# Patient Record
Sex: Female | Born: 1990 | Race: Black or African American | Hispanic: No | Marital: Single | Smoking: Current every day smoker
Health system: Southern US, Community
[De-identification: ages and names within clinical notes are randomized; demographics above are authoritative.]

## PROBLEM LIST (undated history)

## (undated) DIAGNOSIS — M1990 Unspecified osteoarthritis, unspecified site: Secondary | ICD-10-CM

## (undated) DIAGNOSIS — J45909 Unspecified asthma, uncomplicated: Secondary | ICD-10-CM

## (undated) DIAGNOSIS — Z973 Presence of spectacles and contact lenses: Secondary | ICD-10-CM

## (undated) DIAGNOSIS — N63 Unspecified lump in unspecified breast: Secondary | ICD-10-CM

## (undated) DIAGNOSIS — G43909 Migraine, unspecified, not intractable, without status migrainosus: Secondary | ICD-10-CM

## (undated) DIAGNOSIS — G43019 Migraine without aura, intractable, without status migrainosus: Secondary | ICD-10-CM

## (undated) DIAGNOSIS — F329 Major depressive disorder, single episode, unspecified: Secondary | ICD-10-CM

## (undated) DIAGNOSIS — Z8489 Family history of other specified conditions: Secondary | ICD-10-CM

## (undated) DIAGNOSIS — B019 Varicella without complication: Secondary | ICD-10-CM

## (undated) DIAGNOSIS — F32A Depression, unspecified: Secondary | ICD-10-CM

## (undated) HISTORY — DX: Migraine without aura, intractable, without status migrainosus: G43.019

## (undated) HISTORY — DX: Depression, unspecified: F32.A

## (undated) HISTORY — DX: Major depressive disorder, single episode, unspecified: F32.9

## (undated) HISTORY — DX: Unspecified asthma, uncomplicated: J45.909

## (undated) HISTORY — DX: Migraine, unspecified, not intractable, without status migrainosus: G43.909

## (undated) HISTORY — DX: Varicella without complication: B01.9

## (undated) HISTORY — PX: DILATION AND CURETTAGE OF UTERUS: SHX78

## (undated) NOTE — ED Provider Notes (Signed)
 Associated Order(s): Incision/Drainage Post-Procedure Diagnose(s): Paronychia of left thumb Formatting of this note is different from the original. Chief Complaint: Wound Dehiscence  Subjective   Patient is a 80 year old otherwise healthy female presenting with swelling to the right thumb.  Patient states she noticed some blistering and swelling just proximal to the nailbed of the right thumb.  She states that she has had increased swelling.  There is a little mild surrounding erythema.  Full range of motion into flexion extension of the thumb.   Past Medical History[1]  Past Surgical History[2] Family History[3]  Social History[4]  No Known Allergies  Review of Systems  Objective  Vitals   ED Triage Vitals BP: (!) 143/93 [09/03/24 0000] Heart Rate: 77 [09/03/24 0000] Respiratory Rate: 18 [09/03/24 0000] Temp: 36.7 C (98 F) [09/03/24 0000] Temp src: Oral [09/03/24 0000] SpO2: 100 % [09/03/24 0000] Weight: 51.1 kg (112 lb 10.5 oz) [09/03/24 0000] Height: 1.626 m (5' 4) [09/03/24 0006]  Physical Exam  Results Review  Assessment   Medical Decision Making:  Differential Diagnoses: Paronychia, cellulitis, flexor/extensor tenosynovitis  Physical exam is consistent with paronychia.  Incision and drainage was performed.  There was a large amount of pus.  Patient given prescription for Keflex.  Counseled to call primary care for follow-up and to return with new or worsening symptoms.  Patient is in agreement with this treatment plan                       I have reviewed all results of tests, assessments, and evaluations and incorporated results into my plan of care. All patient complaints have been evaluated and addressed in my plan of care. I have assessed whether each complaint is an Emergency Medical Condition and whether the Emergency Medical Condition has been stabilized or resolved.  Final diagnoses:  Paronychia of left thumb   Plan    Disposition:  Discharge I attest to having provided the patient with a Medical Screening Examination, including necessary testing or consultation, to determine whether an Emergency Medical Condition was present.  I further attest that any Emergency Medical Condition has been now stabilized or resolved with an appropriate discharge plan in place.    New Prescriptions   CEPHALEXIN (KEFLEX) 500 MG CAPSULE    Take 1 (one) capsule (500 mg total) by mouth 4 (four) times daily for 7 days.      Start Date: 10/26/2025End Date: 09/10/2024      Order Dose: 500 mg      Quantity: 28 capsule    Refills: 0     Additional Documentation  ED Lab and Imaging Orders   Incision/Drainage  Date/Time: 09/03/2024 12:17 AM  Performed by: Bernardo Rankin PARAS, DO Authorized by: Bernardo Rankin PARAS, DO   Consent:    Consent obtained:  Verbal   Consent given by:  Patient   Risks discussed:  Bleeding, incomplete drainage and infection   Alternatives discussed:  No treatment Universal protocol:    Site/side marked: yes     Immediately prior to procedure, a time out was called: yes     Patient identity confirmed:  Verbally with patient Location:    Type:  Abscess   Location:  Upper extremity   Upper extremity location:  Hand   Hand location:  L hand Pre-procedure details:    Skin preparation:  Povidone-iodine  Sedation:    Sedation type:  None Anesthesia:    Anesthesia method:  Topical application Procedure type:    Complexity:  Simple Procedure details:    Incision types:  Stab incision   Incision depth:  Dermal   Drainage:  Purulent   Drainage amount:  Moderate   Wound treatment:  Wound left open Post-procedure details:    Procedure completion:  Tolerated well, no immediate complications     [1] No past medical history on file. [2]  Past Surgical History: Procedure Laterality Date   Esophagogastroduodenoscopy Bilateral 05/19/2024   Dr. Jerl   Uterine fibroid surgery    [3] History reviewed. No pertinent  family history. [4]  Social History Tobacco Use   Smoking status: Never   Smokeless tobacco: Never  Substance Use Topics   Alcohol use: Never   Drug use: Never    Bernardo Rankin PARAS, DO 09/03/24 0018  Electronically signed by Bernardo Rankin PARAS, DO at 09/03/2024 12:18 AM CDT

## (undated) NOTE — Progress Notes (Signed)
 Formatting of this note might be different from the original. Received call from [facility] and was given a verbal order to DC NG tube.  Electronically signed by Flossie Gilberto LABOR, RN at 05/19/2024 10:56 AM CDT

## (undated) NOTE — Nursing Note (Signed)
 Formatting of this note might be different from the original. AVS reviewed with patient via audio/visual virtual visit. Pt alert and interactive with teaching. Discussed medications and follow up appointments with PCP & specialists as per AVS. Pt verbalized understanding, denies further questions regarding discharge.  Bedside nurse notified of above information. Electronically signed by Renda Alfonso RAMAN, RN at 05/21/2024  1:17 PM CDT

## (undated) NOTE — OR Surgeon (Signed)
 Formatting of this note might be different from the original. Endoscopic Gastroduodenoscopy Procedure Note  Name: Kaeleen Odom MRN: [id number]     CSN: [id number] DOB:  1991/07/03  Procedure date/time: 05/19/2024 1125  Admission Date/Time: 05/18/2024  6:29 AM Primary Care Provider / Referring Physician:  Patient, None Per  Surgeon / Proceduralist: Jennette Ireland, MD Assistant(s):  Sedation/Monitoring Nurse: Crecencio Child, RN Endoscopy Nurse: Hoy Asberry LABOR, RN  Preop / Preprocedure Diagnosis:  GASTRIC OUTLET OBSTRUCTION Postop / Postprocedure Diagnosis:  GE JUNCTION ULCERS  ASA Class: 1  Anesthesia Type:  Conscious Sedation: No Local  Mallampati scale :1  Medications:  Fentanyl  100 mcg    Midazolam  5 mg and Benzocaine spray   Brief history: Ms Liz is here for a upper endoscopy due to history of nausea and vomiting. A pre procedure physical exam was performed and it was within normal limits for the patient. His medications, allergies and medical history was reviewed before the procedure.   Procedure:  The procedure, risks, benefits and alternatives to the procedure are explained to the patient. Informed consent was obtained for the procedure, including conscious sedation. The complications explained included but not limited to infection, perforation, hemorrhage, adverse drug reaction to conscious sedation and aspiration.  Pre-procedure time out was performed. Patient was placed in left lateral position.The gastroscope was inserted into the mouth and advanced under direct vision to third portion of the duodenum.  A careful inspection was made as the gastroscope was withdrawn, including a retroflexed view; findings and interventions are described below. Appropriate photodocumentation was obtained. Findings are as follows:  Complications:  None; patient tolerated the procedure well.  Condition: stable. Pt was transferred to recovery area.   Findings:  Esophagus :  An ulcer  was noted at the GE junction. Pigmented base. No active bleed.. No strictures or Barrett's esophagus like mucosal changes are noted. No esophageal varices are noted.   GE junction:   GE Junction was noted at 38 cm . No hiatal hernia was noted. GE junction was also examined from the gastric end.   Stomach :   Normal stomach. No outlet obstruction.  Antrum and Body of the stomach was thoroughly examined. Retroflexed view was performed and fundus and gastric cardia appeared normal. No gastric varices are noted.   Duodenum :    No ulcers, erosions or mucosal changes suggestive of celiac disease are noted.     Estimated Blood Loss:  None  Blood Products: None  Specimens: None  Recommendations:    PPI daily  Advance diet  Can discharge home  Follow up as OP in 2 months   Nagendra V Myneni, MD [facility] 05/19/2024 12:04 PM  Electronically signed by Myneni, Nagendra V, MD at 05/19/2024 12:09 PM CDT

## (undated) NOTE — Discharge Summary (Signed)
 Formatting of this note is different from the original. Admit date: 05/18/2024 Discharge date: 05/21/2024 Disposition: Home  Summary of Hospitalization  Reason for Hospitalization: Abdominal pain  Diagnoses:  Principal Problem:   Acute abdominal pain Active Problems:   Nausea   SIRS (systemic inflammatory response syndrome)   Normocytic anemia   Bacterial vaginosis  Resolved Problems:   * No resolved hospital problems. [facility] Course: Hospital Course: 75 year old female admitted 05/18/2024 with nominal pain and exam concerning for gastric outlet obstruction.  GI was consulted.  EGD 05/19/2024 revealed an ulcer at the GE junction.  Patient treated with PPI. Diet was advanced.  Clinical status improved.  Patient was discharged home with a prescription for Protonix 40 mg daily  Procedures: EGD 05/19/2024 revealing an ulcer at the GE junction.  Discharge Plan Pending Results/Tests:   Recommendations/Follow Up: PCP in 3 to 5 days  Patient, None Per  Follow up   Discharge Medication List    New Medications      Sig  metroNIDAZOLE 500 MG tablet Commonly known as: FLAGYL  500 mg, Oral, 2 TIMES DAILY Quant: 14 tablet Refills: 0  pantoprazole 40 MG tablet Commonly known as: PROTONIX  40 mg, Oral, DAILY Quant: 90 tablet Refills: 0     Discharge Procedure Orders  Follow-up primary physician  Order Comments: PCP: Patient, None Per PCP Address: None PCP Phone Number: None   Order Specific Question Answer Comments  When? 3-5 days   After discharge from Hospital    Return to work/school  Order Comments: Kristen Ball was hospitalized from 05/18/2024 to 05/21/2024.  She may return to work on 05/23/2024.  If you have any questions or concerns, please don't hesitate to call.   Immunizations Administered for This Admission     No immunizations administered      Patient seen and examined on day of discharge. Notable exam findings / results:  GEN: NAD CV:  Regular rate and rhythm Pulm: CTAB Abdomen: Soft Extremities: No edema  Results Review  Additional Documentation   I spent 25 minutes coordinating the discharge of this patient.  Electronically signed by Floretta Toribio PARAS, DO at 05/21/2024 10:59 AM CDT

## (undated) NOTE — Progress Notes (Signed)
 Formatting of this note might be different from the original.  Subjective  Interval History: New patient.  Today.  Chart reviewed.  33 year old female admitted 05/18/2024 with nominal pain and exam concerning for gastric outlet obstruction.  GI was consulted.  EGD 05/19/2024 revealed an ulcer at the GE junction.  Patient treated with PPI.  Objective  Vitals  BP 110/79   Pulse 90   Temp 36.8 C (98.2 F)   Resp 12   Ht 162.6 cm (5' 4.02)   Wt 49.9 kg (110 lb)   SpO2 97%   BMI 18.87 kg/m   Physical Exam  General:, NAD CV: Regular rate and rhythm Pulm: CTAB Abd: Generalized tenderness to palpation Ext: No edema  Results Review  Assessment & Plan  Principal Problem:   Acute abdominal pain Active Problems:   Nausea   SIRS (systemic inflammatory response syndrome)   Normocytic anemia   Bacterial vaginosis  Acute abdominal pain, nausea: GI consulted and performed EGD, which revealed ulcer at the gastroesophageal junction.  Continue PPI.  Advance diet.  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 No bleeding symptoms.  This is reportedly near her baseline.  Iron studies 7/11 with iron saturation of 4.  Will order IV Venofer.  Abnormal UA: +LE, WBC on UA. Contaminated sample, many squamous epithelial cells. No urinary symptoms. Not consistent with UTI   Medical readiness for discharge: anticipated 2-4 days due to need to demonstrate clinical improvement, advance diet, treat the iron deficiency.  Additional Documentation    Electronically signed by Floretta Toribio PARAS, DO at 05/19/2024 12:17 PM CDT

## (undated) NOTE — Sedation Documentation (Signed)
 Formatting of this note is different from the original. Procedural Pre-Sedation Assessment  Vital Signs:  Blood pressure 125/77, pulse 87, temperature 36.8 C (98.2 F), resp. rate 16, height 162.6 cm (5' 4.02), weight 49.9 kg (110 lb), SpO2 100%. Body mass index is 18.87 kg/m.   NPO Duration:  8 hours  Meds: Current Meds:  Current Medications[1]   Allergies: Patient has no known allergies.   Pre-Procedure Assessment: Epic documentation is presently reviewed to include; Pre-procedure Nursing Assessment, Problem List, Medication List, Allergy List, PMH, PSH, H&P, Interval H&P, and recent laboratory/radiology assessment.  Exam:   Airway: Normal Heart: See H&P Lungs: See H&P  Mallampati Classification (click on link to view picture) Mallampati Class:  I (soft palate, uvula, fauces, and tonsillar pillars visible)   ASA Physical Status Classification:  ASA 2 - A patient with a mild systemic disease  Medication to be Used for Procedural Sedation:  fentaNYL  (SUBLIMAZE ) injection IV and midazolam  HCl (VERSED ) injection IV  Sedation Plan:    I have discussed in detail the anesthetic plan, planned medications, alternatives, procedures and risks (including (as appropriate) monitoring, pain management, post-op care, blood transfusions, adverse reactions, MI, CVA, organ damage and death.) All questions were answered. The patient, legal guardian or responsible adult consents to proceed.  Final Assessment: I have interviewed and examined the patient as documented within the EHR.  I have reviewed the pre-procedure assessment.  There are no significant changes that affect the risk of the planned procedure.  The chart has been reviewed and patient reassessed immediately prior to the procedure.  Nagendra V Myneni, MD  05/19/2024 11:36 AM     [1]  Current Facility-Administered Medications  Medication Dose Route Frequency Provider Last Rate Last Admin   0.9%  NaCl infusion   Intravenous  Continuous Myneni, Nagendra V, MD 50 mL/hr at 05/19/24 1119 New Bag at 05/19/24 1119   acetaminophen  (TYLENOL ) tablet  650 mg Oral Q6H PRN Krcil, Autumn L, DO   650 mg at 05/18/24 1454   HYDROmorphone  (DILAUDID ) injection  0.2-0.5 mg Intravenous Q3H PRN Krcil, Autumn L, DO   0.5 mg at 05/18/24 1602   lactated ringers  infusion   Intravenous Continuous Krcil, Autumn L, DO   Stopped at 05/19/24 1047   magnesium hydroxide (MILK OF MAGNESIA) 400 MG/5ML suspension  30 mL Oral BID PRN Krcil, Autumn L, DO       melatonin tablet  3 mg Oral Nightly PRN Krcil, Autumn L, DO       metroNIDAZOLE (FLAGYL) tablet  500 mg Oral BID Krcil, Autumn L, DO   500 mg at 05/19/24 0850   naloxone (NARCAN) injection  0.1-0.4 mg Intravenous PRN Krcil, Autumn L, DO       ondansetron  (ZOFRAN -ODT) disintegrating tablet  4 mg Oral Q8H PRN Krcil, Autumn L, DO   4 mg at 05/18/24 1826   Or   ondansetron  HCl (ZOFRAN ) injection  4 mg Intravenous Q8H PRN Krcil, Autumn L, DO   4 mg at 05/18/24 1900   pantoprazole (PROTONIX) injection  40 mg Intravenous Daily Bartlett, Kathryn B, DO   40 mg at 05/19/24 0850   phenol (CHLORASEPTIC) 1.4 % liquid  1 spray Mouth/Throat Q1H PRN Krcil, Autumn L, DO   1 mL at 05/18/24 2107   polyethylene glycol (GLYCOLAX) packet  17 g Oral Daily PRN Krcil, Autumn L, DO       prochlorperazine (COMPAZINE) tablet  10 mg Oral Q6H PRN Krcil, Autumn L, DO  Or   prochlorperazine (COMPAZINE) injection  10 mg Intravenous Q6H PRN Krcil, Autumn L, DO       Electronically signed by Myneni, Nagendra V, MD at 05/19/2024 11:36 AM CDT

## (undated) NOTE — Nursing Note (Signed)
 Formatting of this note might be different from the original. Admission questions reviewed with patient via audio/visual virtual visit. Pt had eyes closed and was mumbling with admission questions. Verified pt's emergency contacts & phone numbers. Education regarding fall precautions, room orientation, hours of cafeteria, meal ordering, and use of hand held & side rail call lights given. Pt updated on plan of care per last physician note.  Pt denies questions at this time.  Bedside nurse notified.    Has a PCP in [place] . She is in [place]  for work and is returning to [place]  in October this year. Pt independent with ADLs and uses no assistive devices. Pt has glasses back at her apartment. Lives in an apartment alone in [place]  and in a house with her sister in [place] . Has a support system and receives no home services. Pt endorses financial and transportation difficulty. Electronically signed by Bari Isaiah BROCKS, RN at 05/18/2024  3:17 PM CDT

## (undated) NOTE — Progress Notes (Signed)
 Formatting of this note is different from the original. GI Progress Note   Subjective  Patient is hungry  Objective   Blood pressure 103/64, pulse 70, temperature 36.5 C (97.7 F), temperature source Oral, resp. rate 18, height 162.6 cm (5' 4.02), weight 49.9 kg (110 lb), SpO2 98%.  Admit Weight: Weight: 49.9 kg (110 lb)  Today's Weight: Weight: 49.9 kg (110 lb)   Weight Change Since Admission: Weight change:  Physical Exam    General Well developed, well nourished, no acute distress   Lungs Clear to auscultation, no crackles, rhonchi, or wheezes   Heart Normal S1 S2, no murmurs, clicks, or gallops   Abdomen Soft, non-tender, non-distended, no palpable HSM or masses, + bowel sounds   Musculoskeletal Appears to have normal range of motion x 4 extremities, no joint swelling. No pedal edema       Diagnostics    Imaging:    No results found for: INR Lab Results  Component Value Date   WBC 10.71 05/20/2024   RBC 3.37 (L) 05/20/2024   HGB 9.5 (L) 05/20/2024   HCT 29.6 (L) 05/20/2024   MCV 87.8 05/20/2024   RDW 12.3 05/20/2024   PLT 200 05/20/2024   Lab Results  Component Value Date   ALT 8 05/19/2024   AST 14 05/19/2024   ALKPHOS 80 05/19/2024   BILITOT 1.1 05/19/2024   No results found for: AMYLASE  Assessment and Plan  She is staying in to get her iron infusion through tomorrow. I am okay with a general diet. Needs d/c on pantoprazole 40mg  daily. She said she will probably be going back to [place] after this so I advised that she see GI in the next 2 months as she needs a repeat EGD to make sure the esophagitis has healed.  Debby LITTIE Lunger, DO 05/20/2024 1:50 PM   Electronically signed by Lunger Debby LITTIE, DO at 05/20/2024  1:55 PM CDT

## (undated) NOTE — ED Notes (Signed)
 Formatting of this note might be different from the original. Patient verbalizes understanding of d/c instructions et f/u care. All questions addressed. Ambulatory at time of d/c. To transport self home.   Electronically signed by Sharmon Rosemary PARAS, RN at 09/03/2024 12:33 AM CDT

## (undated) NOTE — H&P (Signed)
 Formatting of this note is different from the original. Chief Complaint: Abdominal Pain   Subjective  HPI:  6 year old female with unremarkable past medical history who presents to the ED today for acute onset of abdominal pain.  She reports the pain started yesterday.  Has persisted with worsening today.  She has felt very nauseous, although has not had any vomiting.  She gestures to her middle upper abdomen when asked to localize.  No diarrhea.  States that she had a temperature of 100.5 F yesterday.  Nobody around her has been ill.  Returned from a trip to [place]  about 1 week ago, did not eat or drink anything unusual for patient.  Currently, she is not having abdominal pain.  States that she has felt better since the NG was placed.  Notably she has a history of recurrent BV; states that she has chronic vaginal discharge, which has been worse for the past week or 2.  She correlates this with 1 specific sexual partner.  Has been treated with both topical and systemic antibiotics in the past.  She cannot quantify how many times per year she gets treatment, or when her last treatment was.  Tells me she has seen an OB/GYN for this in the past.  She prefers oral treatment if possible.  Brief ED course: Hemodynamically stable and afebrile in the ED.  Labs are remarkable for BC 12.60, Hb 11.5, positive BV on vaginitis panel.  No vomiting in the ED.  CT A/P obtained, demonstrating moderate distention of the stomach consistent with partial gastric outlet obstruction or gastroparesis.  GI was consulted.  NG was placed in the ED.  Internal medicine was called for admission.  Problem List[1] Past Medical History[2] Past Surgical History[3] Family History[4] Social History[5] Current Medications[6]   Allergies[7]   Review of Systems  See above for pertinent findings.  Objective  Vitals   BP 113/79   Pulse 88   Temp 37.1 C (98.7 F) (Oral)   Resp 16   Ht 162.6 cm (5' 4)   Wt 49.9 kg (110 lb)    SpO2 99%   BMI 18.88 kg/m   Physical Exam GEN: Vitals reviewed.  Patient is in no acute distress. HEENT: Normocephalic atraumatic.  Pupils equally round.  Oropharynx clear.  NG in place NECK: Supple; no thyromegaly.   CV: Heart regular in rate and rhythm LUNGS: Lungs clear to auscultation bilaterally. ABD: Soft, mild upper abdominal tenderness to palpation, and nondistended.  Normoactive bowel sounds.  SKIN: Warm and dry to touch.  No rash. EXT: No clubbing or cyanosis.  No peripheral edema.   PSYCH: Affect appropriate.  Alert and oriented to person, place, and time.  Results Review Labs : reviewed, notable for WBC 12.60, Hb 11.5, Tbili 1.4, positive BV on vaginitis panel   CT Abdomen and Pelvis w Contrast Result Date: 05/18/2024 The stomach is moderately distended with oral contrast. Only a small amount of oral contrast has emptied into the small bowel. These findings may be due to partial gastric outlet obstruction or gastroparesis.  ELECTRONICALLY SIGNED BY: Erika FABIENE Hoard, DO 05/18/2024  10:27  Assessment & Plan  Principal Problem:   Acute abdominal pain Active Problems:   Nausea   SIRS (systemic inflammatory response syndrome)   Normocytic anemia   Bacterial vaginosis    Acute abdominal pain, nausea: C/T abd as detailed above, concerning for partial gastric outlet obstruction or gastroparesis.  -GI consulted. Note recommendation for antiemetics with consideration of NG if vomiting. However,  NG already placed in ED. Will maintain for now.  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 Suspect this is more reactive 2/2 abdominal pain and possible  gastric outlet obstruction. +BV although no other infectious source identified and would not expect BV to cause sepsis. Tx of BV as above, no other antibiotics indicated.    Normocytic anemia: Hb 11.4 on admission. No bleeding symptoms. Per review of CareEverywhere, this is consistent with previous Hb readings. Will check iron studies w/ am labs tomorrow.    Abnormal UA: +LE, WBC on UA. Contaminated sample, many squamous epithelial cells. No urinary symptoms. Not consistent with UTI   Inpatient/Observation status: Inpatient: Medical readiness for discharge: anticipated 2-4 days due to possible gastric outlet obstruction, pending GI evaluation/EGD  VTE Prophylaxis: None, low risk Diet: Diet NPO Except Except for: SIPS WITH MEDS Code Status:  Full Code Primary Care Provider:  Patient, None Per  Lionel LITTIE Dubin, DO 05/18/2024 11:48 AM  Additional Documentation       [1]  Patient Active Problem List Diagnosis   Acute abdominal pain   Nausea   SIRS (systemic inflammatory response syndrome)   Normocytic anemia   Bacterial vaginosis  [2] No past medical history on file. [3] History reviewed. No pertinent surgical history. [4] History reviewed. No pertinent family history. [5]   [6]  No current facility-administered medications on file prior to encounter.   No current outpatient medications on file prior to encounter.  [7] No Known Allergies  Electronically signed by Krcil, Autumn L, DO at 05/18/2024  2:01 PM CDT

## (undated) NOTE — Progress Notes (Signed)
 Formatting of this note might be different from the original.  Subjective  No new issues overnight.  Patient still on clear liquid diet this morning.  Objective  Vitals  BP 103/64   Pulse 70   Temp 36.5 C (97.7 F) (Oral)   Resp 18   Ht 162.6 cm (5' 4.02)   Wt 49.9 kg (110 lb)   SpO2 98%   BMI 18.87 kg/m   Physical Exam  General:, NAD CV: Regular rate and rhythm Pulm: CTAB Abd: Generalized tenderness to palpation Ext: No edema  Results Review  Assessment & Plan  Principal Problem:   Acute abdominal pain Active Problems:   Nausea   SIRS (systemic inflammatory response syndrome)   Normocytic anemia   Bacterial vaginosis  Acute abdominal pain, nausea: GI consulted and performed EGD, which revealed ulcer at the gastroesophageal junction.  Continue PPI.  Advancing diet.  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 No bleeding symptoms.  This is reportedly near her baseline.  Iron studies 7/11 with iron saturation of 4.  Will order IV Venofer.  Abnormal UA: +LE, WBC on UA. Contaminated sample, many squamous epithelial cells. No urinary symptoms. Not consistent with UTI   Discharge planning-hopeful discharge tomorrow if she tolerates advancement of the diet. Additional Documentation    Electronically signed by Floretta Toribio PARAS, DO at 05/20/2024 12:36 PM CDT

## (undated) NOTE — ED Notes (Signed)
 Formatting of this note might be different from the original. Report attempted to be called to the floor.  Electronically signed by Larry Charmaine CROME, RN at 05/18/2024  2:04 PM CDT

## (undated) NOTE — Progress Notes (Signed)
 Formatting of this note is different from the original. Medication Reconciliation per Pharmacy Medication reconciliation was performed on Kristen Ball, a 24 y.o., female  Spoke to patient to obtain a list of medications patient is currently taking.   Medication list has been reviewed and updated with the following to note:  OF NOTE: - Patient takes no routine prescription or OTC medications, vitamins, or supplements  Patient's home pharmacy has been updated in Epic.  Patient reports the following allergies: Patient has no known allergies.  Immunization History  Administered Date(s) Administered   Influenza (Flulaval) IIV3, Prefilled Syringe, 6 MONTHS AND OLDER 08/23/2023   Current Home Medication List:  Prior to Admission medications  Not on File    A pharmacist will remain available to provide clarification or further discussion with patient.  Josette MARLA Ore, PharmD  05/18/2024  11:57 AM  Electronically signed by Ore Josette MARLA, PharmD at 05/18/2024 11:58 AM CDT

## (undated) NOTE — ED Notes (Signed)
 Formatting of this note might be different from the original. Coa and self pay done Electronically signed by Mariano Macario HERO at 09/03/2024 12:30 AM CDT

## (undated) NOTE — ED Provider Notes (Signed)
 Formatting of this note is different from the original. Images from the original note were not included.   [facility] Emergency Department   Patient Name: Kristen Ball Patient DOB: 06-18-1991  Attending Provider: Lamarr KATHEE Sequin, DO  Room: [id number]  Primary Care Provider: Patient, None Per Date: 05/18/2024   Chief Complaint   Chief Complaint  Patient presents with   Abdominal Pain    Severe upper abd pain, my whole stomach.  Pain started yesterday afternoon.  Pt also reports vaginal discharge x1 wk with hx of bacterial infections.   History of Present Illness   Mode of Arrival: private car  Historian(s): patient  Treatment PTA: none  The patient is a 81 y.o. female with history of uterine fibroids status post fibroidectomy, recurrent BV who presents to emergency department with the chief complaint of upper abdominal pain.  Patient states she has had periumbilical pain that started yesterday.  Patient notes nausea without vomiting.  Patient noted her temperature yesterday spiked to 100.5.F.  Patient returned from [place]  1 week ago and mostly stated the house did not have any outside activity or fresh water  exposures.  Patient denies any sick contacts.  Patient had a normal bowel movement this morning.  Patient has chronic vaginal discharge and history of BV.  Patient is sexually active however not overly concerned about STDs.  Patient endorses history of fibroidectomy.  Pain has now moved to her upper abdomen.  Patient denies any new vaginal discharge.  Review of Systems   Review of Systems  Constitutional, eyes, ENT, pulmonary, cardiovascular, gastrointestinal, genitourinary, musculoskeletal, neurologic, skin, and psychiatric systems were reviewed and negative unless indicated in the HPI above.  Patient Medical History  Past Medical History[1] Past Surgical History[2] Family History[3] Social History   Socioeconomic History   Marital status:  Single    Spouse name: Not on file   Number of children: Not on file   Years of education: Not on file   Highest education level: Not on file  Occupational History   Not on file  Tobacco Use   Smoking status: Not on file   Smokeless tobacco: Not on file  Substance and Sexual Activity   Alcohol use: Never   Drug use: Not on file   Sexual activity: Not on file  Other Topics Concern   Not on file  Social History Narrative   Not on file   Social Drivers of Health   Financial Resource Strain: High Risk (05/18/2024)   Overall Financial Resource Strain (CARDIA)    Difficulty of Paying Living Expenses: Very hard  Food Insecurity: Food Insecurity Present (05/18/2024)   Hunger Vital Sign    Worried About Running Out of Food in the Last Year: Sometimes true    Ran Out of Food in the Last Year: Sometimes true  Transportation Needs: No Transportation Needs (05/18/2024)   PRAPARE - Administrator, Civil Service (Medical): No    Lack of Transportation (Non-Medical): No  Physical Activity: Not on file  Stress: Not on file  Social Connections: Unknown (03/24/2022)   Received [facility]   Social Network    Social Network: Not on file  Intimate Partner Violence: Not At Risk (05/18/2024)   Humiliation, Afraid, Rape, and Kick questionnaire    Fear of Current or Ex-Partner: No    Emotionally Abused: No    Physically Abused: No    Sexually Abused: No  Housing Stability: High Risk (05/18/2024)   Housing Stability Vital  Sign    Unable to Pay for Housing in the Last Year: Yes    Number of Times Moved in the Last Year: 2    Homeless in the Last Year: No   Allergies[4]  Current Medication List: There are no discharge medications for this patient.  Physical Examination  BP 118/81   Pulse 110   Temp 38.1 C (100.6 F) (Oral)   Resp 17   Ht 162.6 cm (5' 4.02)   Wt 49.9 kg (110 lb)   SpO2 98%   BMI 18.87 kg/m  No LMP recorded.    Physical Exam Constitutional:       General: She is not in acute distress.    Appearance: She is not diaphoretic.  HENT:     Head: Normocephalic and atraumatic.  Neck:     Trachea: No tracheal deviation.   Cardiovascular:     Rate and Rhythm: Normal rate.     Heart sounds: Normal heart sounds. No murmur heard.    No friction rub.  Pulmonary:     Effort: Pulmonary effort is normal. No respiratory distress.     Breath sounds: Normal breath sounds. No wheezing.  Abdominal:     General: There is no distension.     Palpations: Abdomen is soft.     Tenderness: There is generalized abdominal tenderness and tenderness in the right upper quadrant and epigastric area. There is no guarding.  Genitourinary:    Comments: Patient declined pelvic exam  Musculoskeletal:        General: Normal range of motion.     Cervical back: Normal range of motion and neck supple.     Comments: No gross deformities.   Skin:    General: Skin is warm and dry.   Neurological:     Mental Status: She is alert and oriented to person, place, and time.     Comments: Symmetric face. Normal speech.   Data Reviewed   Nursing Notes:  Appreciated, reviewed, and utilized the nursing notes.   Orders Placed in ED: Orders Placed This Encounter  Procedures   Vaginitis Panel =>14   GC/Chlamydia by NAA   Urine culture   CT Abdomen and Pelvis w Contrast   XR Abdomen 1 View   CBC and differential   Comprehensive metabolic panel   Lipase   Urinalysis with reflex microscopic & reflex to culture if indicated (will only reflex to culture if >10 WBC's)   hCG, Qualitative, Urine   Troponin T 5th Gen   Troponin T 5th Gen   Comprehensive metabolic panel   Magnesium   Phosphorus   CBC and differential   Iron and TIBC   Ferritin   Diet NPO Except Except for: SIPS WITH MEDS   Insert Saline Lock   Patient does meet criteria for pregnancy test   Patient DOES NOT Meet Criteria   Vital signs   Intake and output   Up ad lib   Notify physician of  abnormal vital signs   Nasogastric tube to suction   Procedure communication Esophagogastroduodenoscopy under sedation with Dr. Myneni   Nursing consult to Virtual Nurse   Full code   Inpatient consult to GI   EKG 12 lead   Admit to Inpatient   Bed Request   Laboratory Studies: Ordered, reviewed, and independently interpreted by the ED physician Ordered and independently reviewed and interpreted laboratory tests.  Abnormal findings are listed below:  Results for orders placed or performed during the hospital encounter of 05/18/24  CBC and differential   Collection Time: 05/18/24  6:41 AM  Result Value Ref Range   WBC 12.60 (H) 4.00 - 11.00 10*3/uL   RBC 3.96 (L) 4.20 - 5.20 10*6/uL   HGB 11.5 (L) 12.0 - 16.0 g/dL   HCT 65.3 (L) 62.9 - 52.9 %   MCV 87.4 81.0 - 98.0 fL   MCH 29.0 27.0 - 34.0 pg   MCHC 33.2 31.5 - 36.0 g/dL   Platelets 762 849 - 549 10*3/uL   RDW-CV 12.3 9.0 - 14.5 %   NRBC Absolute 0.00 0.0 10*3/uL   MPV 11.0 8.7 - 12.6 fL   Differential Type AUTO    Basophils Absolute 0.03 0.00 - 0.10 10*3/uL   Eosinophils Absolute Count 0.04 0.00 - 0.50 10*3/uL   Lymphocytes Absolute 0.76 (L) 1.20 - 4.00 10*3/uL   Monos Absolute 0.66 0.00 - 1.00 10*3/uL   Neutrophils Absolute 11.07 (H) 1.50 - 8.00 10*3/uL   Basophils % 0.2 %   Eosinophils Relative  % 0.3 %   Immature Granulocytes Absolute 0.04 0.00 - 0.09 10*3/uL   Immature Granulocytes% 0.3 0.0 - 0.6 %   Lymphocytes % 6.0 %   Monocyte % 5.2 %   Neutrophil % 88.0 %  Comprehensive metabolic panel   Collection Time: 05/18/24  6:41 AM  Result Value Ref Range   Sodium 135 (L) 136 - 145 mmol/L   Potassium 4.2 3.5 - 5.1 mmol/L   Chloride 98 98 - 107 mmol/L   CO2 24 22 - 29 mmol/L   Glucose 121 (H) 70 - 99 mg/dL   BUN 6 6 - 20 mg/dL   Creatinine 9.41 (L) 0.6 - 1.1 mg/dL   Calcium 9.4 8.5 - 89.4 mg/dL   Total Protein 7.9 6.4 - 8.3 g/dL   Albumin 4.4 3.5 - 5.2 g/dL   Bilirubin Total 1.4 (H) 0.4 - 1.3 mg/dL   Alkaline  Phosphatase 89 35 - 104 U/L   AST 34 8 - 43 U/L   ALT 11 <33 U/L   Anion Gap 13 mmol/L   BUN/Creatinine Ratio 10.3    Osmolality Calculated 279 275 - 295 mosm/kg   Globulin 3.5 g/dL   A/G Ratio 1.3    Creatinine Based eGFR 122 mL/min/[1.73_m2]  Lipase   Collection Time: 05/18/24  6:41 AM  Result Value Ref Range   Lipase 19 13 - 60 U/L  Troponin T 5th Gen   Collection Time: 05/18/24  6:41 AM  Result Value Ref Range   Troponin T 5th GEN <6 <11 ng/L  Urinalysis with reflex microscopic & reflex to culture if indicated (will only reflex to culture if >10 WBC's)   Collection Time: 05/18/24  6:50 AM  Result Value Ref Range   Specimen, UA CLN CATCH    Color, UA YELLOW YELLOW^YELLOW   Clarity, UA CLOUDY (A) CLEAR^CLEAR   Glucose, UA NEG NEG^NEG mg/dL   Bilirubin, UA NEG NEG^NEG   Ketones, UA 15 (A) NEG^NEG mg/dL   Specific Gravity, UA 1.015 1.005 - 1.030   Blood, UA TRACE (A) NEG^NEG   PH, UA 8.0 5.0 - 8.0   Protein, UA TRACE (A) NEG^NEG mg/dL   Urobilinogen,UA LESS THAN 2 LT2^LESS THAN 2 [Ehrlich'U]/dL   Nitrite, UA NEG NEG^NEG   Leukocyte Esterase, UA MODERATE (A) NEG^NEG   WBC >50 (H) 0 - 10 /[HPF]   RBC 3 (H) 0 - 2 /[HPF]   Bacteria,UR FEW /[HPF]   Squam Epithel, UA MANY >10 (A) F05^FEW (0-5) /[LPF]  hCG, Qualitative, Urine   Collection Time: 05/18/24  6:50 AM  Result Value Ref Range   HCG,UR NEG NEG^NEG  EKG 12 lead   Collection Time: 05/18/24  6:54 AM  Result Value Ref Range   Heart Rate 98 bpm   PR Interval 171 ms   Frontal Axis:P 69 deg   QRS Duration 77 ms   QT Interval 397 ms   QT Corrected 507 ms   Frontal Axis: Mean QRS -20 deg   Frontal Axis: Terminal 40 ms. 6 deg   Frontal Axis Terminal 40ms. -43 deg   Frontal Axis:T -6 deg   Frontal Axis: ST 85 deg  Vaginitis Panel =>14   Collection Time: 05/18/24  7:20 AM   Specimen: Vaginal  Result Value Ref Range   Bacterial Vaginosis POS (A) NEG^NEG   Candida Species NOT DETECTED NDET^NOT DETECTED   Candida  glabrata-krusei NOT DETECTED NDET^NOT DETECTED   Trichomonas NOT DETECTED NDET^NOT DETECTED  Troponin T 5th Gen   Collection Time: 05/18/24  9:25 AM  Result Value Ref Range   Troponin T 5th GEN <6 <11 ng/L    Imaging Studies: Ordered, reviewed, and independently interpreted by the ED physician, please see formal radiology interpretation below. CT Abdomen and Pelvis w Contrast Result Date: 05/18/2024 CT ABDOMEN AND PELVIS WITH INTRAVENOUS CONTRAST  INDICATION: Abdominal pain.  COMPARISON: None.  TECHNIQUE: Multiple axial images of the abdomen and pelvis were performed following the uneventful administration of iopamidol (ISOVUE-300) 61 % injection 100 mL. Reformatted coronal and sagittal images were provided.  *Dose reduction techniques using the adjustment of the mA and/or kV according to patient size and/or use of AEC or iterative reconstruction were used in the acquisition of this exam.*  FINDINGS: The liver enhances homogenously. The portal vein is patent. No radiopaque cholelithiasis. The adrenal glands, spleen and pancreas are unremarkable. The abdominal aorta is normal in caliber.  The kidneys enhance homogenously. No urolithiasis or urinary obstruction. The partially distended urinary bladder is unremarkable. The uterus is not enlarged. Small amount of free fluid in the pelvis is probably physiologic.  No small bowel or colonic dilatation. The stomach is moderately distended with oral contrast. Only a small amount oral contrast has emptied into the nondilated small bowel. The colon is decompressed which limits assessment for wall thickening. No pneumoperitoneum. No acute osseous abnormality. The appendix is normal.    The stomach is moderately distended with oral contrast. Only a small amount of oral contrast has emptied into the small bowel. These findings may be due to partial gastric outlet obstruction or gastroparesis.  ELECTRONICALLY SIGNED BY: Erika FABIENE Hoard, DO 05/18/2024   10:27   EKG: Ordered, reviewed, and independently interpreted by the ED physician ECG Results     Procedure Component Value Ref Range Lab Date/Time   EKG 12 lead [[id number]] DML TRACE Collected: 05/18/24 0654   Order Status: Completed Lab Status: Final result Updated: 05/18/24 0725    Heart Rate 98 bpm DML TRACE     PR Interval 171 ms DML TRACE     Frontal Axis:P 69 deg DML TRACE     QRS Duration 77 ms DML TRACE     QT Interval 397 ms DML TRACE     QT Corrected 507 ms DML TRACE     Frontal Axis: Mean QRS -20 deg DML TRACE     Frontal Axis: Terminal 40 ms. 6 deg DML TRACE     Frontal Axis Terminal 40ms. -43 deg DML TRACE  Frontal Axis:T -6 deg DML TRACE     Frontal Axis: ST 85 deg DML TRACE    Narrative:     Sinus rhythm Borderline left axis deviation Low voltage, precordial leads Consider anterior infarct Prolonged QT interval Confirmed by: Bartlett, Katie B. 18-May-2024 07:22:39      Medical Decision Making   ED Provider Narrative Patient evaluated for abnormal CT of abdomen.  Patient is significantly tender and distended.  Patient found to have gastric outlet obstruction on CT of unclear significance or etiology.  Patient started on IV Protonix.  Patient's pain control with Dilaudid .  Patient requiring multiple antiemetics of IV and oral Zofran  as well as Reglan .  Patient did have one-time vomiting in the emergency department.  Discussed with GI who recommended NG tube to be placed.  Have ordered viscous lidocaine  to improve facilitation of procedure and patient verbalized understanding and agreement with this plan.  Patient subsequently mated with likely follow-up EGD pending improvement of the gastric distention.  Sources of History: Patient Record Review Summary: None on applicable Differential Diagnosis (Includes but not limited to): Appendicitis, pancreatitis, cholecystitis, bowel obstruction Chronic illnesses impacting care: None Social determinants of health  (SDH): Problems related to not applicable Social Drivers of Health with Concerns   Concerns Present  Financial Resource Strain: High Risk (05/18/2024)   Overall Financial Resource Strain (CARDIA)    Difficulty of Paying Living Expenses: Very hard  Housing Stability: High Risk (05/18/2024)   Housing Stability Vital Sign    Unable to Pay for Housing in the Last Year: Yes    Number of Times Moved in the Last Year: 2    Homeless in the Last Year: No  Food Insecurity: Food Insecurity Present (05/18/2024)   Hunger Vital Sign    Worried About Running Out of Food in the Last Year: Sometimes true    Ran Out of Food in the Last Year: Sometimes true  Tobacco Use: High Risk (10/30/2022)   Received from [facility]   Patient History    Smoking Tobacco Use: Every Day    Smokeless Tobacco Use: Never   Unknown Concern   Received from [facility]   Social Network   How SDH impacted patient's care: Not applicable Consideration of admission: Hospitalization considered necessary Prescriptions medications considered: See as above Independent results interpretation: Please see narrative above Discussion with providers: Please see narrative above   Sepsis/Stroke documentation: Not applicable    ED Course   ED Course as of 05/18/24 1628  Thu May 18, 2024  0655 Patient noted lower abdominal pain yesterday between 2:30-3 yesterday. +1 and kept worsening to a 5. Upper abdomen. Never happened before. Removed fibroids. + nausea, - 100.5. no diarrhea, normal bm this morning. No vomiting  Patient has history of BV has some discharge. Concern for STD. [place] in the last 30 days. And [place]  last week- no fresh water  streams. Patient reports bacon in am and for lunch and lemon muffins for lunch. Patient has not had anything since then because she thought a gas bubble. NoNSAIDS, no alcohol/drugs, No caffiene, energy drink. [KB]  9146 Vaginitis Panel =>14(!) Will treat with flagyl. [KB]  1137 Dr. Krcil will  admit. [KB]   ED Course User Index [KB] Bartlett, Kathryn B, DO   Medications given in the ED:   ED Medication Administration from 05/18/2024 0623 to 05/18/2024 1417       Date/Time Order Dose Route Action    05/18/2024 0649 CDT ondansetron  (ZOFRAN -ODT) disintegrating tablet -- Oral  See Alternative    05/18/2024 0649 CDT ondansetron  HCl (ZOFRAN ) injection 4 mg Intravenous Given    05/18/2024 1227 CDT lactated ringers  bolus 1,000 mL 0 mL Intravenous Infused    05/18/2024 0716 CDT lactated ringers  bolus 1,000 mL 1,000 mL Intravenous New Bag    05/18/2024 0714 CDT ketorolac  (TORADOL ) injection 15 mg Intravenous Given    05/18/2024 0715 CDT ondansetron  HCl (ZOFRAN ) injection 4 mg Intravenous Given    05/18/2024 0805 CDT HYDROmorphone  (DILAUDID ) injection 0.5 mg Intravenous Given    05/18/2024 1003 CDT iopamidol (ISOVUE-300) 61 % injection 100 mL Intravenous Given    05/18/2024 1003 CDT iohexol (OMNIPAQUE) 12 MG/ML oral solution 1,000 mL Oral Given    05/18/2024 1124 CDT metoclopramide  (REGLAN ) injection 10 mg Intravenous Given    05/18/2024 1126 CDT pantoprazole (PROTONIX) injection 40 mg Intravenous Given    05/18/2024 1123 CDT lidocaine  viscous (XYLOCAINE ) 2 % mouth solution 5 mL Mouth/Throat Given    05/18/2024 1356 CDT lactated ringers  infusion -- Intravenous New Bag    05/18/2024 1227 CDT HYDROmorphone  (DILAUDID ) injection 0.5 mg Intravenous Given    05/18/2024 1226 CDT lidocaine  viscous (XYLOCAINE ) 2 % mouth solution 5 mL Mouth/Throat Given    05/18/2024 1355 CDT metroNIDAZOLE (FLAGYL) IVPB 500 mg 0 mg Intravenous Infused    05/18/2024 1241 CDT metroNIDAZOLE (FLAGYL) IVPB 500 mg 500 mg Intravenous New Bag     Repeat Vitals: Vitals:   05/18/24 1426 05/18/24 1457 05/18/24 1554 05/18/24 1558  BP: 118/81     Pulse: 110     Resp:  18 17   Temp: 37.9 C (100.3 F)   38.1 C (100.6 F)  TempSrc: Oral   Oral  SpO2: 98%     Weight: 49.9 kg (110 lb)     Height: 162.6 cm (5' 4.02)       Reviewed the patient?s vital signs.  Procedure(s) and Critical Care   Procedures  Counseling   The emergency provider discussed today?s findings, in addition to providing specific details for the plan of care with patient. Questions are answered and there is agreement with the treatment plan. Counseling was provided regarding the diagnosis.  Impression and Disposition   CLINICAL IMPRESSION  1. Gastric outlet obstruction   2. Bacterial vaginosis     DISPOSITION Time: 4:28 PM Admit to med/surg floor  Prescriptions: There are no discharge medications for this patient.  Follow up: Patient, None Per  Follow up    This chart was generated using Dragon voice recognition computer software. Although every effort was made to ensure accuracy, inadvertent transcription errors may be present.    [1] History reviewed. No pertinent past medical history. [2] History reviewed. No pertinent surgical history. [3] History reviewed. No pertinent family history. [4] No Known Allergies   Jeppie Lamarr NOVAK, DO 05/18/24 1628  Electronically signed by Jeppie Lamarr B, DO at 05/18/2024  4:28 PM CDT

## (undated) NOTE — Consults (Signed)
 Formatting of this note is different from the original.  GI Consultation  Kristen Ball  [address] [place] [address] 56 y.o. female Admission Date/Time: 05/18/2024  6:29 AM  Code Status: No Order  Primary Care Provider:  Patient, None Per Provider Requesting Consultation: Dr. Jeppie [facility] Provider:not est Patient Staffed with:Dr. Myneni History  Reason for Consultation: Abd pain  HPI: This patient is a 43 y.o. female with PMH significant for uterine fibroid presented to the ED with abdominal pain. This started around 1500 yesterday and had nausea, low grade temp. No emesis. She has never had pain like this before. Had a normal BM today, no diarrhea, melena or blood. No previous EGD. No NSAID use. In [place] last week but denies any sick contacts. No family hx GI issues. Labs were significant for mild leukocytosis. CT A/P showed little contrast moving out from stomach concerning for delayed motility or GOO. Admitted to IM with GI consult.   GI Work-Up:  CT A/P 05/18/24 FINDINGS: The liver enhances homogenously. The portal vein is patent. No radiopaque cholelithiasis. The adrenal glands, spleen and pancreas are unremarkable. The abdominal aorta is normal in caliber.  The kidneys enhance homogenously. No urolithiasis or urinary obstruction. The partially distended urinary bladder is unremarkable. The uterus is not enlarged. Small amount of free fluid in the pelvis is probably physiologic.  No small bowel or colonic dilatation. The stomach is moderately distended with oral contrast. Only a small amount oral contrast has emptied into the nondilated small bowel. The colon is decompressed which limits assessment for wall thickening. No pneumoperitoneum. No acute osseous abnormality. The appendix is normal.  IMPRESSION: The stomach is moderately distended with oral contrast. Only a small amount of oral contrast has emptied into the small bowel. These findings may be due  to partial gastric outlet obstruction or gastroparesis.  Labs and Imaging have been reviewed.   There are no active problems to display for this patient.  Past Medical History[1] Past Surgical History[2]  Social History   Social History Narrative   Not on file   Social History   Socioeconomic History   Marital status: Single    Spouse name: Not on file   Number of children: Not on file   Years of education: Not on file   Highest education level: Not on file  Occupational History   Not on file  Tobacco Use   Smoking status: Not on file   Smokeless tobacco: Not on file  Substance and Sexual Activity   Alcohol use: Not on file   Drug use: Not on file   Sexual activity: Not on file  Other Topics Concern   Not on file  Social History Narrative   Not on file   Social Drivers of Health   Financial Resource Strain: Not on file  Food Insecurity: Not on file  Transportation Needs: Not on file  Physical Activity: Not on file  Stress: Not on file  Social Connections: Unknown (03/24/2022)   [facility] from [facility]   Social Network    Social Network: Not on file  Intimate Partner Violence: Unknown (02/13/2022)   Received from [facility]   HITS    Physically Hurt: Not on file    Insult or Talk Down To: Not on file    Threaten Physical Harm: Not on file    Scream or Curse: Not on file  Housing Stability: Not on file   Family History[3]  ALLERGIES/SENSITIVITIES: Allergies[4] Current wt  05/18/24 49.9 kg (  110 lb)   Medications Prior to Admission[5] Current Medications[6]   ROS Review of Systems    General Well built and well nourished   Respiratory Shortness of breath: No   Cardiac Chest pain: No   GI Abd. pain: yes, Nausea/vomiting: yes, Diarrhea/constipation: No, Melena/hematochezia: No   Neuro Seizures: No, Strokes: No   Psych Chem Dep: No   Endo Diabetes: No   Derm Jaundice: No   Heme Anemia: No   Vital Signs  Patient Vitals for the past 8 hrs:   BP Temp Temp src Pulse Resp SpO2 Height Weight  05/18/24 1102 113/79 -- -- 88 -- 99 % -- --  05/18/24 0930 136/77 -- -- 107 -- 99 % -- --  05/18/24 0915 -- -- -- 101 -- 99 % -- --  05/18/24 0902 119/74 -- -- 82 -- 97 % -- --  05/18/24 0900 119/74 -- -- 77 -- 96 % -- --  05/18/24 0845 -- -- -- 94 -- 98 % -- --  05/18/24 0830 109/76 -- -- 89 -- 97 % -- --  05/18/24 0815 -- -- -- 103 -- 98 % -- --  05/18/24 0800 110/75 -- -- 86 -- 100 % -- --  05/18/24 0752 -- -- -- -- 16 -- -- --  05/18/24 0745 -- -- -- 97 -- 98 % -- --  05/18/24 0730 103/72 -- -- 89 -- 99 % -- --  05/18/24 0724 -- 37.1 C (98.7 F) Oral -- -- -- -- --  05/18/24 0715 -- -- -- 102 -- 100 % -- --  05/18/24 0714 -- -- -- -- 16 -- -- --  05/18/24 0700 104/72 -- -- 105 -- 100 % -- --  05/18/24 0630 106/72 36.8 C (98.2 F) Tympanic 111 16 99 % 162.6 cm (5' 4) 49.9 kg (110 lb)   No intake/output data recorded. No intake/output data recorded. Physical Exam   Physical Exam    General Well developed, well nourished, no acute distress   Head Normocephalic   Neck Trachea is midline   Lungs Clear to auscultation   Heart Normal Rhythm     Abdomen Soft, tender, mildly distended, + bowel sounds   Musculoskeletal No pedal edema   Neuro Alert, orientedx3, No focal neurologic deficits noted.   Derm No pallor noted   Diagnostics  Laboratory:       CMP: Recent Labs    05/18/24 0641  NA 135*  K 4.2  CO2 24  CL 98  BUN 6  CREATININE 0.58*  GLU 121*  PROT 7.9  AST 34  ALT 11        CBC with Differential: Recent Labs    05/18/24 0641  WBC 12.60*  NEUTROABS 11.07*  BASOPCT 0.2  MONOSABS 0.66  RBC 3.96*  HGB 11.5*  HCT 34.6*  MCV 87.4  MCH 29.0  MCHC 33.2  RDW 12.3  PLT 237  MPV 11.0        Coags: No results for input(s): INR, PTT in the last 72 hours.      Liver Functions: Recent Labs    05/18/24 0641  ALKPHOS 89  ALT 11  AST 34  BILITOT 1.4*   Assessment  75F admitted with abd pain,  nausea CT with findings of GOO vs delayed motility  Plan  Antiemetics as needed If vomiting, schedule nausea meds. If persistent vomiting, could place NG for decompression but hold off for now IV PPI twice daily NPO Plan EGD tomorrow with Dr. Myneni  Will follow with further recommendations post procedure  Thank you for consultation and allowing our participation in this patient's care. If you have any questions please do not hesitate to reach us  at [phone].   Duwaine FORBES Nida, ARNP 05/18/2024 11:31 AM    [1] History reviewed. No pertinent past medical history. [2] History reviewed. No pertinent surgical history. [3] History reviewed. No pertinent family history. [4] No Known Allergies [5] [6]  Current Facility-Administered Medications:    ondansetron  (ZOFRAN -ODT) disintegrating tablet, 4 mg, Oral, Once PRN **OR** ondansetron  HCl (ZOFRAN ) injection, 4 mg, Intravenous, Once PRN, Correne Norleen HERO, MD, 4 mg at 05/18/24 0649   pantoprazole (PROTONIX) injection, 40 mg, Intravenous, Daily, Jeppie Collar B, DO, 40 mg at 05/18/24 1126 No current outpatient medications on file.  Electronically signed by Nida Duwaine FORBES, ARNP at 05/18/2024 12:24 PM CDT

## (undated) NOTE — ED Triage Notes (Signed)
 Formatting of this note might be different from the original. Patient presents to the ED by private car for c/o left thumb pain due to infection at the cuticle.  Electronically signed by Sharmon Rosemary PARAS, RN at 09/03/2024 12:03 AM CDT

---

## 2009-11-09 HISTORY — PX: DILATION AND CURETTAGE OF UTERUS: SHX78

## 2013-11-27 ENCOUNTER — Other Ambulatory Visit: Payer: Self-pay | Admitting: Obstetrics and Gynecology

## 2013-11-27 DIAGNOSIS — N6320 Unspecified lump in the left breast, unspecified quadrant: Secondary | ICD-10-CM

## 2013-12-01 ENCOUNTER — Ambulatory Visit
Admission: RE | Admit: 2013-12-01 | Discharge: 2013-12-01 | Disposition: A | Discharge status: Home / Self Care | Payer: BC Managed Care – PPO | Source: Ambulatory Visit | Attending: Obstetrics and Gynecology | Admitting: Obstetrics and Gynecology

## 2013-12-01 DIAGNOSIS — N6320 Unspecified lump in the left breast, unspecified quadrant: Secondary | ICD-10-CM

## 2014-01-01 ENCOUNTER — Telehealth: Payer: Self-pay | Admitting: Family Medicine

## 2014-01-01 NOTE — Telephone Encounter (Signed)
Called and spoke with pt and appt is scheduled for 8 45 am tomorrow.  Pt is aware to come in at 830.

## 2014-01-01 NOTE — Telephone Encounter (Signed)
Pt is trying to get established as a new pt with dr. Maudie Mercury, however she needs a f/u appt as soon as possible, pt was seen at an urgent care in Jan, and states she has been sick since. Pt states she is having stomach issues, body aches, sore throat.Madaline Brilliant to schedule?

## 2014-01-02 ENCOUNTER — Ambulatory Visit (INDEPENDENT_AMBULATORY_CARE_PROVIDER_SITE_OTHER): Payer: BC Managed Care – PPO | Admitting: Family Medicine

## 2014-01-02 ENCOUNTER — Encounter: Payer: Self-pay | Admitting: Family Medicine

## 2014-01-02 VITALS — BP 102/88 | HR 88 | Temp 98.9°F | Ht 63.75 in | Wt 112.0 lb

## 2014-01-02 DIAGNOSIS — J45909 Unspecified asthma, uncomplicated: Secondary | ICD-10-CM

## 2014-01-02 DIAGNOSIS — H109 Unspecified conjunctivitis: Secondary | ICD-10-CM

## 2014-01-02 DIAGNOSIS — J45998 Other asthma: Secondary | ICD-10-CM

## 2014-01-02 DIAGNOSIS — J329 Chronic sinusitis, unspecified: Secondary | ICD-10-CM

## 2014-01-02 MED ORDER — ALBUTEROL SULFATE HFA 108 (90 BASE) MCG/ACT IN AERS: 2.0000 | INHALATION_SPRAY | Freq: Four times a day (QID) | RESPIRATORY_TRACT | Status: DC | PRN

## 2014-01-02 MED ORDER — FLUTICASONE PROPIONATE HFA 44 MCG/ACT IN AERO: 2.0000 | INHALATION_SPRAY | Freq: Two times a day (BID) | RESPIRATORY_TRACT | Status: DC

## 2014-01-02 MED ORDER — SULFACETAMIDE SODIUM 10 % OP SOLN: 1.0000 [drp] | OPHTHALMIC | Status: DC

## 2014-01-02 MED ORDER — AMOXICILLIN 875 MG PO TABS: 875.0000 mg | ORAL_TABLET | Freq: Two times a day (BID) | ORAL | Status: DC

## 2014-01-02 NOTE — Progress Notes (Signed)
Chief Complaint  Patient presents with  . Establish Care  . Conjunctivitis  . URI  . Sore Throat    HPI:  Kristen Ball is here to establish care. Recently moved to [place] from [place]. Last PCP and physical: Sees Dr. Gertie Ball in gyn for yearly physicals  Has the following chronic problems and concerns today:  There are no active problems to display for this patient.  URI: -started about 3 weeks ago -started as fever, nausea and vomiting, body aches - was getting a lot better but now sinuses have gotten worse and R max sinus pain is present, today has pink eye in r eye per her report -denies: fever, visual loss, pain in eye, purulent discharge from eye, increased SOB  Asthma: -used to take advair but stopped -recently having symptoms every day since moved to [place] -has not been hospitalized for asthma, did require prednisone a few months ago  Health Maintenance:  ROS: See pertinent positives and negatives per HPI.  Past Medical History  Diagnosis Date  . Asthma   . Migraines   . Frequent headaches     Family History  Problem Relation Age of Onset  . Arthritis Mother   . Breast cancer Maternal Aunt   . Diabetes Paternal Grandmother   . Hypertension Paternal Grandfather     History   Social History  . Marital Status: Unknown    Spouse Name: N/A    Number of Children: N/A  . Years of Education: N/A   Social History Main Topics  . Smoking status: Current Some Day Smoker  . Smokeless tobacco: None     Comment: smokes once every 3 weeks  . Alcohol Use: Yes     Comment: occ  . Drug Use: No  . Sexual Activity: None   Other Topics Concern  . None   Social History Narrative   Work or School: works at post [employer] Situation: lives alone       Spiritual Beliefs: none      Lifestyle: no regular exercising; diet is poor             Current outpatient prescriptions:albuterol (PROVENTIL HFA;VENTOLIN HFA) 108 (90 BASE) MCG/ACT inhaler, Inhale  2 puffs into the lungs every 6 (six) hours as needed for wheezing or shortness of breath., Disp: 1 Inhaler, Rfl: 0;  amoxicillin (AMOXIL) 875 MG tablet, Take 1 tablet (875 mg total) by mouth 2 (two) times daily., Disp: 20 tablet, Rfl: 0 fluticasone (FLOVENT HFA) 44 MCG/ACT inhaler, Inhale 2 puffs into the lungs 2 (two) times daily., Disp: 1 Inhaler, Rfl: 3;  sulfacetamide (BLEPH-10) 10 % ophthalmic solution, Place 1 drop into both eyes every 3 (three) hours., Disp: 15 mL, Rfl: 0  EXAM:  Filed Vitals:   01/02/14 0854  BP: 102/88  Pulse: 88  Temp: 98.9 F (37.2 C)    Body mass index is 19.38 kg/(m^2).  GENERAL: vitals reviewed and listed above, alert, oriented, appears well hydrated and in no acute distress  HEENT: atraumatic, conjunttiva erythematous R eye, no purulent discharge, visual acuity grossly intact, EOMI, PERRLA, no obvious abnormalities on inspection of external nose and ears, ear canals and TMs normal, clear thick nasal congestion and PND, normal inspection of oropharnyx, TTP max sinus  NECK: no obvious masses on inspection  LUNGS: clear to auscultation bilaterally, no wheezes, rales or rhonchi, good air movement  CV: HRRR, no peripheral edema  MS: moves all extremities without noticeable abnormality  PSYCH:  pleasant and cooperative, no obvious depression or anxiety  ASSESSMENT AND PLAN:  Discussed the following assessment and plan:  Sinusitis - Plan: amoxicillin (AMOXIL) 875 MG tablet  Asthma, persistent not controlled - Plan: fluticasone (FLOVENT HFA) 44 MCG/ACT inhaler, albuterol (PROVENTIL HFA;VENTOLIN HFA) 108 (90 BASE) MCG/ACT inhaler  Conjunctivitis - Plan: sulfacetamide (BLEPH-10) 10 % ophthalmic solution   -We reviewed the PMH, PSH, FH, SH, Meds and Allergies. -We provided refills for any medications we will prescribe as needed. -We addressed current concerns per orders and patient instructions. -We have asked for records for pertinent exams, studies,  vaccines and notes from previous providers. -We have advised patient to follow up per instructions below.  -we discussed possible serious and likely etiologies, workup and treatment, treatment risks and return precautions for her acute complaints -after this discussion, Kristen Ball opted for tx of sinusitis and conjunctivitis and step up therapy for her chronic uncontrolled asthmatic symptoms -follow up advised in one month and as needed -of course, we advised Kristen Ball  to return or notify a doctor immediately if symptoms worsen or persist or new concerns arise.   -Patient advised to return or notify a doctor immediately if symptoms worsen or persist or new concerns arise.  Patient Instructions  For the sinus infection take the amoxicillin as instructed.  For the eye infection do compresses several times per day, flush eyes with artificial tears, start eye drops if not better in 2 days. Get new eye make up to use after infection resolved. See eye doctor if trouble with vision or worsening.  For your asthma take the flovent every day. Use albuterol as needed per instructions. If struggling to breath despite albuterol see a doctor immediately.  Follow up in 1 month or sooner as needed       Kristen Ball R.

## 2014-01-02 NOTE — Patient Instructions (Signed)
For the sinus infection take the amoxicillin as instructed.  For the eye infection do compresses several times per day, flush eyes with artificial tears, start eye drops if not better in 2 days. Get new eye make up to use after infection resolved. See eye doctor if trouble with vision or worsening.  For your asthma take the flovent every day. Use albuterol as needed per instructions. If struggling to breath despite albuterol see a doctor immediately.  Follow up in 1 month or sooner as needed

## 2014-01-03 ENCOUNTER — Telehealth: Payer: Self-pay | Admitting: Family Medicine

## 2014-01-03 NOTE — Telephone Encounter (Signed)
Relevant patient education mailed to patient.  

## 2014-01-30 ENCOUNTER — Ambulatory Visit (INDEPENDENT_AMBULATORY_CARE_PROVIDER_SITE_OTHER): Payer: BC Managed Care – PPO | Admitting: Family Medicine

## 2014-01-30 ENCOUNTER — Encounter: Payer: Self-pay | Admitting: Family Medicine

## 2014-01-30 ENCOUNTER — Encounter: Payer: BC Managed Care – PPO | Admitting: Family Medicine

## 2014-01-30 VITALS — BP 96/70 | Temp 99.0°F | Wt 111.0 lb

## 2014-01-30 DIAGNOSIS — J45909 Unspecified asthma, uncomplicated: Secondary | ICD-10-CM

## 2014-01-30 DIAGNOSIS — J329 Chronic sinusitis, unspecified: Secondary | ICD-10-CM

## 2014-01-30 NOTE — Progress Notes (Signed)
Pre visit review using our clinic review tool, if applicable. No additional management support is needed unless otherwise documented below in the visit note. 

## 2014-01-30 NOTE — Patient Instructions (Signed)
-  flovent twice dailly  -albuterol as needed  -follow up in 3 months

## 2014-01-30 NOTE — Progress Notes (Signed)
Error   This encounter was created in error - please disregard. 

## 2014-01-30 NOTE — Progress Notes (Signed)
Chief Complaint  Patient presents with  . asthma follow up    HPI:  Follow up:  Asthma and allergies: -added ICS last visit -reports: only using the flovent once daily with sig improvement uses alb 1-2 times per week -denies: SOB, wheezing, hemoptysis  Flu and sinusitis: -finally better, but out of work for several dates related to this and needs letter ROS: See pertinent positives and negatives per HPI.  Past Medical History  Diagnosis Date  . Asthma   . Migraines   . Frequent headaches     Past Surgical History  Procedure Laterality Date  . Dilation and curettage of uterus  2011    Family History  Problem Relation Age of Onset  . Arthritis Mother   . Breast cancer Maternal Aunt   . Diabetes Paternal Grandmother   . Hypertension Paternal Grandfather     History   Social History  . Marital Status: Unknown    Spouse Name: N/A    Number of Children: N/A  . Years of Education: N/A   Social History Main Topics  . Smoking status: Current Some Day Smoker  . Smokeless tobacco: None     Comment: smokes once every 3 weeks  . Alcohol Use: Yes     Comment: occ  . Drug Use: No  . Sexual Activity: None   Other Topics Concern  . None   Social History Narrative   Work or School: works at post [employer] Situation: lives alone       Spiritual Beliefs: none      Lifestyle: no regular exercising; diet is poor             Current outpatient prescriptions:albuterol (PROVENTIL HFA;VENTOLIN HFA) 108 (90 BASE) MCG/ACT inhaler, Inhale 2 puffs into the lungs every 6 (six) hours as needed for wheezing or shortness of breath., Disp: 1 Inhaler, Rfl: 0;  fluticasone (FLOVENT HFA) 44 MCG/ACT inhaler, Inhale 2 puffs into the lungs 2 (two) times daily., Disp: 1 Inhaler, Rfl: 3  EXAM:  Filed Vitals:   01/30/14 1352  BP: 96/70  Temp: 99 F (37.2 C)    Body mass index is 19.21 kg/(m^2).  GENERAL: vitals reviewed and listed above, alert, oriented, appears well  hydrated and in no acute distress  HEENT: atraumatic, conjunttiva clear, no obvious abnormalities on inspection of external nose and ears  NECK: no obvious masses on inspection  LUNGS: clear to auscultation bilaterally, no wheezes, rales or rhonchi, good air movement  CV: HRRR, no peripheral edema  MS: moves all extremities without noticeable abnormality  PSYCH: pleasant and cooperative, no obvious depression or anxiety  ASSESSMENT AND PLAN:  Discussed the following assessment and plan:  Asthma  Sinusitis   -letter provided for work -Patient advised to return or notify a doctor immediately if symptoms worsen or persist or new concerns arise.  Patient Instructions  -flovent twice dailly  -albuterol as needed  -follow up in 3 months     KIM, HANNAH R.

## 2014-03-14 ENCOUNTER — Encounter (HOSPITAL_COMMUNITY): Payer: Self-pay | Admitting: Emergency Medicine

## 2014-03-14 ENCOUNTER — Emergency Department (HOSPITAL_COMMUNITY): Payer: BC Managed Care – PPO

## 2014-03-14 ENCOUNTER — Emergency Department (HOSPITAL_COMMUNITY)
Admission: EM | Admit: 2014-03-14 | Discharge: 2014-03-14 | Disposition: A | Discharge status: Home / Self Care | Payer: BC Managed Care – PPO | Attending: Emergency Medicine | Admitting: Emergency Medicine

## 2014-03-14 DIAGNOSIS — J45909 Unspecified asthma, uncomplicated: Secondary | ICD-10-CM | POA: Insufficient documentation

## 2014-03-14 DIAGNOSIS — M19041 Primary osteoarthritis, right hand: Secondary | ICD-10-CM

## 2014-03-14 DIAGNOSIS — Z79899 Other long term (current) drug therapy: Secondary | ICD-10-CM | POA: Insufficient documentation

## 2014-03-14 DIAGNOSIS — M19049 Primary osteoarthritis, unspecified hand: Secondary | ICD-10-CM | POA: Insufficient documentation

## 2014-03-14 DIAGNOSIS — Y9389 Activity, other specified: Secondary | ICD-10-CM | POA: Insufficient documentation

## 2014-03-14 DIAGNOSIS — Y929 Unspecified place or not applicable: Secondary | ICD-10-CM | POA: Insufficient documentation

## 2014-03-14 DIAGNOSIS — W2209XA Striking against other stationary object, initial encounter: Secondary | ICD-10-CM | POA: Insufficient documentation

## 2014-03-14 DIAGNOSIS — IMO0002 Reserved for concepts with insufficient information to code with codable children: Secondary | ICD-10-CM | POA: Insufficient documentation

## 2014-03-14 DIAGNOSIS — Z8679 Personal history of other diseases of the circulatory system: Secondary | ICD-10-CM | POA: Insufficient documentation

## 2014-03-14 DIAGNOSIS — F17200 Nicotine dependence, unspecified, uncomplicated: Secondary | ICD-10-CM | POA: Insufficient documentation

## 2014-03-14 MED ORDER — IBUPROFEN 800 MG PO TABS: 800.0000 mg | ORAL_TABLET | Freq: Three times a day (TID) | ORAL | Status: DC

## 2014-03-14 MED ORDER — IBUPROFEN 800 MG PO TABS
800.0000 mg | ORAL_TABLET | Freq: Once | ORAL | Status: AC
  Administered 2014-03-14: 800 mg via ORAL
  Filled 2014-03-14: qty 1

## 2014-03-14 NOTE — Discharge Instructions (Signed)
Your x-ray does not show any concerning findings to explain your hand pain. At this time your providers for your symptoms may be caused by arthritis in the joints of your hand. Please use ibuprofen and followup with your primary care provider.    Arthritis, Nonspecific Arthritis is inflammation of a joint. This usually means pain, redness, warmth or swelling are present. One or more joints may be involved. There are a number of types of arthritis. Your caregiver may not be able to tell what type of arthritis you have right away. CAUSES  The most common cause of arthritis is the wear and tear on the joint (osteoarthritis). This causes damage to the cartilage, which can break down over time. The knees, hips, back and neck are most often affected by this type of arthritis. Other types of arthritis and common causes of joint pain include:  Sprains and other injuries near the joint. Sometimes minor sprains and injuries cause pain and swelling that develop hours later.  Rheumatoid arthritis. This affects hands, feet and knees. It usually affects both sides of your body at the same time. It is often associated with chronic ailments, fever, weight loss and general weakness.  Crystal arthritis. Gout and pseudo gout can cause occasional acute severe pain, redness and swelling in the foot, ankle, or knee.  Infectious arthritis. Bacteria can get into a joint through a break in overlying skin. This can cause infection of the joint. Bacteria and viruses can also spread through the blood and affect your joints.  Drug, infectious and allergy reactions. Sometimes joints can become mildly painful and slightly swollen with these types of illnesses. SYMPTOMS   Pain is the main symptom.  Your joint or joints can also be red, swollen and warm or hot to the touch.  You may have a fever with certain types of arthritis, or even feel overall ill.  The joint with arthritis will hurt with movement. Stiffness is  present with some types of arthritis. DIAGNOSIS  Your caregiver will suspect arthritis based on your description of your symptoms and on your exam. Testing may be needed to find the type of arthritis:  Blood and sometimes urine tests.  X-ray tests and sometimes CT or MRI scans.  Removal of fluid from the joint (arthrocentesis) is done to check for bacteria, crystals or other causes. Your caregiver (or a specialist) will numb the area over the joint with a local anesthetic, and use a needle to remove joint fluid for examination. This procedure is only minimally uncomfortable.  Even with these tests, your caregiver may not be able to tell what kind of arthritis you have. Consultation with a specialist (rheumatologist) may be helpful. TREATMENT  Your caregiver will discuss with you treatment specific to your type of arthritis. If the specific type cannot be determined, then the following general recommendations may apply. Treatment of severe joint pain includes:  Rest.  Elevation.  Anti-inflammatory medication (for example, ibuprofen) may be prescribed. Avoiding activities that cause increased pain.  Only take over-the-counter or prescription medicines for pain and discomfort as recommended by your caregiver.  Cold packs over an inflamed joint may be used for 10 to 15 minutes every hour. Hot packs sometimes feel better, but do not use overnight. Do not use hot packs if you are diabetic without your caregiver's permission.  A cortisone shot into arthritic joints may help reduce pain and swelling.  Any acute arthritis that gets worse over the next 1 to 2 days needs to be  looked at to be sure there is no joint infection. Long-term arthritis treatment involves modifying activities and lifestyle to reduce joint stress jarring. This can include weight loss. Also, exercise is needed to nourish the joint cartilage and remove waste. This helps keep the muscles around the joint strong. HOME CARE  INSTRUCTIONS   Do not take aspirin to relieve pain if gout is suspected. This elevates uric acid levels.  Only take over-the-counter or prescription medicines for pain, discomfort or fever as directed by your caregiver.  Rest the joint as much as possible.  If your joint is swollen, keep it elevated.  Use crutches if the painful joint is in your leg.  Drinking plenty of fluids may help for certain types of arthritis.  Follow your caregiver's dietary instructions.  Try low-impact exercise such as:  Swimming.  Water aerobics.  Biking.  Walking.  Morning stiffness is often relieved by a warm shower.  Put your joints through regular range-of-motion. SEEK MEDICAL CARE IF:   You do not feel better in 24 hours or are getting worse.  You have side effects to medications, or are not getting better with treatment. SEEK IMMEDIATE MEDICAL CARE IF:   You have a fever.  You develop severe joint pain, swelling or redness.  Many joints are involved and become painful and swollen.  There is severe back pain and/or leg weakness.  You have loss of bowel or bladder control. Document Released: 12/03/2004 Document Revised: 01/18/2012 Document Reviewed: 12/19/2008 [facility] Patient Information 2014 [place].

## 2014-03-14 NOTE — ED Notes (Signed)
Pt. punched a wall on 2011 reports recurring pain at her left ring finger knuckle these past several days , denies recent injury.

## 2014-03-14 NOTE — ED Provider Notes (Signed)
CSN: [id number]     Arrival date & time 03/14/14  0147 History   First MD Initiated Contact with Patient 03/14/14 [phone]     Chief Complaint  Patient presents with  . Hand Injury   HPI  History provided by the patient. Patient is a 23 year old female who presents with complaints of worsened right hand pain. Patient reports she's been having worsened pain in and around her right ring finger and knuckles for the last week or more. She states she really has had some intermittent pains to the hand for the past several years ever since she punched a wall. She states she was never evaluated after punching a wall and is unsure if she may have broken any bones. She denies any new injury or trauma to the hand. She is right-handed and uses her hand often. She has not used any medications to help with symptoms. Pain is worse with any range of motion or movements. There are no other associated symptoms.    Past Medical History  Diagnosis Date  . Asthma   . Migraines   . Frequent headaches    Past Surgical History  Procedure Laterality Date  . Dilation and curettage of uterus  2011   Family History  Problem Relation Age of Onset  . Arthritis Mother   . Breast cancer Maternal Aunt   . Diabetes Paternal Grandmother   . Hypertension Paternal Grandfather    History  Substance Use Topics  . Smoking status: Current Some Day Smoker  . Smokeless tobacco: Not on file     Comment: smokes once every 3 weeks  . Alcohol Use: Yes     Comment: occ   OB History   Grav Para Term Preterm Abortions TAB SAB Ect Mult Living                 Review of Systems  Constitutional: Negative for fever.  Neurological: Negative for weakness and numbness.  All other systems reviewed and are negative.     Allergies  Review of patient's allergies indicates no known allergies.  Home Medications   Prior to Admission medications   Medication Sig Start Date End Date Taking? Authorizing Provider  albuterol  (PROVENTIL HFA;VENTOLIN HFA) 108 (90 BASE) MCG/ACT inhaler Inhale 2 puffs into the lungs every 6 (six) hours as needed for wheezing or shortness of breath. 01/02/14  Yes Lucretia Kern, DO  fluticasone (FLOVENT HFA) 44 MCG/ACT inhaler Inhale 2 puffs into the lungs 2 (two) times daily. 01/02/14  Yes Lucretia Kern, DO   BP 106/76  Pulse 74  Temp(Src) 99.6 F (37.6 C) (Oral)  Resp 14  Ht 5\' 3"  (1.6 m)  Wt 108 lb (48.988 kg)  BMI 19.14 kg/m2  SpO2 100% Physical Exam  Nursing note and vitals reviewed. Constitutional: She is oriented to person, place, and time. She appears well-developed and well-nourished. No distress.  HENT:  Head: Normocephalic.  Cardiovascular: Normal rate and regular rhythm.   Pulmonary/Chest: Effort normal and breath sounds normal. No respiratory distress.  Musculoskeletal: Normal range of motion. She exhibits no edema.  Mild tenderness at right fourth MCP joint. There is no swelling or gross deformity. Full range of motion of the joint. Normal strength of the digits in all directions. Normal distal sensations and capillary refill.  Neurological: She is alert and oriented to person, place, and time.  Skin: Skin is warm and dry. No rash noted.  Psychiatric: She has a normal mood and affect. Her behavior is  normal.    ED Course  Procedures   COORDINATION OF CARE:  Nursing notes reviewed. Vital signs reviewed. Initial pt interview and examination performed.   Filed Vitals:   03/14/14 0154  BP: 106/76  Pulse: 74  Temp: 99.6 F (37.6 C)  TempSrc: Oral  Resp: 14  Height: 5\' 3"  (1.6 m)  Weight: 108 lb (48.988 kg)  SpO2: 100%    4:59 AM-patient seen and evaluated. Patient appears well in no acute distress. No signs of swelling or deformity of the hand. Normal range of motion. Normal sensations.  X-ray reviewed. No findings of concerning old injuries. No acute injuries. Past This time suspect possible arthritis within the hand. We'll recommend conservative  treatment and followup with PCP.   Imaging Review Dg Hand Complete Right  03/14/2014   CLINICAL DATA:  Remote hand injury with pain  EXAM: RIGHT HAND - COMPLETE 3+ VIEW  COMPARISON:  None.  FINDINGS: There is no evidence of fracture or dislocation. There is no evidence of arthropathy or other focal bone abnormality. Soft tissues are unremarkable.  IMPRESSION: Negative.   Electronically Signed   By: Jorje Guild M.D.   On: 03/14/2014 05:16     MDM   Final diagnoses:  Arthritis of hand, right        Martie Lee, PA-C 03/14/14 [phone]

## 2014-03-14 NOTE — ED Provider Notes (Signed)
Medical screening examination/treatment/procedure(s) were performed by non-physician practitioner and as supervising physician I was immediately available for consultation/collaboration.   Teressa Lower, MD 03/14/14 [phone]

## 2014-05-07 ENCOUNTER — Emergency Department (HOSPITAL_COMMUNITY)
Admission: EM | Admit: 2014-05-07 | Discharge: 2014-05-07 | Disposition: A | Discharge status: Home / Self Care | Payer: BC Managed Care – PPO | Attending: Emergency Medicine | Admitting: Emergency Medicine

## 2014-05-07 ENCOUNTER — Emergency Department (HOSPITAL_COMMUNITY): Payer: BC Managed Care – PPO

## 2014-05-07 DIAGNOSIS — Z3202 Encounter for pregnancy test, result negative: Secondary | ICD-10-CM | POA: Insufficient documentation

## 2014-05-07 DIAGNOSIS — J45909 Unspecified asthma, uncomplicated: Secondary | ICD-10-CM | POA: Insufficient documentation

## 2014-05-07 DIAGNOSIS — S4980XA Other specified injuries of shoulder and upper arm, unspecified arm, initial encounter: Secondary | ICD-10-CM | POA: Insufficient documentation

## 2014-05-07 DIAGNOSIS — S46909A Unspecified injury of unspecified muscle, fascia and tendon at shoulder and upper arm level, unspecified arm, initial encounter: Secondary | ICD-10-CM | POA: Insufficient documentation

## 2014-05-07 DIAGNOSIS — S79929A Unspecified injury of unspecified thigh, initial encounter: Secondary | ICD-10-CM

## 2014-05-07 DIAGNOSIS — F17200 Nicotine dependence, unspecified, uncomplicated: Secondary | ICD-10-CM | POA: Insufficient documentation

## 2014-05-07 DIAGNOSIS — M25512 Pain in left shoulder: Secondary | ICD-10-CM

## 2014-05-07 DIAGNOSIS — Y92410 Unspecified street and highway as the place of occurrence of the external cause: Secondary | ICD-10-CM | POA: Insufficient documentation

## 2014-05-07 DIAGNOSIS — S79919A Unspecified injury of unspecified hip, initial encounter: Secondary | ICD-10-CM | POA: Insufficient documentation

## 2014-05-07 DIAGNOSIS — M791 Myalgia, unspecified site: Secondary | ICD-10-CM

## 2014-05-07 DIAGNOSIS — S161XXA Strain of muscle, fascia and tendon at neck level, initial encounter: Secondary | ICD-10-CM

## 2014-05-07 DIAGNOSIS — Z8679 Personal history of other diseases of the circulatory system: Secondary | ICD-10-CM | POA: Insufficient documentation

## 2014-05-07 DIAGNOSIS — Y9389 Activity, other specified: Secondary | ICD-10-CM | POA: Insufficient documentation

## 2014-05-07 DIAGNOSIS — S139XXA Sprain of joints and ligaments of unspecified parts of neck, initial encounter: Secondary | ICD-10-CM | POA: Insufficient documentation

## 2014-05-07 DIAGNOSIS — Z79899 Other long term (current) drug therapy: Secondary | ICD-10-CM | POA: Insufficient documentation

## 2014-05-07 LAB — POC URINE PREG, ED: PREG TEST UR: NEGATIVE

## 2014-05-07 MED ORDER — CYCLOBENZAPRINE HCL 10 MG PO TABS: 10.0000 mg | ORAL_TABLET | Freq: Two times a day (BID) | ORAL | Status: DC | PRN

## 2014-05-07 MED ORDER — IBUPROFEN 600 MG PO TABS: 600.0000 mg | ORAL_TABLET | Freq: Four times a day (QID) | ORAL | Status: DC | PRN

## 2014-05-07 MED ORDER — ACETAMINOPHEN 325 MG PO TABS
325.0000 mg | ORAL_TABLET | Freq: Once | ORAL | Status: AC
  Administered 2014-05-07: 325 mg via ORAL
  Filled 2014-05-07: qty 1

## 2014-05-07 NOTE — ED Notes (Signed)
Pt ambulated with supervision. No c/o increased pain or dizziness

## 2014-05-07 NOTE — ED Provider Notes (Signed)
Medical screening examination/treatment/procedure(s) were performed by non-physician practitioner and as supervising physician I was immediately available for consultation/collaboration.   EKG Interpretation None        Babette Relic, MD 05/09/14 2210
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
## 2014-05-07 NOTE — Discharge Instructions (Signed)
Please call your doctor for a followup appointment within 24-48 hours. When you talk to your doctor please let them know that you were seen in the emergency department and have them acquire all of your records so that they can discuss the findings with you and formulate a treatment plan to fully care for your new and ongoing problems. Please call and set-up an appointment with your primary care provider  Please call and set-up an appointment with Orthopedics to be re-assessed Please rest and stay hydrated Please avoid any physical or strenuous activity Please take medications as prescribed - muscle relaxers may cause drowsiness, please no driving or operating any heavy machinery while on the medication  Please continue to monitor symptoms closely and if symptoms are to worsen or change (fever greater than 101, chills, sweating,nausea vomiting, chest pain, shortness of breath, difficulty breathing, numbness, tingling, weakness, headache, neck pain, neck stiffness, abdominal pain, loss of sensation, fall, injury, dizziness, blurred vision, sudden loss of vision) please report back to the ED immediately   Cervical Strain and Sprain (Whiplash) with Rehab Cervical strain and sprains are injuries that commonly occur with "whiplash" injuries. Whiplash occurs when the neck is forcefully whipped backward or forward, such as during a motor vehicle accident. The muscles, ligaments, tendons, discs and nerves of the neck are susceptible to injury when this occurs. SYMPTOMS   Pain or stiffness in the front and/or back of neck  Symptoms may present immediately or up to 24 hours after injury.  Dizziness, headache, nausea and vomiting.  Muscle spasm with soreness and stiffness in the neck.  Tenderness and swelling at the injury site. CAUSES  Whiplash injuries often occur during contact sports or motor vehicle accidents.  RISK INCREASES WITH:  Osteoarthritis of the spine.  Situations that make head or neck  accidents or trauma more likely.  High-risk sports (football, rugby, wrestling, hockey, auto racing, gymnastics, diving, contact karate or boxing).  Poor strength and flexibility of the neck.  Previous neck injury.  Poor tackling technique.  Improperly fitted or padded equipment. PREVENTION  Learn and use proper technique (avoid tackling with the head, spearing and head-butting; use proper falling techniques to avoid landing on the head).  Warm up and stretch properly before activity.  Maintain physical fitness:  Strength, flexibility and endurance.  Cardiovascular fitness.  Wear properly fitted and padded protective equipment, such as padded soft collars, for participation in contact sports. PROGNOSIS  Recovery for cervical strain and sprain injuries is dependent on the extent of the injury. These injuries are usually curable in 1 week to 3 months with appropriate treatment.  RELATED COMPLICATIONS   Temporary numbness and weakness may occur if the nerve roots are damaged, and this may persist until the nerve has completely healed.  Chronic pain due to frequent recurrence of symptoms.  Prolonged healing, especially if activity is resumed too soon (before complete recovery). TREATMENT  Treatment initially involves the use of ice and medication to help reduce pain and inflammation. It is also important to perform strengthening and stretching exercises and modify activities that worsen symptoms so the injury does not get worse. These exercises may be performed at home or with a therapist. For patients who experience severe symptoms, a soft padded collar may be recommended to be worn around the neck.  Improving your posture may help reduce symptoms. Posture improvement includes pulling your chin and abdomen in while sitting or standing. If you are sitting, sit in a firm chair with your buttocks against  the back of the chair. While sleeping, try replacing your pillow with a small towel  rolled to 2 inches in diameter, or use a cervical pillow or soft cervical collar. Poor sleeping positions delay healing.  For patients with nerve root damage, which causes numbness or weakness, the use of a cervical traction apparatus may be recommended. Surgery is rarely necessary for these injuries. However, cervical strain and sprains that are present at birth (congenital) may require surgery. MEDICATION   If pain medication is necessary, nonsteroidal anti-inflammatory medications, such as aspirin and ibuprofen, or other minor pain relievers, such as acetaminophen, are often recommended.  Do not take pain medication for 7 days before surgery.  Prescription pain relievers may be given if deemed necessary by your caregiver. Use only as directed and only as much as you need. HEAT AND COLD:   Cold treatment (icing) relieves pain and reduces inflammation. Cold treatment should be applied for 10 to 15 minutes every 2 to 3 hours for inflammation and pain and immediately after any activity that aggravates your symptoms. Use ice packs or an ice massage.  Heat treatment may be used prior to performing the stretching and strengthening activities prescribed by your caregiver, physical therapist, or athletic trainer. Use a heat pack or a warm soak. SEEK MEDICAL CARE IF:   Symptoms get worse or do not improve in 2 weeks despite treatment.  New, unexplained symptoms develop (drugs used in treatment may produce side effects). EXERCISES RANGE OF MOTION (ROM) AND STRETCHING EXERCISES - Cervical Strain and Sprain These exercises may help you when beginning to rehabilitate your injury. In order to successfully resolve your symptoms, you must improve your posture. These exercises are designed to help reduce the forward-head and rounded-shoulder posture which contributes to this condition. Your symptoms may resolve with or without further involvement from your physician, physical therapist or athletic trainer.  While completing these exercises, remember:   Restoring tissue flexibility helps normal motion to return to the joints. This allows healthier, less painful movement and activity.  An effective stretch should be held for at least 20 seconds, although you may need to begin with shorter hold times for comfort.  A stretch should never be painful. You should only feel a gentle lengthening or release in the stretched tissue. STRETCH- Axial Extensors  Lie on your back on the floor. You may bend your knees for comfort. Place a rolled up hand towel or dish towel, about 2 inches in diameter, under the part of your head that makes contact with the floor.  Gently tuck your chin, as if trying to make a "double chin," until you feel a gentle stretch at the base of your head.  Hold __________ seconds. Repeat __________ times. Complete this exercise __________ times per day.  STRETECH - Axial Extension   Stand or sit on a firm surface. Assume a good posture: chest up, shoulders drawn back, abdominal muscles slightly tense, knees unlocked (if standing) and feet hip width apart.  Slowly retract your chin so your head slides back and your chin slightly lowers.Continue to look straight ahead.  You should feel a gentle stretch in the back of your head. Be certain not to feel an aggressive stretch since this can cause headaches later.  Hold for __________ seconds. Repeat __________ times. Complete this exercise __________ times per day. STRETCH - Cervical Side Bend   Stand or sit on a firm surface. Assume a good posture: chest up, shoulders drawn back, abdominal muscles slightly tense, knees  unlocked (if standing) and feet hip width apart.  Without letting your nose or shoulders move, slowly tip your right / left ear to your shoulder until your feel a gentle stretch in the muscles on the opposite side of your neck.  Hold __________ seconds. Repeat __________ times. Complete this exercise __________ times  per day. STRETCH - Cervical Rotators   Stand or sit on a firm surface. Assume a good posture: chest up, shoulders drawn back, abdominal muscles slightly tense, knees unlocked (if standing) and feet hip width apart.  Keeping your eyes level with the ground, slowly turn your head until you feel a gentle stretch along the back and opposite side of your neck.  Hold __________ seconds. Repeat __________ times. Complete this exercise __________ times per day. RANGE OF MOTION - Neck Circles   Stand or sit on a firm surface. Assume a good posture: chest up, shoulders drawn back, abdominal muscles slightly tense, knees unlocked (if standing) and feet hip width apart.  Gently roll your head down and around from the back of one shoulder to the back of the other. The motion should never be forced or painful.  Repeat the motion 10-20 times, or until you feel the neck muscles relax and loosen. Repeat __________ times. Complete the exercise __________ times per day. STRENGTHENING EXERCISES - Cervical Strain and Sprain These exercises may help you when beginning to rehabilitate your injury. They may resolve your symptoms with or without further involvement from your physician, physical therapist or athletic trainer. While completing these exercises, remember:   Muscles can gain both the endurance and the strength needed for everyday activities through controlled exercises.  Complete these exercises as instructed by your physician, physical therapist or athletic trainer. Progress the resistance and repetitions only as guided.  You may experience muscle soreness or fatigue, but the pain or discomfort you are trying to eliminate should never worsen during these exercises. If this pain does worsen, stop and make certain you are following the directions exactly. If the pain is still present after adjustments, discontinue the exercise until you can discuss the trouble with your clinician. STRENGTH - Cervical  Flexors, Isometric  Face a wall, standing about 6 inches away. Place a small pillow, a ball about 6-8 inches in diameter, or a folded towel between your forehead and the wall.  Slightly tuck your chin and gently push your forehead into the soft object. Push only with mild to moderate intensity, building up tension gradually. Keep your jaw and forehead relaxed.  Hold 10 to 20 seconds. Keep your breathing relaxed.  Release the tension slowly. Relax your neck muscles completely before you start the next repetition. Repeat __________ times. Complete this exercise __________ times per day. STRENGTH- Cervical Lateral Flexors, Isometric   Stand about 6 inches away from a wall. Place a small pillow, a ball about 6-8 inches in diameter, or a folded towel between the side of your head and the wall.  Slightly tuck your chin and gently tilt your head into the soft object. Push only with mild to moderate intensity, building up tension gradually. Keep your jaw and forehead relaxed.  Hold 10 to 20 seconds. Keep your breathing relaxed.  Release the tension slowly. Relax your neck muscles completely before you start the next repetition. Repeat __________ times. Complete this exercise __________ times per day. STRENGTH - Cervical Extensors, Isometric   Stand about 6 inches away from a wall. Place a small pillow, a ball about 6-8 inches in diameter, or  a folded towel between the back of your head and the wall.  Slightly tuck your chin and gently tilt your head back into the soft object. Push only with mild to moderate intensity, building up tension gradually. Keep your jaw and forehead relaxed.  Hold 10 to 20 seconds. Keep your breathing relaxed.  Release the tension slowly. Relax your neck muscles completely before you start the next repetition. Repeat __________ times. Complete this exercise __________ times per day. POSTURE AND BODY MECHANICS CONSIDERATIONS - Cervical Strain and Sprain Keeping correct  posture when sitting, standing or completing your activities will reduce the stress put on different body tissues, allowing injured tissues a chance to heal and limiting painful experiences. The following are general guidelines for improved posture. Your physician or physical therapist will provide you with any instructions specific to your needs. While reading these guidelines, remember:  The exercises prescribed by your provider will help you have the flexibility and strength to maintain correct postures.  The correct posture provides the optimal environment for your joints to work. All of your joints have less wear and tear when properly supported by a spine with good posture. This means you will experience a healthier, less painful body.  Correct posture must be practiced with all of your activities, especially prolonged sitting and standing. Correct posture is as important when doing repetitive low-stress activities (typing) as it is when doing a single heavy-load activity (lifting). PROLONGED STANDING WHILE SLIGHTLY LEANING FORWARD When completing a task that requires you to lean forward while standing in one place for a long time, place either foot up on a stationary 2-4 inch high object to help maintain the best posture. When both feet are on the ground, the low back tends to lose its slight inward curve. If this curve flattens (or becomes too large), then the back and your other joints will experience too much stress, fatigue more quickly and can cause pain.  RESTING POSITIONS Consider which positions are most painful for you when choosing a resting position. If you have pain with flexion-based activities (sitting, bending, stooping, squatting), choose a position that allows you to rest in a less flexed posture. You would want to avoid curling into a fetal position on your side. If your pain worsens with extension-based activities (prolonged standing, working overhead), avoid resting in an  extended position such as sleeping on your stomach. Most people will find more comfort when they rest with their spine in a more neutral position, neither too rounded nor too arched. Lying on a non-sagging bed on your side with a pillow between your knees, or on your back with a pillow under your knees will often provide some relief. Keep in mind, being in any one position for a prolonged period of time, no matter how correct your posture, can still lead to stiffness. WALKING Walk with an upright posture. Your ears, shoulders and hips should all line-up. OFFICE WORK When working at a desk, create an environment that supports good, upright posture. Without extra support, muscles fatigue and lead to excessive strain on joints and other tissues. CHAIR:  A chair should be able to slide under your desk when your back makes contact with the back of the chair. This allows you to work closely.  The chair's height should allow your eyes to be level with the upper part of your monitor and your hands to be slightly lower than your elbows.  Body position:  Your feet should make contact with the floor. If  this is not possible, use a foot rest.  Keep your ears over your shoulders. This will reduce stress on your neck and low back. Document Released: 10/26/2005 Document Revised: 02/20/2013 Document Reviewed: 02/07/2009 [facility] Patient Information 2015 [place], [place]. This information is not intended to replace advice given to you by your health care provider. Make sure you discuss any questions you have with your health care provider.  Muscle Pain Muscle pain (myalgia) may be caused by many things, including:  Overuse or muscle strain, especially if you are not in shape. This is the most common cause of muscle pain.  Injury.  Bruises.  Viruses, such as the flu.  Infectious diseases.  Fibromyalgia, which is a chronic condition that causes muscle tenderness, fatigue, and headache.  Autoimmune  diseases, including lupus.  Certain drugs, including ACE inhibitors and statins. Muscle pain may be mild or severe. In most cases, the pain lasts only a short time and goes away without treatment. To diagnose the cause of your muscle pain, your health care provider will take your medical history. This means he or she will ask you when your muscle pain began and what has been happening. If you have not had muscle pain for very long, your health care provider may want to wait before doing much testing. If your muscle pain has lasted a long time, your health care provider may want to run tests right away. If your health care provider thinks your muscle pain may be caused by illness, you may need to have additional tests to rule out certain conditions.  Treatment for muscle pain depends on the cause. Home care is often enough to relieve muscle pain. Your health care provider may also prescribe anti-inflammatory medicine. HOME CARE INSTRUCTIONS Watch your condition for any changes. The following actions may help to lessen any discomfort you are feeling:  Only take over-the-counter or prescription medicines as directed by your health care provider.  Apply ice to the sore muscle:  Put ice in a plastic bag.  Place a towel between your skin and the bag.  Leave the ice on for 15-20 minutes, 3-4 times a day.  You may alternate applying hot and cold packs to the muscle as directed by your health care provider.  If overuse is causing your muscle pain, slow down your activities until the pain goes away.  Remember that it is normal to feel some muscle pain after starting a workout program. Muscles that have not been used often will be sore at first.  Do regular, gentle exercises if you are not usually active.  Warm up before exercising to lower your risk of muscle pain.  Do not continue working out if the pain is very bad. Bad pain could mean you have injured a muscle. SEEK MEDICAL CARE IF:  Your  muscle pain gets worse, and medicines do not help.  You have muscle pain that lasts longer than 3 days.  You have a rash or fever along with muscle pain.  You have muscle pain after a tick bite.  You have muscle pain while working out, even though you are in good physical condition.  You have redness, soreness, or swelling along with muscle pain.  You have muscle pain after starting a new medicine or changing the dose of a medicine. SEEK IMMEDIATE MEDICAL CARE IF:  You have trouble breathing.  You have trouble swallowing.  You have muscle pain along with a stiff neck, fever, and vomiting.  You have severe muscle weakness  or cannot move part of your body. MAKE SURE YOU:   Understand these instructions.  Will watch your condition.  Will get help right away if you are not doing well or get worse. Document Released: 09/17/2006 Document Revised: 10/31/2013 Document Reviewed: 08/22/2013 [facility] Patient Information 2015 [place], [place]. This information is not intended to replace advice given to you by your health care provider. Make sure you discuss any questions you have with your health care provider.

## 2014-05-10 ENCOUNTER — Encounter: Payer: Self-pay | Admitting: *Deleted

## 2014-05-10 ENCOUNTER — Encounter: Payer: Self-pay | Admitting: Family Medicine

## 2014-05-10 ENCOUNTER — Ambulatory Visit (INDEPENDENT_AMBULATORY_CARE_PROVIDER_SITE_OTHER): Payer: BC Managed Care – PPO | Admitting: Family Medicine

## 2014-05-10 VITALS — BP 100/60 | HR 76 | Temp 98.7°F | Ht 63.0 in | Wt 106.0 lb

## 2014-05-10 DIAGNOSIS — M25519 Pain in unspecified shoulder: Secondary | ICD-10-CM

## 2014-05-10 DIAGNOSIS — M25512 Pain in left shoulder: Secondary | ICD-10-CM

## 2014-05-10 DIAGNOSIS — M542 Cervicalgia: Secondary | ICD-10-CM

## 2014-05-10 NOTE — Progress Notes (Signed)
Pre visit review using our clinic review tool, if applicable. No additional management support is needed unless otherwise documented below in the visit note. 
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
## 2014-05-10 NOTE — Patient Instructions (Addendum)
-  it seems that most of your pain is in the muscles and ligaments  -use naproxen or ibuprofen or tylenol per instructions as needed for pain - do not take more then recommended on package  -heat for 15 minutes daily  -often with this type of injury gentle activity and a quick return to normal activities and work is associated with a faster and better healing process  -we do advise no heavy lifting, pushing or pulling or strenuous activity until evaluated by the orthopedic doctor next week and then we advise that you follow their recommendtions  -follow up as needed

## 2014-11-09 DIAGNOSIS — N63 Unspecified lump in unspecified breast: Secondary | ICD-10-CM

## 2014-11-09 HISTORY — DX: Unspecified lump in unspecified breast: N63.0

## 2014-11-19 ENCOUNTER — Other Ambulatory Visit: Payer: Self-pay | Admitting: Obstetrics and Gynecology

## 2014-11-19 DIAGNOSIS — N6320 Unspecified lump in the left breast, unspecified quadrant: Secondary | ICD-10-CM

## 2014-11-22 ENCOUNTER — Other Ambulatory Visit: Payer: Self-pay

## 2014-11-23 ENCOUNTER — Ambulatory Visit
Admission: RE | Admit: 2014-11-23 | Discharge: 2014-11-23 | Disposition: A | Discharge status: Home / Self Care | Payer: BLUE CROSS/BLUE SHIELD | Source: Ambulatory Visit | Attending: Obstetrics and Gynecology | Admitting: Obstetrics and Gynecology

## 2014-11-23 DIAGNOSIS — N6320 Unspecified lump in the left breast, unspecified quadrant: Secondary | ICD-10-CM

## 2015-06-11 ENCOUNTER — Other Ambulatory Visit: Payer: Self-pay | Admitting: Obstetrics and Gynecology

## 2015-06-11 DIAGNOSIS — N63 Unspecified lump in unspecified breast: Secondary | ICD-10-CM

## 2015-06-14 ENCOUNTER — Other Ambulatory Visit: Payer: Self-pay | Admitting: Obstetrics and Gynecology

## 2015-06-14 ENCOUNTER — Ambulatory Visit
Admission: RE | Admit: 2015-06-14 | Discharge: 2015-06-14 | Disposition: A | Discharge status: Home / Self Care | Payer: BLUE CROSS/BLUE SHIELD | Source: Ambulatory Visit | Attending: Obstetrics and Gynecology | Admitting: Obstetrics and Gynecology

## 2015-06-14 DIAGNOSIS — N63 Unspecified lump in unspecified breast: Secondary | ICD-10-CM

## 2015-06-18 ENCOUNTER — Other Ambulatory Visit: Payer: Self-pay | Admitting: Obstetrics and Gynecology

## 2015-06-18 DIAGNOSIS — N63 Unspecified lump in unspecified breast: Secondary | ICD-10-CM

## 2015-06-21 ENCOUNTER — Ambulatory Visit
Admission: RE | Admit: 2015-06-21 | Discharge: 2015-06-21 | Disposition: A | Discharge status: Home / Self Care | Payer: BLUE CROSS/BLUE SHIELD | Source: Ambulatory Visit | Attending: Obstetrics and Gynecology | Admitting: Obstetrics and Gynecology

## 2015-06-21 DIAGNOSIS — N63 Unspecified lump in unspecified breast: Secondary | ICD-10-CM

## 2015-07-08 ENCOUNTER — Ambulatory Visit: Payer: Self-pay | Admitting: Surgery

## 2015-07-08 NOTE — H&P (Signed)
History of Present Illness (Matthew K. Tsuei MD; 07/08/2015 10:55 AM) The patient is a 23 year old female who presents with a breast mass. 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 This has been evaluated twice with ultrasound and most recently she underwent a core biopsy which confirmed that this was a fibroadenoma. She is concerned about the enlargement and presents now for surgical evaluation for excision. She has a family history of breast cancer in a maternal aunt.    CLINICAL DATA: The patient returns for six-month follow-up of a palpable left breast mass 530 location. She states the mass has increased in size.  EXAM: ULTRASOUND OF THE left BREAST  COMPARISON: Previous exam(s).  FINDINGS: Targeted ultrasound is performed, showing an oval hypoechoic left breast mass 530 location 8 cm from the nipple measuring 1.2 x 0.7 x 1.0 cm, previously 1.1 x 0.8 x 0.6 cm.  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  RECOMMENDATION: Left ultrasound-guided core biopsy  I have discussed the findings and recommendations with the patient. Results were also provided in writing at the conclusion of the visit. If applicable, a reminder letter will be sent to the patient regarding the next appointment.  BI-RADS CATEGORY 4: Suspicious.   Electronically Signed By: Gretchen Green M.D. On: 06/14/2015 16:39  CLINICAL DATA: 23-year-old female with palpable lump in the lower outer quadrant of the left breast.  EXAM: ULTRASOUND GUIDED LEFT BREAST CORE NEEDLE BIOPSY  COMPARISON: Previous exam(s).  PROCEDURE: I met with the patient and we discussed  the procedure of ultrasound-guided biopsy, including benefits and alternatives. We discussed the high likelihood of a successful procedure. We discussed the risks of the procedure including infection, bleeding, tissue injury, clip migration, and inadequate sampling. Informed written consent was given. The usual time-out protocol was performed immediately prior to the procedure.  Using sterile technique and 1% Lidocaine as local anesthetic, under direct ultrasound visualization, a 12 gauge vacuum-assisted device was used to perform biopsy of palpable mass at the 530 locationusing a lateral approach. At the conclusion of the procedure, a ribbon shaped tissue marker clip was deployed into the biopsy cavity.  IMPRESSION: Ultrasound-guided biopsy of the palpable mass at the 530 location in the left breast. No apparent complications.  Electronically Signed: By: Michelle Collins M.D. 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 This was found to be concordant by Dr. Michelle Collins. Pathology was discussed with the patient by telephone. She reported doing well after the biopsy with tenderness at the site. Post biopsy instructions and care were reviewed and her questions were answered. The patient requested a surgical consultation and an appointment was made for her with Dr. Matthew Tsuei at [facility] on July 08, 2015.  Pathology results reported by Leigh Kuhnly RN, BSN on June 25, 2015.   Electronically Signed By: Michelle Collins M.D. On: 06/25/2015 08:15 Other Problems (Ashley Beck, CMA; 07/08/2015 9:54 AM) Arthritis Asthma  Past Surgical History (Ashley Beck, CMA; 07/08/2015 9:54 AM) No pertinent past surgical history  Diagnostic Studies History (Ashley Beck, CMA; 07/08/2015 9:54 AM) Colonoscopy 1-5  years ago   Mammogram never Pap Smear 1-5 years ago  Allergies Elbert Ewings, CMA; 07/08/2015 9:54 AM) No Known Drug Allergies 07/08/2015  Medication History Elbert Ewings, CMA; 07/08/2015 9:55 AM) Albuterol Sulfate HFA (108 (90 Base)MCG/ACT Aerosol Soln, Inhalation as needed) Active. Medications Reconciled  Social History Elbert Ewings, [place]; 07/08/2015 9:54 AM) Alcohol use Occasional alcohol use. Caffeine use Coffee. No drug use Tobacco use Current every day smoker.  Family History Elbert Ewings, [place]; 07/08/2015 9:54 AM) Arthritis Mother. Bleeding disorder Sister. Breast Cancer Family Members In General. Ovarian Cancer Family Members In General. Seizure disorder Father.  Pregnancy / Birth History Elbert Ewings, CMA; 07/08/2015 9:54 AM) Age at menarche 89 years. Contraceptive History Contraceptive implant. Gravida 1 Irregular periods Maternal age 39-20 Para 0     Review of Systems Elbert Ewings CMA; 07/08/2015 9:54 AM) General Not Present- Appetite Loss, Chills, Fatigue, Fever, Night Sweats, Weight Gain and Weight Loss. Skin Not Present- Change in Wart/Mole, Dryness, Hives, Jaundice, New Lesions, Non-Healing Wounds, Rash and Ulcer. HEENT Not Present- Earache, Hearing Loss, Hoarseness, Nose Bleed, Oral Ulcers, Ringing in the Ears, Seasonal Allergies, Sinus Pain, Sore Throat, Visual Disturbances, Wears glasses/contact lenses and Yellow Eyes. Respiratory Not Present- Bloody sputum, Chronic Cough, Difficulty Breathing, Snoring and Wheezing. Breast Present- Breast Mass and Breast Pain. Not Present- Nipple Discharge and Skin Changes. Cardiovascular Not Present- Chest Pain, Difficulty Breathing Lying Down, Leg Cramps, Palpitations, Rapid Heart Rate, Shortness of Breath and Swelling of Extremities. Gastrointestinal Not Present- Abdominal Pain, Bloating, Bloody Stool, Change in Bowel Habits, Chronic diarrhea, Constipation, Difficulty Swallowing, Excessive gas, Gets full quickly  at meals, Hemorrhoids, Indigestion, Nausea, Rectal Pain and Vomiting. Female Genitourinary Not Present- Frequency, Nocturia, Painful Urination, Pelvic Pain and Urgency. Musculoskeletal Not Present- Back Pain, Joint Pain, Joint Stiffness, Muscle Pain, Muscle Weakness and Swelling of Extremities. Neurological Not Present- Decreased Memory, Fainting, Headaches, Numbness, Seizures, Tingling, Tremor, Trouble walking and Weakness. Psychiatric Not Present- Anxiety, Bipolar, Change in Sleep Pattern, Depression, Fearful and Frequent crying. Endocrine Not Present- Cold Intolerance, Excessive Hunger, Hair Changes, Heat Intolerance, Hot flashes and New Diabetes. Hematology Not Present- Easy Bruising, Excessive bleeding, Gland problems, HIV and Persistent Infections.  Vitals Elbert Ewings CMA; 07/08/2015 9:55 AM) 07/08/2015 9:55 AM Weight: 103 lb Height: 63in Body Surface Area: 1.44 m Body Mass Index: 18.25 kg/m Temp.: 97.86F(Temporal)  BP: 110/70 (Sitting, Left Arm, Standard)     Physical Exam Rodman Key K. Neveyah Garzon MD; 07/08/2015 10:55 AM)  The physical exam findings are as follows: Note:WDWN in NAD HEENT: EOMI, sclera anicteric Neck: No masses, no thyromegaly Lungs: CTA bilaterally; normal respiratory effort Chest - no palpable right breast masses; left breast 5:30 - 2 cm easily palpable subcutaneous mass; mobile, smooth CV: Regular rate and rhythm; no murmurs Abd: +bowel sounds, soft, non-tender, no masses Ext: Well-perfused; no edema Skin: Warm, dry; no sign of jaundice    Assessment & Plan Rodman Key K. Yale Golla MD; 07/08/2015 10:13 AM)  Rodman Comp, LEFT (217  D24.2)  Current Plans Schedule for Surgery - left breast lumpectomy. The surgical procedure has been discussed with the patient. Potential risks, benefits, alternative treatments, and expected outcomes have been explained. All of the patient's questions at this time have been answered. The likelihood of reaching the patient's  treatment goal is good. The patient understand the proposed surgical procedure and wishes to proceed. Pt Education - Breast Cancer in Women *: breast lumps  Imogene Burn. Georgette Dover, MD, [facility]  General/ Trauma Surgery  07/08/2015 10:56 AM

## 2015-07-11 HISTORY — PX: BREAST EXCISIONAL BIOPSY: SUR124

## 2015-08-27 ENCOUNTER — Other Ambulatory Visit: Payer: Self-pay | Admitting: Surgery

## 2015-11-10 HISTORY — PX: TONSILLECTOMY: SUR1361

## 2015-11-10 HISTORY — PX: WISDOM TOOTH EXTRACTION: SHX21

## 2015-12-31 ENCOUNTER — Ambulatory Visit: Payer: Self-pay | Admitting: Internal Medicine

## 2016-01-02 ENCOUNTER — Ambulatory Visit (INDEPENDENT_AMBULATORY_CARE_PROVIDER_SITE_OTHER): Payer: BLUE CROSS/BLUE SHIELD | Admitting: Internal Medicine

## 2016-01-02 ENCOUNTER — Encounter: Payer: Self-pay | Admitting: Internal Medicine

## 2016-01-02 VITALS — BP 106/66 | HR 88 | Temp 98.6°F | Ht 63.5 in | Wt 108.0 lb

## 2016-01-02 DIAGNOSIS — F418 Other specified anxiety disorders: Secondary | ICD-10-CM

## 2016-01-02 DIAGNOSIS — G43909 Migraine, unspecified, not intractable, without status migrainosus: Secondary | ICD-10-CM | POA: Insufficient documentation

## 2016-01-02 DIAGNOSIS — R519 Headache, unspecified: Secondary | ICD-10-CM

## 2016-01-02 DIAGNOSIS — J4520 Mild intermittent asthma, uncomplicated: Secondary | ICD-10-CM

## 2016-01-02 DIAGNOSIS — F419 Anxiety disorder, unspecified: Secondary | ICD-10-CM

## 2016-01-02 DIAGNOSIS — R51 Headache: Secondary | ICD-10-CM

## 2016-01-02 DIAGNOSIS — F329 Major depressive disorder, single episode, unspecified: Secondary | ICD-10-CM

## 2016-01-02 DIAGNOSIS — F32A Depression, unspecified: Secondary | ICD-10-CM | POA: Insufficient documentation

## 2016-01-02 MED ORDER — ESCITALOPRAM OXALATE 10 MG PO TABS: 10.0000 mg | ORAL_TABLET | Freq: Every day | ORAL | Status: DC

## 2016-01-02 NOTE — Assessment & Plan Note (Signed)
Intermittent cough and SOB Exam benign today Advised her to stop smoking

## 2016-01-02 NOTE — Assessment & Plan Note (Signed)
?   Stress related Discussed relaxation techniques- handout given Ok to take Advil as needed, just avoid overuse

## 2016-01-02 NOTE — Assessment & Plan Note (Signed)
Seems like this has been going on for a while Support offered today Discussed treatment options with therapy/medication She declines therapy at this time due to financial strain eRx for Lexapro 10 mg QHS  Follow up in 6 weeks

## 2016-01-02 NOTE — Progress Notes (Signed)
Pre visit review using our clinic review tool, if applicable. No additional management support is needed unless otherwise documented below in the visit note. 

## 2016-01-02 NOTE — Patient Instructions (Signed)

## 2016-01-02 NOTE — Progress Notes (Signed)
HPI  Pt presents to the clinic today to establish care and for management of the conditions listed below. She is transferring care from Dr. Maudie Mercury at [facility].   Asthma: Mild intermittent. She has had an occasional shortness of breath and cough. She has an Albuterol inhaler but reports it does not provide any relief.  Frequent Headaches: Usually occur 1 x week. They occur on the top of her head. She describes the pain as "poking with pins". She denies sensitivity to light and sounds. She denies nausea or vomiting. She does not take anything OTC, she just deals with it.  Depression: She has never been diagnosed with this in the past. This is triggered by financial strain. She does worry and is anxious about this. She denies SI/HI.  Flu: never Tetanus: > 10 years ago Pap Smear: 11/2014, Dr. Gertie Fey Dentist: as needed  Past Medical History  Diagnosis Date  . Asthma   . Chicken pox   . Frequent headaches     Current Outpatient Prescriptions  Medication Sig Dispense Refill  . vitamin B-12 (CYANOCOBALAMIN) 500 MCG tablet Take 500 mcg by mouth daily.     No current facility-administered medications for this visit.    No Known Allergies  History reviewed. No pertinent family history.  Social History   Social History  . Marital Status: Single    Spouse Name: N/A  . Number of Children: N/A  . Years of Education: N/A   Occupational History  . Not on file.   Social History Main Topics  . Smoking status: Current Every Day Smoker    Types: Cigars  . Smokeless tobacco: Not on file     Comment: 1 cigar daily  . Alcohol Use: 0.0 oz/week    0 Standard drinks or equivalent per week     Comment: occasional  . Drug Use: No  . Sexual Activity: Not on file   Other Topics Concern  . Not on file   Social History Narrative  . No narrative on file    ROS:  Constitutional: Denies fever, malaise, fatigue, headache or abrupt weight changes.  HEENT: Denies eye pain, eye  redness, ear pain, ringing in the ears, wax buildup, runny nose, nasal congestion, bloody nose, or sore throat. Respiratory: Pt reports cough and shortness of breath. Denies difficulty breathing, or sputum production.   Cardiovascular: Denies chest pain, chest tightness, palpitations or swelling in the hands or feet.  Gastrointestinal: Denies abdominal pain, bloating, constipation, diarrhea or blood in the stool.  GU: Denies frequency, urgency, pain with urination, blood in urine, odor or discharge. Musculoskeletal: Denies decrease in range of motion, difficulty with gait, muscle pain or joint pain and swelling.  Skin: Denies redness, rashes, lesions or ulcercations.  Neurological: Denies dizziness, difficulty with memory, difficulty with speech or problems with balance and coordination.  Psych: Pt reports anxiety and depression. Denies SI/HI.  No other specific complaints in a complete review of systems (except as listed in HPI above).  PE:  BP 106/66 mmHg  Pulse 88  Temp(Src) 98.6 F (37 C) (Oral)  Ht 5' 3.5" (1.613 m)  Wt 108 lb (48.988 kg)  BMI 18.83 kg/m2  LMP 12/16/2015  Wt Readings from Last 3 Encounters:  01/02/16 108 lb (48.988 kg)    General: Appears her stated age, well developed, well nourished in NAD. Skin: Dry and intact. HEENT: Head: normal shape and size; Throat/Mouth: Teeth present, mucosa pink and moist, no lesions or ulcerations noted.  Neck: No adenopathy noted.  Cardiovascular: Normal rate and rhythm. S1,S2 noted.  No murmur, rubs or gallops noted.  Pulmonary/Chest: Normal effort and positive vesicular breath sounds. No respiratory distress. No wheezes, rales or ronchi noted.   Neurological: Alert and oriented. Psychiatric: Mood and affect normal. Behavior is normal. Judgment and thought content normal.    Assessment and Plan:

## 2016-01-14 ENCOUNTER — Telehealth: Payer: Self-pay

## 2016-01-14 NOTE — Telephone Encounter (Signed)
Pt has been taking lexapro; lexapro 10 mg before hs causing nausea; no vomiting and no other symptoms or problems.Pt wants to know if there is substitute med to try. [facility] [address] Pt request cb.

## 2016-01-14 NOTE — Telephone Encounter (Signed)
Have her try cutting it in half to see if this makes a difference. Have her also eat it with a snack.

## 2016-01-14 NOTE — Telephone Encounter (Signed)
Pt is aware as instructed--- advised to call back to update if Sx still occur after trying 1/2 tablet for the next couple of days

## 2016-02-13 ENCOUNTER — Encounter: Payer: Self-pay | Admitting: Internal Medicine

## 2016-02-13 ENCOUNTER — Telehealth: Payer: Self-pay | Admitting: Internal Medicine

## 2016-02-13 ENCOUNTER — Ambulatory Visit (INDEPENDENT_AMBULATORY_CARE_PROVIDER_SITE_OTHER): Payer: BLUE CROSS/BLUE SHIELD | Admitting: Internal Medicine

## 2016-02-13 VITALS — BP 104/66 | HR 88 | Temp 98.7°F | Wt 110.0 lb

## 2016-02-13 DIAGNOSIS — M7989 Other specified soft tissue disorders: Secondary | ICD-10-CM

## 2016-02-13 DIAGNOSIS — F32A Depression, unspecified: Secondary | ICD-10-CM

## 2016-02-13 DIAGNOSIS — F419 Anxiety disorder, unspecified: Principal | ICD-10-CM

## 2016-02-13 DIAGNOSIS — F329 Major depressive disorder, single episode, unspecified: Secondary | ICD-10-CM

## 2016-02-13 DIAGNOSIS — M79641 Pain in right hand: Secondary | ICD-10-CM

## 2016-02-13 DIAGNOSIS — F418 Other specified anxiety disorders: Secondary | ICD-10-CM

## 2016-02-13 MED ORDER — ESCITALOPRAM OXALATE 20 MG PO TABS: 20.0000 mg | ORAL_TABLET | Freq: Every day | ORAL | Status: DC

## 2016-02-13 NOTE — Progress Notes (Signed)
Subjective:    Patient ID: Kristen Ball, female    DOB: 1991-06-03, 25 y.o.   MRN: [id number]  HPI  Pt presents to the clinic today for f/u of anxiety and depression. She was started on 10 mg Lexapro, 12/2015. When she first started taking it, she experienced nausea. She was advised to cut it in half x 1 week and take it with food. She was able to work herself back up to 10 mg daily and is no longer experiencing nausea. She does not feel the Lexapro has helped her depression. She denies SI/HI. She has missed some work because she is struggling getting out of bed secondary to her depression. She brought FMLA forms to be filled out today.  Additionally, she reports several episodes of right hand pain and swelling. She was seen in the ER within the last year and had an xray of her hand . 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 She takes Aleve as needed. She denies family history of RA.  Review of Systems  Past Medical History  Diagnosis Date  . Asthma   . Chicken pox   . Frequent headaches     Current Outpatient Prescriptions  Medication Sig Dispense Refill  . vitamin B-12 (CYANOCOBALAMIN) 500 MCG tablet Take 500 mcg by mouth daily.    Marland Kitchen escitalopram (LEXAPRO) 20 MG tablet Take 1 tablet (20 mg total) by mouth daily. 30 tablet 1   No current facility-administered medications for this visit.    No Known Allergies  Family History  Problem Relation Age of Onset  . Arthritis Mother   . Breast cancer Maternal Aunt   . Colon cancer Maternal Grandmother     Social History   Social History  . Marital Status: Single    Spouse Name: N/A  . Number of Children: N/A  . Years of Education: N/A   Occupational History  . Not on file.   Social History Main Topics  . Smoking status: Current Every Day Smoker    Types: Cigars  . Smokeless tobacco: Not on file     Comment: 1 cigar daily  . Alcohol Use: 0.0 oz/week    0 Standard drinks or equivalent per week     Comment:  occasional  . Drug Use: No  . Sexual Activity: Yes   Other Topics Concern  . Not on file   Social History Narrative     Constitutional: Positive for fatigue. Denies weight changes. Respiratory: Denies difficulty breathing or shortness of breath. Cardiovascular: Denies chest pain, chest tightness, or palpitations. Musculoskeletal: Positive right hand pain and swelling. Denies decrease in ROM. Psych: Pt has history of anxiety and depression. Denies SI/HI.  No other specific complaints in a complete review of systems (except as listed in HPI above).     Objective:   Physical Exam BP 104/66 mmHg  Pulse 88  Temp(Src) 98.7 F (37.1 C) (Oral)  Wt 110 lb (49.896 kg)  LMP 01/31/2016 Wt Readings from Last 3 Encounters:  02/13/16 110 lb (49.896 kg)  01/02/16 108 lb (48.988 kg)    General: Appears her stated age, well developed, well nourished in NAD. Skin: Warm, dry and intact. No rashes, lesions or ulcerations noted. Neck:  Neck supple, trachea midline. No thyromegaly. Musculoskeletal: Normal flexion and extension of the fingers of the hand. No signs of joint swelling. Hand grips equal. Neurological: Alert and oriented.  Psychiatric: Mood and affect normal. Behavior is normal. Judgment and thought content normal.  Assessment & Plan:   Anxiety and Depression  Support offered today Increase Lexapro to 20 mg daily If decreased depression and no side effects, send message via MyChart in 1 month and no apt needed. If no improvement or increased side effects, schedule apt in 1 month Discussed good sleep hygiene   Right hand pain and swelling:  No xray in EMR for review Use NSAIDs prn  RTC in 1 month if needed

## 2016-02-13 NOTE — Telephone Encounter (Signed)
fmla paperwork °In [facility] IN BOX °For review and signature ° ° ° °

## 2016-02-13 NOTE — Patient Instructions (Signed)
Major Depressive Disorder Major depressive disorder is a mental illness. It also may be called clinical depression or unipolar depression. Major depressive disorder usually causes feelings of sadness, hopelessness, or helplessness. Some people with this disorder do not feel particularly sad but lose interest in doing things they used to enjoy (anhedonia). Major depressive disorder also can cause physical symptoms. It can interfere with work, school, relationships, and other normal everyday activities. The disorder varies in severity but is longer lasting and more serious than the sadness we all feel from time to time in our lives. Major depressive disorder often is triggered by stressful life events or major life changes. Examples of these triggers include divorce, loss of your job or home, a move, and the death of a family member or close friend. Sometimes this disorder occurs for no obvious reason at all. People who have family members with major depressive disorder or bipolar disorder are at higher risk for developing this disorder, with or without life stressors. Major depressive disorder can occur at any age. It may occur just once in your life (single episode major depressive disorder). It may occur multiple times (recurrent major depressive disorder). SYMPTOMS People with major depressive disorder have either anhedonia or depressed mood on nearly a daily basis for at least 2 weeks or longer. Symptoms of depressed mood include:  Feelings of sadness (blue or down in the dumps) or emptiness.  Feelings of hopelessness or helplessness.  Tearfulness or episodes of crying (may be observed by others).  Irritability (children and adolescents). In addition to depressed mood or anhedonia or both, people with this disorder have at least four of the following symptoms:  Difficulty sleeping or sleeping too much.   Significant change (increase or decrease) in appetite or weight.   Lack of energy or  motivation.  Feelings of guilt and worthlessness.   Difficulty concentrating, remembering, or making decisions.  Unusually slow movement (psychomotor retardation) or restlessness (as observed by others).   Recurrent wishes for death, recurrent thoughts of self-harm (suicide), or a suicide attempt. People with major depressive disorder commonly have persistent negative thoughts about themselves, other people, and the world. People with severe major depressive disorder may experiencedistorted beliefs or perceptions about the world (psychotic delusions). They also may see or hear things that are not real (psychotic hallucinations). DIAGNOSIS Major depressive disorder is diagnosed through an assessment by your health care provider. Your health care provider will ask aboutaspects of your daily life, such as mood,sleep, and appetite, to see if you have the diagnostic symptoms of major depressive disorder. Your health care provider may ask about your medical history and use of alcohol or drugs, including prescription medicines. Your health care provider also may do a physical exam and blood work. This is because certain medical conditions and the use of certain substances can cause major depressive disorder-like symptoms (secondary depression). Your health care provider also may refer you to a mental health specialist for further evaluation and treatment. TREATMENT It is important to recognize the symptoms of major depressive disorder and seek treatment. The following treatments can be prescribed for this disorder:   Medicine. Antidepressant medicines usually are prescribed. Antidepressant medicines are thought to correct chemical imbalances in the brain that are commonly associated with major depressive disorder. Other types of medicine may be added if the symptoms do not respond to antidepressant medicines alone or if psychotic delusions or hallucinations occur.  Talk therapy. Talk therapy can be  helpful in treating major depressive disorder by providing   support, education, and guidance. Certain types of talk therapy also can help with negative thinking (cognitive behavioral therapy) and with relationship issues that trigger this disorder (interpersonal therapy). A mental health specialist can help determine which treatment is best for you. Most people with major depressive disorder do well with a combination of medicine and talk therapy. Treatments involving electrical stimulation of the brain can be used in situations with extremely severe symptoms or when medicine and talk therapy do not work over time. These treatments include electroconvulsive therapy, transcranial magnetic stimulation, and vagal nerve stimulation.   This information is not intended to replace advice given to you by your health care provider. Make sure you discuss any questions you have with your health care provider.   Document Released: 02/20/2013 Document Revised: 11/16/2014 Document Reviewed: 02/20/2013 Elsevier Interactive Patient Education 2016 [employer]  

## 2016-02-13 NOTE — Progress Notes (Signed)
Pre visit review using our clinic review tool, if applicable. No additional management support is needed unless otherwise documented below in the visit note. 

## 2016-02-17 NOTE — Telephone Encounter (Signed)
Left message letting pt know paperwork has been completed and ready for pick up Pt will need to turn in paperwork  Copy for pt Copy for file Copy for scan

## 2016-02-17 NOTE — Telephone Encounter (Signed)
Done, given back to Robin 

## 2016-03-18 ENCOUNTER — Encounter: Payer: Self-pay | Admitting: Internal Medicine

## 2016-04-08 ENCOUNTER — Telehealth: Payer: Self-pay | Admitting: Internal Medicine

## 2016-04-08 NOTE — Telephone Encounter (Signed)
Rec'd from [facility] forward 5 pages to [facility]

## 2016-04-10 ENCOUNTER — Encounter: Payer: Self-pay | Admitting: Family Medicine

## 2016-04-10 ENCOUNTER — Encounter: Payer: Self-pay | Admitting: Internal Medicine

## 2016-04-13 ENCOUNTER — Other Ambulatory Visit: Payer: Self-pay | Admitting: Internal Medicine

## 2016-04-13 MED ORDER — SERTRALINE HCL 50 MG PO TABS: ORAL_TABLET | ORAL | Status: DC

## 2016-05-04 NOTE — Telephone Encounter (Signed)
Pt called stating she needed the date where you signed her paperwork changed to today's date Pt stated in April she had not been there long enough for fmla and they denied.

## 2016-05-04 NOTE — Telephone Encounter (Signed)
That is illegal. I am not doing that.

## 2016-05-04 NOTE — Telephone Encounter (Signed)
Pt aware.

## 2016-05-07 ENCOUNTER — Encounter: Payer: Self-pay | Admitting: Internal Medicine

## 2016-05-07 ENCOUNTER — Ambulatory Visit (INDEPENDENT_AMBULATORY_CARE_PROVIDER_SITE_OTHER): Payer: BLUE CROSS/BLUE SHIELD | Admitting: Internal Medicine

## 2016-05-07 ENCOUNTER — Telehealth: Payer: Self-pay | Admitting: Internal Medicine

## 2016-05-07 VITALS — BP 96/60 | HR 64 | Temp 99.4°F | Wt 109.0 lb

## 2016-05-07 DIAGNOSIS — R51 Headache: Secondary | ICD-10-CM

## 2016-05-07 DIAGNOSIS — R519 Headache, unspecified: Secondary | ICD-10-CM

## 2016-05-07 MED ORDER — SUMATRIPTAN SUCCINATE 25 MG PO TABS: 25.0000 mg | ORAL_TABLET | ORAL | Status: DC | PRN

## 2016-05-07 NOTE — Patient Instructions (Signed)

## 2016-05-07 NOTE — Telephone Encounter (Signed)
Pt dropped FMLA forms In [facility] In Box For review and signature

## 2016-05-07 NOTE — Progress Notes (Signed)
Subjective:    Patient ID: Kristen Ball, female    DOB: 1991-03-22, 25 y.o.   MRN: [id number]  HPI  Pt presents to the clinic today to follow up frequent headaches. The started 2 weeks ago and she has had 2-3 per week. They are located on the left side of her head, which concerns her because it is different than her usual headaches. The pain is also more intense, sharp and stabbing. She does have some sensitivity to light. She denies sensitivity to sounds. She denies nausea or vomiting. She denies blurred vision or dizziness. She has tried Ibuprofen and Tylenol with minimal relief. 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 She sleeps about 6 hours per night. She does report increased stress, but reports she stopped her Zoloft a few months ago. She reports her maternal aunt has a history of a brain aneurysm.   Review of Systems      Past Medical History  Diagnosis Date  . Migraines   . Asthma   . Chicken pox   . Frequent headaches     Current Outpatient Prescriptions  Medication Sig Dispense Refill  . albuterol (PROVENTIL HFA;VENTOLIN HFA) 108 (90 BASE) MCG/ACT inhaler Inhale 2 puffs into the lungs every 6 (six) hours as needed for wheezing or shortness of breath. 1 Inhaler 0  . sertraline (ZOLOFT) 50 MG tablet Take 1/2 tab daily x 2 weeks, then increase to a 1 tab daily. 30 tablet 2  . vitamin B-12 (CYANOCOBALAMIN) 500 MCG tablet Take 500 mcg by mouth daily.     No current facility-administered medications for this visit.    No Known Allergies  Family History  Problem Relation Age of Onset  . Diabetes Paternal Grandmother   . Hypertension Paternal Grandfather   . Arthritis Mother   . Breast cancer Maternal Aunt   . Colon cancer Maternal Grandmother     Social History   Social History  . Marital Status: Single    Spouse Name: N/A  . Number of Children: N/A  . Years of Education: N/A   Occupational History  . Not on file.   Social History Main Topics    . Smoking status: Current Every Day Smoker    Types: Cigars  . Smokeless tobacco: Not on file     Comment: 1 cigar daily  . Alcohol Use: 0.0 oz/week    0 Standard drinks or equivalent per week     Comment: occ  . Drug Use: No  . Sexual Activity: Yes   Other Topics Concern  . Not on file   Social History Narrative   ** Merged History Encounter **       Work or School: works at [employer]      Home Situation: lives alone       Spiritual Beliefs: none      Lifestyle: no regular exercising; diet is poor              Constitutional: Pt reports headaches. Denies fever, malaise, fatigue, or abrupt weight changes.  HEENT: Denies eye pain, eye redness, ear pain, ringing in the ears, wax buildup, runny nose, nasal congestion, bloody nose, or sore throat. Respiratory: Denies difficulty breathing, shortness of breath, cough or sputum production.   Cardiovascular: Denies chest pain, chest tightness, palpitations or swelling in the hands or feet.  Neurological: Denies dizziness, difficulty with memory, difficulty with speech or problems with balance and coordination.  Psych: Pt reports stress with history of  anxiety and depression. Denies SI/HI.  No other specific complaints in a complete review of systems (except as listed in HPI above).  Objective:   Physical Exam  BP 96/60 mmHg  Pulse 64  Temp(Src) 99.4 F (37.4 C) (Oral)  Wt 109 lb (49.442 kg)  LMP 03/02/2016 Wt Readings from Last 3 Encounters:  05/07/16 109 lb (49.442 kg)  02/13/16 110 lb (49.896 kg)  01/02/16 108 lb (48.988 kg)    General: Appears ther stated age, well developed, well nourished in NAD. HEENT: Head: normal shape and size; Eyes: sclera white, no icterus, conjunctiva pink, PERRLA and EOMs intact;  Cardiovascular: Normal rate and rhythm. S1,S2 noted.  No murmur, rubs or gallops noted.  Pulmonary/Chest: Normal effort and positive vesicular breath sounds. No respiratory distress. No wheezes, rales or  ronchi noted.  Neurological: Alert and oriented.  Coordination normal.  Psychiatric: Mood and affect normal. Behavior is normal. Judgment and thought content normal.         Assessment & Plan:

## 2016-05-07 NOTE — Progress Notes (Signed)
Pre visit review using our clinic review tool, if applicable. No additional management support is needed unless otherwise documented below in the visit note. 

## 2016-05-07 NOTE — Assessment & Plan Note (Signed)
Sound more like typical migraines, possibly stress related Will try Imitrex  Follow up with me in 4 weeks if no improvement

## 2016-05-07 NOTE — Telephone Encounter (Signed)
Done given back to Kristen Ball 

## 2016-05-08 NOTE — Telephone Encounter (Signed)
Pt aware paperwork is ready Pt is also aware she will need to turn paperwork in Copy for pt Copy for file Copy for scan

## 2016-05-15 ENCOUNTER — Encounter: Payer: Self-pay | Admitting: Internal Medicine

## 2016-05-15 ENCOUNTER — Emergency Department (HOSPITAL_COMMUNITY)
Admission: EM | Admit: 2016-05-15 | Discharge: 2016-05-15 | Disposition: A | Discharge status: Home / Self Care | Payer: BLUE CROSS/BLUE SHIELD | Attending: Emergency Medicine | Admitting: Emergency Medicine

## 2016-05-15 ENCOUNTER — Encounter (HOSPITAL_COMMUNITY): Payer: Self-pay | Admitting: *Deleted

## 2016-05-15 DIAGNOSIS — R519 Headache, unspecified: Secondary | ICD-10-CM

## 2016-05-15 DIAGNOSIS — Z79899 Other long term (current) drug therapy: Secondary | ICD-10-CM | POA: Insufficient documentation

## 2016-05-15 DIAGNOSIS — J45909 Unspecified asthma, uncomplicated: Secondary | ICD-10-CM | POA: Insufficient documentation

## 2016-05-15 DIAGNOSIS — F17210 Nicotine dependence, cigarettes, uncomplicated: Secondary | ICD-10-CM | POA: Diagnosis not present

## 2016-05-15 DIAGNOSIS — R51 Headache: Secondary | ICD-10-CM | POA: Insufficient documentation

## 2016-05-15 LAB — CBC WITH DIFFERENTIAL/PLATELET
BASOS PCT: 0 %
Basophils Absolute: 0 10*3/uL (ref 0.0–0.1)
EOS ABS: 0.2 10*3/uL (ref 0.0–0.7)
Eosinophils Relative: 2 %
HEMATOCRIT: 40.8 % (ref 36.0–46.0)
HEMOGLOBIN: 13.2 g/dL (ref 12.0–15.0)
Lymphocytes Relative: 37 %
Lymphs Abs: 3 10*3/uL (ref 0.7–4.0)
MCH: 29.1 pg (ref 26.0–34.0)
MCHC: 32.4 g/dL (ref 30.0–36.0)
MCV: 90.1 fL (ref 78.0–100.0)
Monocytes Absolute: 0.5 10*3/uL (ref 0.1–1.0)
Monocytes Relative: 6 %
NEUTROS ABS: 4.4 10*3/uL (ref 1.7–7.7)
NEUTROS PCT: 55 %
Platelets: 241 10*3/uL (ref 150–400)
RBC: 4.53 MIL/uL (ref 3.87–5.11)
RDW: 12.2 % (ref 11.5–15.5)
WBC: 8.1 10*3/uL (ref 4.0–10.5)

## 2016-05-15 LAB — BASIC METABOLIC PANEL
ANION GAP: 7 (ref 5–15)
BUN: 8 mg/dL (ref 6–20)
CHLORIDE: 103 mmol/L (ref 101–111)
CO2: 26 mmol/L (ref 22–32)
Calcium: 9.8 mg/dL (ref 8.9–10.3)
Creatinine, Ser: 0.57 mg/dL (ref 0.44–1.00)
GFR calc non Af Amer: >60 mL/min (ref >60)
Glucose, Bld: 101 mg/dL — ABNORMAL HIGH (ref 65–99)
POTASSIUM: 3.6 mmol/L (ref 3.5–5.1)
SODIUM: 136 mmol/L (ref 135–145)

## 2016-05-15 LAB — I-STAT BETA HCG BLOOD, ED (MC, WL, AP ONLY)

## 2016-05-15 MED ORDER — METOCLOPRAMIDE HCL 5 MG/ML IJ SOLN
10.0000 mg | Freq: Once | INTRAMUSCULAR | Status: AC
  Administered 2016-05-15: 10 mg via INTRAVENOUS
  Filled 2016-05-15: qty 2

## 2016-05-15 MED ORDER — DIPHENHYDRAMINE HCL 50 MG/ML IJ SOLN
25.0000 mg | Freq: Once | INTRAMUSCULAR | Status: AC
  Administered 2016-05-15: 25 mg via INTRAVENOUS
  Filled 2016-05-15: qty 1

## 2016-05-15 MED ORDER — SODIUM CHLORIDE 0.9 % IV BOLUS (SEPSIS)
1000.0000 mL | Freq: Once | INTRAVENOUS | Status: AC
  Administered 2016-05-15: 1000 mL via INTRAVENOUS

## 2016-05-15 MED ORDER — OXYCODONE-ACETAMINOPHEN 5-325 MG PO TABS
1.0000 | ORAL_TABLET | ORAL | Status: DC | PRN
Start: 2016-05-15 — End: 2016-05-15
  Administered 2016-05-15: 1 via ORAL

## 2016-05-15 MED ORDER — PROMETHAZINE HCL 25 MG/ML IJ SOLN
25.0000 mg | Freq: Once | INTRAMUSCULAR | Status: DC
  Filled 2016-05-15: qty 1

## 2016-05-15 MED ORDER — OXYCODONE-ACETAMINOPHEN 5-325 MG PO TABS
ORAL_TABLET | ORAL | Status: AC
  Filled 2016-05-15: qty 1

## 2016-05-15 NOTE — Discharge Instructions (Signed)
Continue tylenol, motrin for headaches.   Continue imitrex as needed.   See your doctor.   You have frequent headaches and will need to see neurology for follow up   Return to ER if you have severe headaches, vomiting, blurry vision

## 2016-05-15 NOTE — ED Notes (Signed)
Pt reports having severe headache x 3 hours. Having nausea, no vomiting. Recent intermittent headaches that she is being treated for, takes sumatriptan with no relief. Pt is tearful at triage.

## 2016-05-15 NOTE — ED Provider Notes (Signed)
CSN: [id number]     Arrival date & time 05/15/16  99 History   First MD Initiated Contact with Patient 05/15/16 2039     Chief Complaint  Patient presents with  . Headache     (Consider location/radiation/quality/duration/timing/severity/associated sxs/prior Treatment) The history is provided by the patient.  Kristen Ball is a 25 y.o. female hx of asthma, migraines, headaches, here with headaches. Patient states that she has been worsening headaches for the last week or so. She saw her primary care doctor about a week ago and was prescribed Imitrex. She took a dose of Imitrex yesterday and helped minimally. She Had left-sided headache for the last 3 hours. States that this is similar to previous headaches but it didn't go away by itself. She has some nausea but no vomiting. Denies blurry vision. Never saw neurology.   Past Medical History  Diagnosis Date  . Migraines   . Asthma   . Chicken pox   . Frequent headaches    Past Surgical History  Procedure Laterality Date  . Dilation and curettage of uterus  2011  . Dilation and curettage of uterus     Family History  Problem Relation Age of Onset  . Diabetes Paternal Grandmother   . Hypertension Paternal Grandfather   . Arthritis Mother   . Breast cancer Maternal Aunt   . Colon cancer Maternal Grandmother    Social History  Substance Use Topics  . Smoking status: Current Every Day Smoker    Types: Cigars  . Smokeless tobacco: None     Comment: 1 cigar daily  . Alcohol Use: 0.0 oz/week    0 Standard drinks or equivalent per week     Comment: moderate   OB History    Gravida Para Term Preterm AB TAB SAB Ectopic Multiple Living   0 0 0 0 0 0 0 0       Review of Systems  Neurological: Positive for headaches.  All other systems reviewed and are negative.     Allergies  Review of patient's allergies indicates no known allergies.  Home Medications   Prior to Admission medications   Medication Sig Start Date End  Date Taking? Authorizing Provider  etonogestrel (NEXPLANON) 68 MG IMPL implant 1 each by Subdermal route once.   Yes Historical Provider, MD  sertraline (ZOLOFT) 50 MG tablet Take 1/2 tab daily x 2 weeks, then increase to a 1 tab daily. Patient taking differently: Take 25 mg by mouth daily. Take 1/2 tab daily x 2 weeks, then increase to a 1 tab daily. 04/13/16  Yes Jearld Fenton, NP  SUMAtriptan (IMITREX) 25 MG tablet Take 1 tablet (25 mg total) by mouth every 2 (two) hours as needed for migraine. May repeat in 2 hours if headache persists or recurs. 05/07/16  Yes Jearld Fenton, NP  albuterol (PROVENTIL HFA;VENTOLIN HFA) 108 (90 BASE) MCG/ACT inhaler Inhale 2 puffs into the lungs every 6 (six) hours as needed for wheezing or shortness of breath. 01/02/14   Lucretia Kern, DO   BP 111/74 mmHg  Pulse 72  Temp(Src) 98.4 F (36.9 C) (Oral)  Resp 18  Ht 5\' 4"  (1.626 m)  Wt 103 lb (46.72 kg)  BMI 17.67 kg/m2  SpO2 99%  LMP 03/02/2016 Physical Exam  Constitutional: She is oriented to person, place, and time.  Uncomfortable   HENT:  Head: Normocephalic.  Mouth/Throat: Oropharynx is clear and moist.  Eyes: Conjunctivae and EOM are normal. Pupils are equal, round, and reactive  to light.  Neck: Normal range of motion. Neck supple.  Cardiovascular: Normal rate, regular rhythm and normal heart sounds.   Pulmonary/Chest: Effort normal and breath sounds normal. No respiratory distress. She has no wheezes. She has no rales.  Abdominal: Soft. Bowel sounds are normal. She exhibits no distension. There is no tenderness. There is no rebound.  Musculoskeletal: Normal range of motion. She exhibits no edema or tenderness.  Neurological: She is alert and oriented to person, place, and time. No cranial nerve deficit. Coordination normal.  CN 2-12 intact. Nl strength and sensation throughout. Nl finger to nose. Nl gait   Skin: Skin is warm and dry.  Psychiatric: She has a normal mood and affect. Her behavior is  normal. Judgment and thought content normal.  Nursing note and vitals reviewed.   ED Course  Procedures (including critical care time) Labs Review Labs Reviewed  BASIC METABOLIC PANEL - Abnormal; Notable for the following:    Glucose, Bld 101 (*)    All other components within normal limits  CBC WITH DIFFERENTIAL/PLATELET  I-STAT BETA HCG BLOOD, ED ([facility], [facility], [facility] ONLY)    Imaging Review No results found. I have personally reviewed and evaluated these images and lab results as part of my medical decision-making.   EKG Interpretation None      MDM   Final diagnoses:  None   Kristen Ball is a 25 y.o. female here with headaches. Hx of migraines but worse than usual. Has normal neuro exam currently so will not need CT head. Will check labs, HCG, will give migraine cocktail.  10:47 PM Felt better now. Will refer to neuro for outpatient follow up for frequent headaches. May benefit from some preventive medicines.      Wandra Arthurs, MD 05/15/16 2248

## 2016-05-20 ENCOUNTER — Ambulatory Visit (INDEPENDENT_AMBULATORY_CARE_PROVIDER_SITE_OTHER): Payer: BLUE CROSS/BLUE SHIELD | Admitting: Neurology

## 2016-05-20 ENCOUNTER — Encounter: Payer: Self-pay | Admitting: Neurology

## 2016-05-20 VITALS — BP 112/68 | HR 74 | Ht 64.0 in | Wt 108.5 lb

## 2016-05-20 DIAGNOSIS — G43019 Migraine without aura, intractable, without status migrainosus: Secondary | ICD-10-CM | POA: Insufficient documentation

## 2016-05-20 HISTORY — DX: Migraine without aura, intractable, without status migrainosus: G43.019

## 2016-05-20 MED ORDER — RIZATRIPTAN BENZOATE 10 MG PO TBDP: 10.0000 mg | ORAL_TABLET | Freq: Three times a day (TID) | ORAL | Status: DC | PRN

## 2016-05-20 MED ORDER — TOPIRAMATE 25 MG PO TABS: ORAL_TABLET | ORAL | Status: DC

## 2016-05-20 NOTE — Progress Notes (Signed)
Reason for visit: Migraine headache  Referring physician: [address]  Kristen Ball is a 25 y.o. female  History of present illness:  Kristen Ball is a 25 year old right-handed black female with a history of headaches dating back 4 or 5 years. Initially, her headaches have been only very occasional, but over the last 2 or 3 months the headaches become much more frequent. She is having 2 or 3 headaches a week, the headaches may last several hours, and generally occur on the left side the head. They begin in the left frontotemporal area and increase in severity rapidly over 15 minutes. The patient may have pain behind the left eye, no significant visual changes are noted. The patient may have nausea and vomiting and photophobia with the headache. She denies any neck stiffness, or confusion with the headache. She does not know of any particular activating factors for the headache. There is no family history of migraine. She denies any numbness or weakness of the face, arms, or legs. She denies any gait disturbance or difficulty controlling the bowels or the bladder. The patient was seen in the emergency room recently, and she was referred to this office. A head scan has not been done recently.  Past Medical History  Diagnosis Date  . Migraines   . Asthma   . Chicken pox   . Frequent headaches   . Depression   . Common migraine with intractable migraine 05/20/2016    Past Surgical History  Procedure Laterality Date  . Dilation and curettage of uterus  2011  . Dilation and curettage of uterus      Family History  Problem Relation Age of Onset  . Diabetes Paternal Grandmother   . Hypertension Paternal Grandfather   . Arthritis Mother   . Breast cancer Maternal Aunt   . Colon cancer Maternal Grandmother   . Migraines Neg Hx     Social history:  reports that she has been smoking Cigars.  She does not have any smokeless tobacco history on file. She reports that she drinks alcohol. She  reports that she does not use illicit drugs.  Medications:  Prior to Admission medications   Medication Sig Start Date End Date Taking? Authorizing Provider  etonogestrel (NEXPLANON) 68 MG IMPL implant 1 each by Subdermal route once.   Yes Historical Provider, MD  sertraline (ZOLOFT) 50 MG tablet Take 1/2 tab daily x 2 weeks, then increase to a 1 tab daily. Patient taking differently: Take 25 mg by mouth daily. Take 1/2 tab daily x 2 weeks, then increase to a 1 tab daily. 04/13/16  Yes Jearld Fenton, NP  SUMAtriptan (IMITREX) 25 MG tablet Take 1 tablet (25 mg total) by mouth every 2 (two) hours as needed for migraine. May repeat in 2 hours if headache persists or recurs. 05/07/16  Yes Jearld Fenton, NP  albuterol (PROVENTIL HFA;VENTOLIN HFA) 108 (90 BASE) MCG/ACT inhaler Inhale 2 puffs into the lungs every 6 (six) hours as needed for wheezing or shortness of breath. Patient not taking: Reported on 05/20/2016 01/02/14   Lucretia Kern, DO     No Known Allergies  ROS:  Out of a complete 14 system review of symptoms, the patient complains only of the following symptoms, and all other reviewed systems are negative.  Shortness of breath Constipation Moles Allergies Headache Depression  Blood pressure 112/68, pulse 74, height 5\' 4"  (1.626 m), weight 108 lb 8 oz (49.215 kg), last menstrual period 03/02/2016.  Physical Exam  General: The patient is alert and cooperative at the time of the examination.  Eyes: Pupils are equal, round, and reactive to light. Discs are flat bilaterally.  Neck: The neck is supple, no carotid bruits are noted.  Respiratory: The respiratory examination is clear.  Cardiovascular: The cardiovascular examination reveals a regular rate and rhythm, no obvious murmurs or rubs are noted.  Neuromuscular: Range of movement of the cervical spine is full. No crepitus is noted in the temporomandibular joints.  Skin: Extremities are without significant edema.  Neurologic  Exam  Mental status: The patient is alert and oriented x 3 at the time of the examination. The patient has apparent normal recent and remote memory, with an apparently normal attention span and concentration ability.  Cranial nerves: Facial symmetry is present. There is good sensation of the face to pinprick and soft touch bilaterally. The strength of the facial muscles and the muscles to head turning and shoulder shrug are normal bilaterally. Speech is well enunciated, no aphasia or dysarthria is noted. Extraocular movements are full. Visual fields are full. The tongue is midline, and the patient has symmetric elevation of the soft palate. No obvious hearing deficits are noted.  Motor: The motor testing reveals 5 over 5 strength of all 4 extremities. Good symmetric motor tone is noted throughout.  Sensory: Sensory testing is intact to pinprick, soft touch, vibration sensation, and position sense on all 4 extremities. No evidence of extinction is noted.  Coordination: Cerebellar testing reveals good finger-nose-finger and heel-to-shin bilaterally.  Gait and station: Gait is normal. Tandem gait is normal. Romberg is negative. No drift is seen.  Reflexes: Deep tendon reflexes are symmetric and normal bilaterally. Toes are downgoing bilaterally.   Assessment/Plan:  1. Common migraine  The patient has had a significant worsening of her headache frequency recently. The patient will be placed on Topamax working up to 50 mg daily. The patient will be given a prescription for Maxalt. The patient was on Imitrex without any benefit, but she was only on 25 mg. She will follow-up in 3 months, sooner if needed.  Jill Alexanders MD 05/20/2016 7:26 PM  [facility] [address] [place], [place] [address]  Phone [phone] Fax [phone]

## 2016-05-20 NOTE — Patient Instructions (Signed)
Topamax (topiramate) is a seizure medication that has an FDA approval for seizures and for migraine headache. Potential side effects of this medication include weight loss, cognitive slowing, tingling in the fingers and toes, and carbonated drinks will taste bad. If any significant side effects are noted on this drug, please contact our office.  Migraine Headache A migraine headache is an intense, throbbing pain on one or both sides of your head. A migraine can last for 30 minutes to several hours. CAUSES  The exact cause of a migraine headache is not always known. However, a migraine may be caused when nerves in the brain become irritated and release chemicals that cause inflammation. This causes pain. Certain things may also trigger migraines, such as:  Alcohol.  Smoking.  Stress.  Menstruation.  Aged cheeses.  Foods or drinks that contain nitrates, glutamate, aspartame, or tyramine.  Lack of sleep.  Chocolate.  Caffeine.  Hunger.  Physical exertion.  Fatigue.  Medicines used to treat chest pain (nitroglycerine), birth control pills, estrogen, and some blood pressure medicines. SIGNS AND SYMPTOMS  Pain on one or both sides of your head.  Pulsating or throbbing pain.  Severe pain that prevents daily activities.  Pain that is aggravated by any physical activity.  Nausea, vomiting, or both.  Dizziness.  Pain with exposure to bright lights, loud noises, or activity.  General sensitivity to bright lights, loud noises, or smells. Before you get a migraine, you may get warning signs that a migraine is coming (aura). An aura may include:  Seeing flashing lights.  Seeing bright spots, halos, or zigzag lines.  Having tunnel vision or blurred vision.  Having feelings of numbness or tingling.  Having trouble talking.  Having muscle weakness. DIAGNOSIS  A migraine headache is often diagnosed based on:  Symptoms.  Physical exam.  A CT scan or MRI of your  head. These imaging tests cannot diagnose migraines, but they can help rule out other causes of headaches. TREATMENT Medicines may be given for pain and nausea. Medicines can also be given to help prevent recurrent migraines.  HOME CARE INSTRUCTIONS  Only take over-the-counter or prescription medicines for pain or discomfort as directed by your health care provider. The use of long-term narcotics is not recommended.  Lie down in a dark, quiet room when you have a migraine.  Keep a journal to find out what may trigger your migraine headaches. For example, write down:  What you eat and drink.  How much sleep you get.  Any change to your diet or medicines.  Limit alcohol consumption.  Quit smoking if you smoke.  Get 7-9 hours of sleep, or as recommended by your health care provider.  Limit stress.  Keep lights dim if bright lights bother you and make your migraines worse. SEEK IMMEDIATE MEDICAL CARE IF:   Your migraine becomes severe.  You have a fever.  You have a stiff neck.  You have vision loss.  You have muscular weakness or loss of muscle control.  You start losing your balance or have trouble walking.  You feel faint or pass out.  You have severe symptoms that are different from your first symptoms. MAKE SURE YOU:   Understand these instructions.  Will watch your condition.  Will get help right away if you are not doing well or get worse.   This information is not intended to replace advice given to you by your health care provider. Make sure you discuss any questions you have with your  health care provider.   Document Released: 10/26/2005 Document Revised: 11/16/2014 Document Reviewed: 07/03/2013 Elsevier Interactive Patient Education [employer].

## 2016-06-05 ENCOUNTER — Encounter: Payer: Self-pay | Admitting: Internal Medicine

## 2016-06-19 ENCOUNTER — Encounter: Payer: Self-pay | Admitting: Internal Medicine

## 2016-08-07 ENCOUNTER — Other Ambulatory Visit: Payer: Self-pay

## 2016-08-24 ENCOUNTER — Ambulatory Visit: Payer: BLUE CROSS/BLUE SHIELD | Admitting: Adult Health

## 2016-09-10 ENCOUNTER — Ambulatory Visit (INDEPENDENT_AMBULATORY_CARE_PROVIDER_SITE_OTHER): Payer: BLUE CROSS/BLUE SHIELD | Admitting: Family Medicine

## 2016-09-10 ENCOUNTER — Encounter: Payer: Self-pay | Admitting: Internal Medicine

## 2016-09-10 ENCOUNTER — Encounter: Payer: Self-pay | Admitting: Family Medicine

## 2016-09-10 VITALS — BP 110/84 | HR 82 | Temp 98.7°F | Wt 106.2 lb

## 2016-09-10 DIAGNOSIS — R112 Nausea with vomiting, unspecified: Secondary | ICD-10-CM | POA: Diagnosis not present

## 2016-09-10 MED ORDER — ONDANSETRON HCL 4 MG PO TABS: 4.0000 mg | ORAL_TABLET | Freq: Three times a day (TID) | ORAL | 0 refills | Status: DC | PRN

## 2016-09-10 NOTE — Progress Notes (Signed)
Pre visit review using our clinic review tool, if applicable. No additional management support is needed unless otherwise documented below in the visit note. 

## 2016-09-10 NOTE — Progress Notes (Signed)
Subjective:    Patient ID: Kristen Ball, female    DOB: Dec 14, 1990, 25 y.o.   MRN: [id number]  HPI  Ms. Hayenga is a 25 year old who presents today with nausea with one episode of vomiting for 6 days.  Associated loose stools twice daily.  She denies fever, chills, sweats, frank watery stools or blood in her stools. No recent sick contact exposure. She reports being able to eat and drink and has eaten pizza and toast. No suspicious food or recent travel. She reports that symptoms have improved and no treatments have been tried at home.  Recent hydrocodone use x 1 that was provided by her dentist prior to symptoms started.  She denies nausea after this medication.    Denies pregnancy.  Nexplanon placed in 06/2016.    Review of Systems  Constitutional: Negative for chills, fatigue and fever.  HENT: Negative for congestion, postnasal drip, rhinorrhea and sinus pressure.   Respiratory: Negative for cough and wheezing.   Cardiovascular: Negative for chest pain and palpitations.  Gastrointestinal: Positive for diarrhea, nausea and vomiting. Negative for abdominal pain.  Genitourinary: Negative for dysuria, frequency, hematuria and urgency.  Musculoskeletal: Negative for myalgias.  Skin: Negative for rash.  Neurological: Negative for dizziness, light-headedness and headaches.   Past Medical History:  Diagnosis Date  . Asthma   . Chicken pox   . 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 Depression   . Frequent headaches   . Migraines      Social History   Social History  . Marital status: Single    Spouse name: N/A  . Number of children: 0  . Years of education: Some coll   Occupational History  . Post office    Social History Main Topics  . Smoking status: Current Every Day Smoker    Types: Cigars  . Smokeless tobacco: Not on file     Comment: 1 cigar daily  . Alcohol use 0.0 oz/week     Comment: moderate  . Drug use: No  . Sexual activity: Yes    Birth  control/ protection: Implant   Other Topics Concern  . Not on file   Social History Narrative   ** Merged History Encounter **       Work or School: works at post [employer] Situation: lives alone       Spiritual Beliefs: none      Lifestyle: no regular exercising; diet is poor      Right-handed   Rare caffeine use, occasional soda             Past Surgical History:  Procedure Laterality Date  . DILATION AND CURETTAGE OF UTERUS  2011  . DILATION AND CURETTAGE OF UTERUS      Family History  Problem Relation Age of Onset  . Diabetes Paternal Grandmother   . Hypertension Paternal Grandfather   . Arthritis Mother   . Breast cancer Maternal Aunt   . Colon cancer Maternal Grandmother   . Migraines Neg Hx     No Known Allergies  Current Outpatient Prescriptions on File Prior to Visit  Medication Sig Dispense Refill  . albuterol (PROVENTIL HFA;VENTOLIN HFA) 108 (90 BASE) MCG/ACT inhaler Inhale 2 puffs into the lungs every 6 (six) hours as needed for wheezing or shortness of breath. (Patient not taking: Reported on 05/20/2016) 1 Inhaler 0  . etonogestrel (NEXPLANON) 68 MG IMPL implant 1 each by Subdermal route once.    Marland Kitchen  rizatriptan (MAXALT-MLT) 10 MG disintegrating tablet Take 1 tablet (10 mg total) by mouth 3 (three) times daily as needed for migraine. 9 tablet 3  . sertraline (ZOLOFT) 50 MG tablet Take 1/2 tab daily x 2 weeks, then increase to a 1 tab daily. (Patient taking differently: Take 25 mg by mouth daily. Take 1/2 tab daily x 2 weeks, then increase to a 1 tab daily.) 30 tablet 2  . topiramate (TOPAMAX) 25 MG tablet Take one tablet at night for one week, then take 2 tablets at night 60 tablet 3   No current facility-administered medications on file prior to visit.     BP 110/84 (BP Location: Left Arm, Patient Position: Sitting, Cuff Size: Normal)   Pulse 82   Temp 98.7 F (37.1 C) (Oral)   Wt 106 lb 3.2 oz (48.2 kg)   LMP 06/16/2016 (Approximate)   SpO2  98%   BMI 18.23 kg/m        Objective:   Physical Exam  Constitutional: She is oriented to person, place, and time. She appears well-developed and well-nourished.  Eyes: Pupils are equal, round, and reactive to light. No scleral icterus.  Neck: Neck supple.  Cardiovascular: Normal rate, regular rhythm and intact distal pulses.   Pulmonary/Chest: Effort normal and breath sounds normal. She has no wheezes. She has no rales.  Abdominal: Soft. Bowel sounds are normal. She exhibits no distension. There is no tenderness. There is no rebound, no guarding, no CVA tenderness and negative Murphy's sign.  Lymphadenopathy:    She has no cervical adenopathy.  Neurological: She is alert and oriented to person, place, and time. Coordination normal.  Skin: Skin is warm and dry. No rash noted.  Psychiatric: She has a normal mood and affect. Her behavior is normal. Judgment and thought content normal.       Assessment & Plan:  1. Nausea and vomiting, intractability of vomiting not specified, unspecified vomiting type Suspect that symptoms are viral in nature; Symptoms are resolving per patient report. Advised her to proceed with a clear liquid to bland diet as I suspect that eating pizza during her symptoms exacerbated her nausea. Zofran provided. Advised her to follow up with her PCP in 3 to 4 days if symptoms do not improve, worsen, she develops a fever, or new symptoms present. If symptoms continue, we discussed blood work but decided against this today as she is improving. - ondansetron (ZOFRAN) 4 MG tablet; Take 1 tablet (4 mg total) by mouth every 8 (eight) hours as needed for nausea or vomiting.  Dispense: 20 tablet; Refill: 0  Delano Metz, FNP-C

## 2016-09-10 NOTE — Patient Instructions (Signed)
Please take zofran as directed for nausea and eat foods such as toast, crackers, broth based soup, and drink plenty of water to keep your urine pale yellow or clear. Follow up in 3 to 4 days with your provider if symptoms do not improve, worsen, or you develop a fever>100.  Nausea and Vomiting Nausea is a sick feeling that often comes before throwing up (vomiting). Vomiting is a reflex where stomach contents come out of your mouth. Vomiting can cause severe loss of body fluids (dehydration). Children and elderly adults can become dehydrated quickly, especially if they also have diarrhea. Nausea and vomiting are symptoms of a condition or disease. It is important to find the cause of your symptoms. CAUSES   Direct irritation of the stomach lining. This irritation can result from increased acid production (gastroesophageal reflux disease), infection, food poisoning, taking certain medicines (such as nonsteroidal anti-inflammatory drugs), alcohol use, or tobacco use.  Signals from the brain.These signals could be caused by a headache, heat exposure, an inner ear disturbance, increased pressure in the brain from injury, infection, a tumor, or a concussion, pain, emotional stimulus, or metabolic problems.  An obstruction in the gastrointestinal tract (bowel obstruction).  Illnesses such as diabetes, hepatitis, gallbladder problems, appendicitis, kidney problems, cancer, sepsis, atypical symptoms of a heart attack, or eating disorders.  Medical treatments such as chemotherapy and radiation.  Receiving medicine that makes you sleep (general anesthetic) during surgery. DIAGNOSIS Your caregiver may ask for tests to be done if the problems do not improve after a few days. Tests may also be done if symptoms are severe or if the reason for the nausea and vomiting is not clear. Tests may include:  Urine tests.  Blood tests.  Stool tests.  Cultures (to look for evidence of infection).  X-rays or other  imaging studies. Test results can help your caregiver make decisions about treatment or the need for additional tests. TREATMENT You need to stay well hydrated. Drink frequently but in small amounts.You may wish to drink water, sports drinks, clear broth, or eat frozen ice pops or gelatin dessert to help stay hydrated.When you eat, eating slowly may help prevent nausea.There are also some antinausea medicines that may help prevent nausea. HOME CARE INSTRUCTIONS   Take all medicine as directed by your caregiver.  If you do not have an appetite, do not force yourself to eat. However, you must continue to drink fluids.  If you have an appetite, eat a normal diet unless your caregiver tells you differently.  Eat a variety of complex carbohydrates (rice, wheat, potatoes, bread), lean meats, yogurt, fruits, and vegetables.  Avoid high-fat foods because they are more difficult to digest.  Drink enough water and fluids to keep your urine clear or pale yellow.  If you are dehydrated, ask your caregiver for specific rehydration instructions. Signs of dehydration may include:  Severe thirst.  Dry lips and mouth.  Dizziness.  Dark urine.  Decreasing urine frequency and amount.  Confusion.  Rapid breathing or pulse. SEEK IMMEDIATE MEDICAL CARE IF:   You have blood or brown flecks (like coffee grounds) in your vomit.  You have black or bloody stools.  You have a severe headache or stiff neck.  You are confused.  You have severe abdominal pain.  You have chest pain or trouble breathing.  You do not urinate at least once every 8 hours.  You develop cold or clammy skin.  You continue to vomit for longer than 24 to 48 hours.  You have a fever. MAKE SURE YOU:   Understand these instructions.  Will watch your condition.  Will get help right away if you are not doing well or get worse.   This information is not intended to replace advice given to you by your health care  provider. Make sure you discuss any questions you have with your health care provider.   Document Released: 10/26/2005 Document Revised: 01/18/2012 Document Reviewed: 03/25/2011 Elsevier Interactive Patient Education [employer].

## 2016-09-28 ENCOUNTER — Encounter: Payer: Self-pay | Admitting: Internal Medicine

## 2016-10-08 ENCOUNTER — Ambulatory Visit: Payer: Self-pay | Admitting: Internal Medicine

## 2016-10-13 ENCOUNTER — Ambulatory Visit (INDEPENDENT_AMBULATORY_CARE_PROVIDER_SITE_OTHER): Payer: BLUE CROSS/BLUE SHIELD | Admitting: Internal Medicine

## 2016-10-13 ENCOUNTER — Encounter: Payer: Self-pay | Admitting: Internal Medicine

## 2016-10-13 VITALS — BP 108/76 | HR 60 | Temp 99.0°F | Wt 104.5 lb

## 2016-10-13 DIAGNOSIS — M79641 Pain in right hand: Secondary | ICD-10-CM

## 2016-10-13 DIAGNOSIS — M7989 Other specified soft tissue disorders: Secondary | ICD-10-CM

## 2016-10-13 DIAGNOSIS — J301 Allergic rhinitis due to pollen: Secondary | ICD-10-CM

## 2016-10-13 DIAGNOSIS — R609 Edema, unspecified: Secondary | ICD-10-CM | POA: Diagnosis not present

## 2016-10-13 DIAGNOSIS — R8290 Unspecified abnormal findings in urine: Secondary | ICD-10-CM | POA: Diagnosis not present

## 2016-10-13 LAB — POC URINALSYSI DIPSTICK (AUTOMATED)
Bilirubin, UA: NEGATIVE
Glucose, UA: NEGATIVE
KETONES UA: NEGATIVE
LEUKOCYTES UA: NEGATIVE
NITRITE UA: NEGATIVE
PH UA: 6
Spec Grav, UA: >=1.03
UROBILINOGEN UA: NEGATIVE

## 2016-10-13 MED ORDER — HYDROCHLOROTHIAZIDE 12.5 MG PO CAPS: 12.5000 mg | ORAL_CAPSULE | Freq: Every day | ORAL | 0 refills | Status: DC | PRN

## 2016-10-13 MED ORDER — MELOXICAM 7.5 MG PO TABS: 7.5000 mg | ORAL_TABLET | Freq: Every day | ORAL | 2 refills | Status: DC

## 2016-10-13 NOTE — Addendum Note (Signed)
Addended by: Lurlean Nanny on: 10/13/2016 02:55 PM   Modules accepted: Orders

## 2016-10-13 NOTE — Progress Notes (Signed)
Subjective:    Patient ID: Kristen Ball, female    DOB: 20-Jan-1991, 25 y.o.   MRN: [id number]  HPI  Pt presents to the clinic today with c/o intermittent swelling in her hands, ankles and feet. The swelling is in her right hand. She reports on 09/28/16, her feet and ankles were swollen to the point that they were painful. She did not notice any redness or warmth. She denies changes in diet or medication. She was in school, and reports she was sitting for long periods of time. She does not consume a lot of salt that she is aware of. She has not tried anything OTC, but did try elevation without any relief.   She also reports runny nose, sore throat and cough. This started 3 days ago. She is blowing clear mucous out of her nose. She denies difficulty swallowing. The cough is nonproductive. She denies fever, chills or body aches, but has been fatigued. She has not tried anything OTC for this. She has a history of asthma but no allergies. She has not had sick contacts that she is aware of.  She also reports urine odor. She noticed this about 10 days ago. She denies urgency, frequency, dysuria or blood in her urine. She denies vaginal discharge or bleeding. She denies abdominal or pelvic pain. She reports the urine odor has improved. She has not tried anything OTC for this.   Review of Systems      Past Medical History:  Diagnosis Date  . Asthma   . Chicken pox   . 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 Depression   . Frequent headaches   . Migraines     Current Outpatient Prescriptions  Medication Sig Dispense Refill  . albuterol (PROVENTIL HFA;VENTOLIN HFA) 108 (90 BASE) MCG/ACT inhaler Inhale 2 puffs into the lungs every 6 (six) hours as needed for wheezing or shortness of breath. (Patient not taking: Reported on 05/20/2016) 1 Inhaler 0  . etonogestrel (NEXPLANON) 68 MG IMPL implant 1 each by Subdermal route once.    . ondansetron (ZOFRAN) 4 MG tablet Take 1 tablet (4  mg total) by mouth every 8 (eight) hours as needed for nausea or vomiting. 20 tablet 0  . rizatriptan (MAXALT-MLT) 10 MG disintegrating tablet Take 1 tablet (10 mg total) by mouth 3 (three) times daily as needed for migraine. 9 tablet 3  . sertraline (ZOLOFT) 50 MG tablet Take 1/2 tab daily x 2 weeks, then increase to a 1 tab daily. (Patient taking differently: Take 25 mg by mouth daily. Take 1/2 tab daily x 2 weeks, then increase to a 1 tab daily.) 30 tablet 2  . topiramate (TOPAMAX) 25 MG tablet Take one tablet at night for one week, then take 2 tablets at night 60 tablet 3   No current facility-administered medications for this visit.     No Known Allergies  Family History  Problem Relation Age of Onset  . Diabetes Paternal Grandmother   . Hypertension Paternal Grandfather   . Arthritis Mother   . Breast cancer Maternal Aunt   . Colon cancer Maternal Grandmother   . Migraines Neg Hx     Social History   Social History  . Marital status: Single    Spouse name: N/A  . Number of children: 0  . Years of education: Some coll   Occupational History  . Post office    Social History Main Topics  . Smoking status: Current Every Day Smoker  Types: Cigars  . Smokeless tobacco: Not on file     Comment: 1 cigar daily  . Alcohol use 0.0 oz/week     Comment: moderate  . Drug use: No  . Sexual activity: Yes    Birth control/ protection: Implant   Other Topics Concern  . Not on file   Social History Narrative   ** Merged History Encounter **       Work or School: works at post [employer] Situation: lives alone       Spiritual Beliefs: none      Lifestyle: no regular exercising; diet is poor      Right-handed   Rare caffeine use, occasional soda              Constitutional: Denies fever, malaise, fatigue, headache or abrupt weight changes.  HEENT: Pt reports runny nose, sore throat. Denies eye pain, eye redness, ear pain, ringing in the ears, wax buildup,  nasal congestion, bloody nose. Respiratory: Pt reports cough. Denies difficulty breathing, shortness of breath or sputum production.   Cardiovascular: Pt reports swelling in her right hand and bilateral ankles and feet. Denies chest pain, chest tightness, palpitations.  Gastrointestinal: Denies abdominal pain, bloating, constipation, diarrhea or blood in the stool.  GU: Pt reports urine odor. Denies urgency, frequency, pain with urination, burning sensation, blood in urine, or discharge. Musculoskeletal: Pt reports right hand pain. Denies decrease in range of motion, difficulty with gait, muscle pain.  Skin: Denies redness, rashes, lesions or ulcercations.    No other specific complaints in a complete review of systems (except as listed in HPI above).  Objective:   Physical Exam  BP 108/76   Pulse 60   Temp 99 F (37.2 C) (Oral)   Wt 104 lb 8 oz (47.4 kg)   SpO2 100%   BMI 17.94 kg/m  Wt Readings from Last 3 Encounters:  10/13/16 104 lb 8 oz (47.4 kg)  09/10/16 106 lb 3.2 oz (48.2 kg)  05/20/16 108 lb 8 oz (49.2 kg)    General: Appears her stated age, well developed, well nourished in NAD. Skin: Warm, dry and intact. No redness or warmth noted of BLE. HEENT: Head: normal shape and size, no sinus tenderness noted; Eyes: sclera white, no icterus, conjunctiva pink; Ears: Tm's gray and intact, normal light reflex; Nose: mucosa boggy and moist, septum midline; Throat/Mouth: Teeth present, mucosa pink and moist, no exudate, lesions or ulcerations noted.  Neck:  No adenopathy noted.  Cardiovascular: Normal rate and rhythm. S1,S2 noted.  No murmur, rubs or gallops noted. No BLE edema noted today. Pulmonary/Chest: Normal effort and positive vesicular breath sounds. No respiratory distress. No wheezes, rales or ronchi noted.  Abdomen: Soft and nontender. Normal bowel sounds. No distention or masses noted. Musculoskeletal: Normal flexion and extension of the right wrist and fingers. Mild  joint enlargement noted of the right PIP, middle finger. Hand grips equal.   BMET    Component Value Date/Time   NA 136 05/15/2016 2046   K 3.6 05/15/2016 2046   CL 103 05/15/2016 2046   CO2 26 05/15/2016 2046   GLUCOSE 101 (H) 05/15/2016 2046   BUN 8 05/15/2016 2046   CREATININE 0.57 05/15/2016 2046   CALCIUM 9.8 05/15/2016 2046   GFRNONAA >60 05/15/2016 2046   GFRAA >60 05/15/2016 2046    Lipid Panel  No results found for: CHOL, TRIG, HDL, CHOLHDL, VLDL, LDLCALC  CBC    Component Value Date/Time  WBC 8.1 05/15/2016 2046   RBC 4.53 05/15/2016 2046   HGB 13.2 05/15/2016 2046   HCT 40.8 05/15/2016 2046   PLT 241 05/15/2016 2046   MCV 90.1 05/15/2016 2046   MCH 29.1 05/15/2016 2046   MCHC 32.4 05/15/2016 2046   RDW 12.2 05/15/2016 2046   LYMPHSABS 3.0 05/15/2016 2046   MONOABS 0.5 05/15/2016 2046   EOSABS 0.2 05/15/2016 2046   BASOSABS 0.0 05/15/2016 2046    Hgb A1C No results found for: HGBA1C     Assessment & Plan:   Allergic Rhinitis:  Try Zyrtec OTC daily x 1 week  Peripheral Edema:  Intermittent Avoid sitting for long periods of time Discussed low salt diet BMET reviewed- kidney function normal eRx for HCTZ to take daily prn (discussed not taking this daily)  Right wrist pain and swelling:  No abnormality noted Xray of right hand from 2015 reviewed- normal Start Meloxicam 7.5 mg daily Let me know if this does not improve  RTC as needed or if symptoms persist or worsen BAITY, REGINA, NP

## 2016-10-13 NOTE — Patient Instructions (Signed)
Edema  Edema is an abnormal buildup of fluids. It is more common in your legs and thighs. Painless swelling of the feet and ankles is more likely as a person ages. It also is common in looser skin, like around your eyes.  Follow these instructions at home:  ? Keep the affected body part above the level of the heart while lying down.  ? Do not sit still or stand for a long time.  ? Do not put anything right under your knees when you lie down.  ? Do not wear tight clothes on your upper legs.  ? Exercise your legs to help the puffiness (swelling) go down.  ? Wear elastic bandages or support stockings as told by your doctor.  ? A low-salt diet may help lessen the puffiness.  ? Only take medicine as told by your doctor.  Contact a doctor if:  ? Treatment is not working.  ? You have heart, liver, or kidney disease and notice that your skin looks puffy or shiny.  ? You have puffiness in your legs that does not get better when you raise your legs.  ? You have sudden weight gain for no reason.  Get help right away if:  ? You have shortness of breath or chest pain.  ? You cannot breathe when you lie down.  ? You have pain, redness, or warmth in the areas that are puffy.  ? You have heart, liver, or kidney disease and get edema all of a sudden.  ? You have a fever and your symptoms get worse all of a sudden.  This information is not intended to replace advice given to you by your health care provider. Make sure you discuss any questions you have with your health care provider.  Document Released: 04/13/2008 Document Revised: 04/02/2016 Document Reviewed: 08/18/2013  Elsevier Interactive Patient Education ? 2017 [employer] Inc[employer]

## 2016-10-14 LAB — URINE CULTURE

## 2016-10-22 ENCOUNTER — Encounter: Payer: Self-pay | Admitting: Internal Medicine

## 2016-10-23 ENCOUNTER — Encounter: Payer: Self-pay | Admitting: Family Medicine

## 2016-10-23 ENCOUNTER — Ambulatory Visit (INDEPENDENT_AMBULATORY_CARE_PROVIDER_SITE_OTHER): Payer: BLUE CROSS/BLUE SHIELD | Admitting: Family Medicine

## 2016-10-23 ENCOUNTER — Encounter: Payer: Self-pay | Admitting: *Deleted

## 2016-10-23 VITALS — BP 116/70 | HR 72 | Temp 99.0°F | Wt 106.8 lb

## 2016-10-23 DIAGNOSIS — B309 Viral conjunctivitis, unspecified: Secondary | ICD-10-CM | POA: Diagnosis not present

## 2016-10-23 MED ORDER — POLYMYXIN B-TRIMETHOPRIM 10000-0.1 UNIT/ML-% OP SOLN: 1.0000 [drp] | OPHTHALMIC | 0 refills | Status: DC

## 2016-10-23 NOTE — Patient Instructions (Signed)
You have pink eye, likely viral. Treat with cool compresses, lubricating eye drops like OTC rephresh or hypotears. If no better, fill antibiotic eye drops.  As it is contagious, out of work for next 2 nights. Let us know if not improving with treatment or any worsening  Viral Conjunctivitis, Adult Viral conjunctivitis is an inflammation of the clear membrane that covers the white part of your eye and the inner surface of your eyelid (conjunctiva). The inflammation is caused by a viral infection. The blood vessels in the conjunctiva become inflamed, causing the eye to become red or pink, and often itchy. Viral conjunctivitis can be easily passed from one person to another (is contagious). This condition is often called pink eye. What are the causes? This condition is caused by a virus. A virus is a type of contagious germ. It can be spread by touching objects that have been contaminated with the virus, such as doorknobs or towels. It can also be passed through droplets, such as from coughing or sneezing. What are the signs or symptoms? Symptoms of this condition include:  Eye redness.  Tearing or watery eyes.  Itchy and irritated eyes.  Burning feeling in the eyes.  Clear drainage from the eye.  Swollen eyelids.  A gritty feeling in the eye.  Light sensitivity. This condition often occurs with other symptoms, such as a fever, nausea, or a rash. How is this diagnosed? This condition is diagnosed with a medical history and physical exam. If you have discharge from your eye, the discharge may be tested to rule out other causes of conjunctivitis. How is this treated? Viral conjunctivitis does not respond to medicines that kill bacteria (antibiotics). Treatment for viral conjunctivitis is directed at stopping a bacterial infection from developing in addition to the viral infection. Treatment also aims to relieve your symptoms, such as itching. This may be done with antihistamine drops or  other eye medicines. Rarely, steroid eye drops or antiviral medicines may be prescribed. Follow these instructions at home: Medicines  Take or apply over-the-counter and prescription medicines only as told by your health care provider.  Be very careful to avoid touching the edge of the eyelid with the eye drop bottle or ointment tube when applying medicines to the affected eye. Being careful this way will stop you from spreading the infection to the other eye or to other people. Eye care  Avoid touching or rubbing your eyes.  Apply a warm, wet, clean washcloth to your eye for 10-20 minutes, 3-4 times per day or as told by your health care provider.  If you wear contact lenses, do not wear them until the inflammation is gone and your health care provider says it is safe to wear them again. Ask your health care provider how to sterilize or replace your contact lenses before using them again. Wear glasses until you can resume wearing contacts.  Avoid wearing eye makeup until the inflammation is gone. Throw away any old eye cosmetics that may be contaminated.  Gently wipe away any drainage from your eye with a warm, wet washcloth or a cotton ball. General instructions  Change or wash your pillowcase every day or as told by your health care provider.  Do not share towels, pillowcases, washcloths, eye makeup, makeup brushes, contact lenses, or glasses. This may spread the infection.  Wash your hands often with soap and water. Use paper towels to dry your hands. If soap and water are not available, use hand sanitizer.  Try to avoid  contact with other people for one week or as told by your health care provider. Contact a health care provider if:  Your symptoms do not improve with treatment or they get worse.  You have increased pain.  Your vision becomes blurry.  You have a fever.  You have facial pain, redness, or swelling.  You have yellow or green drainage coming from your  eye.  You have new symptoms. This information is not intended to replace advice given to you by your health care provider. Make sure you discuss any questions you have with your health care provider. Document Released: 01/16/2003 Document Revised: 05/23/2016 Document Reviewed: 05/12/2016 Elsevier Interactive Patient Education  2017 [employer].

## 2016-10-23 NOTE — Progress Notes (Signed)
BP 116/70   Pulse 72   Temp 99 F (37.2 C) (Oral)   Wt 106 lb 12 oz (48.4 kg)   LMP 10/20/2016   BMI 18.32 kg/m    CC: conjunctivitis Subjective:    Patient ID: Kristen Ball, female    DOB: 04-07-1991, 25 y.o.   MRN: [id number]  HPI: [name] is a 25 y.o. female presenting on 10/23/2016 for Conjunctivitis   Woke up last night with red eyes that were bothersome. Monday L eye was bothering her but this cleared up. Noticing sensitivity to light as well as itching and burning. + congestion, coughing, mucous.   No fevers/chills, ear or tooth pain, rhinorrhea. No pain with eye movement.   Relevant past medical, surgical, family and social history reviewed and updated as indicated. Interim medical history since our last visit reviewed. Allergies and medications reviewed and updated. Current Outpatient Prescriptions on File Prior to Visit  Medication Sig  . albuterol (PROVENTIL HFA;VENTOLIN HFA) 108 (90 BASE) MCG/ACT inhaler Inhale 2 puffs into the lungs every 6 (six) hours as needed for wheezing or shortness of breath.  . etonogestrel (NEXPLANON) 68 MG IMPL implant 1 each by Subdermal route once.  . hydrochlorothiazide (MICROZIDE) 12.5 MG capsule Take 1 capsule (12.5 mg total) by mouth daily as needed.  . meloxicam (MOBIC) 7.5 MG tablet Take 1 tablet (7.5 mg total) by mouth daily.  . rizatriptan (MAXALT-MLT) 10 MG disintegrating tablet Take 1 tablet (10 mg total) by mouth 3 (three) times daily as needed for migraine.  . sertraline (ZOLOFT) 50 MG tablet Take 1/2 tab daily x 2 weeks, then increase to a 1 tab daily. (Patient taking differently: Take 25 mg by mouth daily. Take 1/2 tab daily x 2 weeks, then increase to a 1 tab daily.)  . ondansetron (ZOFRAN) 4 MG tablet Take 1 tablet (4 mg total) by mouth every 8 (eight) hours as needed for nausea or vomiting. (Patient not taking: Reported on 10/23/2016)  . topiramate (TOPAMAX) 25 MG tablet Take one tablet at night for one week, then  take 2 tablets at night (Patient not taking: Reported on 10/23/2016)   No current facility-administered medications on file prior to visit.     Review of Systems Per HPI unless specifically indicated in ROS section     Objective:    BP 116/70   Pulse 72   Temp 99 F (37.2 C) (Oral)   Wt 106 lb 12 oz (48.4 kg)   LMP 10/20/2016   BMI 18.32 kg/m   Wt Readings from Last 3 Encounters:  10/23/16 106 lb 12 oz (48.4 kg)  10/13/16 104 lb 8 oz (47.4 kg)  09/10/16 106 lb 3.2 oz (48.2 kg)    Physical Exam  Constitutional: She appears well-developed and well-nourished. No distress.  HENT:  Head: Normocephalic and atraumatic.  Right Ear: Hearing, tympanic membrane, external ear and ear canal normal.  Left Ear: Hearing, tympanic membrane, external ear and ear canal normal.  Nose: No mucosal edema or rhinorrhea. Right sinus exhibits no maxillary sinus tenderness and no frontal sinus tenderness. Left sinus exhibits no maxillary sinus tenderness and no frontal sinus tenderness.  Mouth/Throat: Uvula is midline, oropharynx is clear and moist and mucous membranes are normal. No oropharyngeal exudate, posterior oropharyngeal edema, posterior oropharyngeal erythema or tonsillar abscesses.  Eyes: EOM are normal. Pupils are equal, round, and reactive to light. Right eye exhibits no discharge. Left eye exhibits no discharge. Right conjunctiva is injected. Left conjunctiva is injected. No scleral  icterus.  Bulbar conjunctival injection with limbic sparing EOMI without pain   Neck: Normal range of motion. Neck supple.  Cardiovascular: Normal rate, regular rhythm, normal heart sounds and intact distal pulses.   No murmur heard. Pulmonary/Chest: Effort normal and breath sounds normal. No respiratory distress. She has no wheezes. She has no rales.  Lymphadenopathy:    She has no cervical adenopathy.  Skin: Skin is warm and dry. No rash noted.  Nursing note and vitals reviewed.     Assessment & Plan:    Problem List Items Addressed This Visit    Viral conjunctivitis of both eyes - Primary    Anticipate viral given short duration and recent URI sxs. Discussed this, discussed contagious nature of illness, will rec out of work x 2 nights. Discussed universal precautions with frequent hand washing. Treat with cool compresses, lubricating eye drops, if no better will fill abx eye drop - Rx printed today. Pt agrees with plan.          Follow up plan: Return if symptoms worsen or fail to improve.  Ria Bush, MD

## 2016-10-23 NOTE — Assessment & Plan Note (Signed)
Anticipate viral given short duration and recent URI sxs. Discussed this, discussed contagious nature of illness, will rec out of work x 2 nights. Discussed universal precautions with frequent hand washing. Treat with cool compresses, lubricating eye drops, if no better will fill abx eye drop - Rx printed today. Pt agrees with plan.

## 2016-10-23 NOTE — Progress Notes (Signed)
Pre visit review using our clinic review tool, if applicable. No additional management support is needed unless otherwise documented below in the visit note. 

## 2016-10-25 IMAGING — US US BREAST LTD UNI LEFT INC AXILLA
1 series · 4 of 4 positions shown · non-contrast
Comparison: Previous exam(s).

CLINICAL DATA: The patient returns for six-month follow-up of a
palpable left breast mass 530 location. She states the mass has
increased in size.

EXAM:
ULTRASOUND OF THE left BREAST

[Series 1: us breast ltd uni left inc axilla · 0.06mm/px · 4 of 4 slices shown]
[im 1/4]
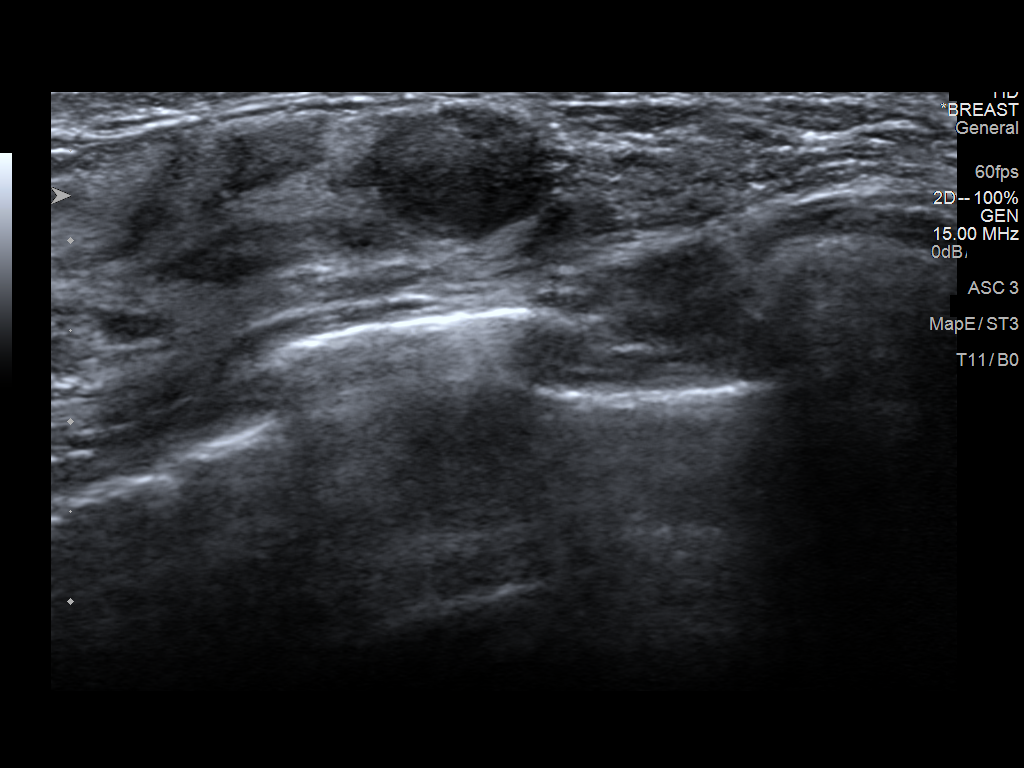
[im 2/4]
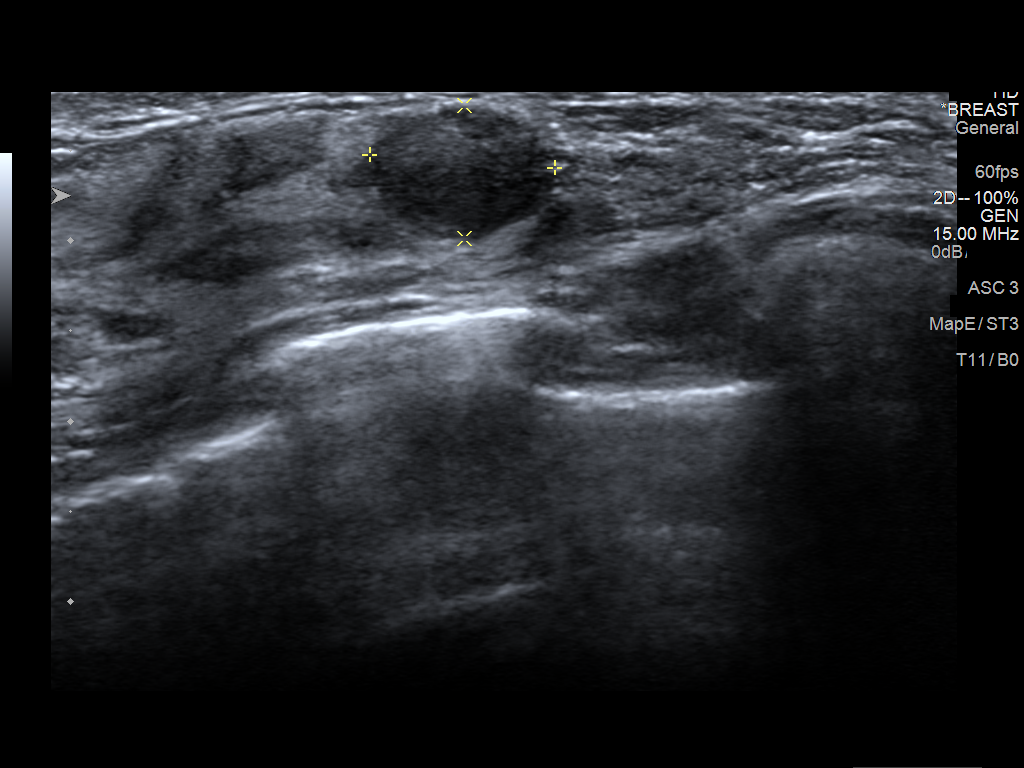
[im 3/4]
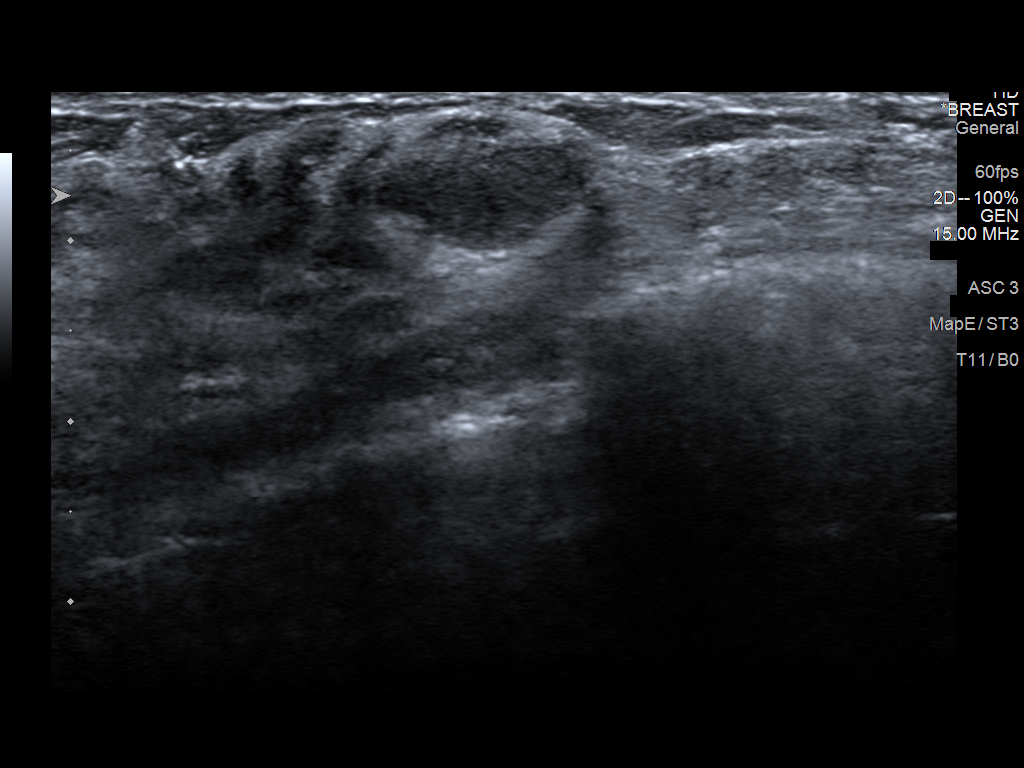
[im 4/4]
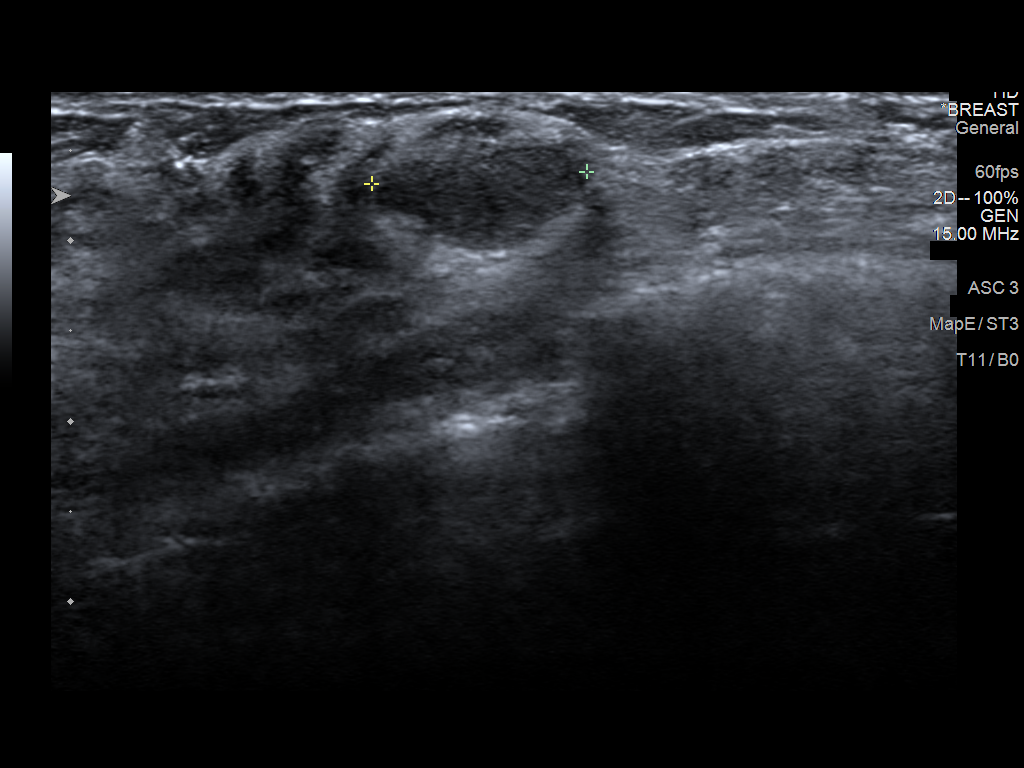

[4 of 4 positions shown; findings below may reference images not displayed]

FINDINGS: Targeted ultrasound is performed, showing an oval hypoechoic left
breast mass 530 location 8 cm from the nipple measuring 1.2 x 0.7 x
1.0 cm, previously 1.1 x 0.8 x 0.6 cm.
IMPRESSION: Minimal increase in size of probable fibroadenoma left breast 530
location, however biopsy is recommended due to perceived and
quantitative increase in size and will be scheduled at the patient's
convenience.

RECOMMENDATION:
Left ultrasound-guided core biopsy

I have discussed the findings and recommendations with the patient.
Results were also provided in writing at the conclusion of the
visit. If applicable, a reminder letter will be sent to the patient
regarding the next appointment.

BI-RADS CATEGORY  4: Suspicious.

## 2016-10-28 ENCOUNTER — Encounter: Payer: Self-pay | Admitting: Family Medicine

## 2016-10-29 NOTE — Telephone Encounter (Addendum)
plz provide patient with letter excuse from work for 12/20 and 21 due to ongoing conjunctivitis. If ongoing trouble, will need re eval in office.

## 2016-10-30 NOTE — Telephone Encounter (Signed)
Patient called and asked the letter to be put on my chart letters category.  Patient said she could see the first letter Dr.G. Did under letters.  Patient said since she won't be able to pick up the letter today she would be able to show it to work tonight. She missed worked on Wednesday and Thursday night.

## 2016-11-03 NOTE — Telephone Encounter (Signed)
Letter written and placed up front for pick up. Patient notified.

## 2016-11-10 ENCOUNTER — Other Ambulatory Visit: Payer: Self-pay | Admitting: Internal Medicine

## 2016-11-16 MED ORDER — HYDROCHLOROTHIAZIDE 12.5 MG PO CAPS: 12.5000 mg | ORAL_CAPSULE | Freq: Every day | ORAL | 0 refills | Status: DC | PRN

## 2016-11-16 NOTE — Telephone Encounter (Signed)
PLEASE NOTE: All timestamps contained within this report are represented as [place] Standard Time. CONFIDENTIALTY NOTICE: This fax transmission is intended only for the addressee. It contains information that is legally privileged, confidential or otherwise protected from use or disclosure. If you are not the intended recipient, you are strictly prohibited from reviewing, disclosing, copying using or disseminating any of this information or taking any action in reliance on or regarding this information. If you have received this fax in error, please notify us immediately by telephone so that we can arrange for its return to [place]. Phone: [phone], Toll-Free: [phone], Fax: [phone] Page: 1 of 2 Call Id: [id number] [place] Patient Name: Kristen Ball Gender: Female DOB: [birth year] Age: 26 Y 3 M 21 D Return Phone Number: [phone] (Primary) Address: City/State/Zip: [place] Client [facility] - Client Client Site [facility] Physician [name] - NP Contact Type Call Who Is Calling Patient / Member / Family / Caregiver Call Type Triage / Clinical Relationship To Patient Self Return Phone Number [phone] (Primary) Chief Complaint Feet swelling Reason for Call Symptomatic / Request for Health Information Initial Comment She got a prescription for swelling and she has misplaced the medication. She is having swelling in her feet and hands. Translation No Nurse Assessment Nurse: Adrian Blackwater, RN, Claiborne Billings Date/Time (Eastern Time): 11/14/2016 10:11:15 AM Confirm and document reason for call. If symptomatic, describe symptoms. ---Caller states she was given medicine to take for swelling as needed and she has misplaced it. Does the patient have any new or worsening symptoms? ---No Nurse: Adrian Blackwater, RN, Claiborne Billings Date/Time Eilene Ghazi Time):  11/14/2016 10:12:45 AM Please select the assessment type ---Refill Additional Documentation ---Caller has swelling in her feet and hands. Was Rxd HCTZ on 12/5 and has misplaced her Rx. Upon further questioning, she said the pharmacy says she has a refill but it needs to be approved. Does the patient have enough medication to last until the office opens? ---No Additional Documentation ---Pharmacist states that med was denied refill by her PCP. Patient advised to follow up when office opens. Guidelines Guideline Title Affirmed Question Affirmed Notes Nurse Date/Time (Eastern Time) Disp. Time Eilene Ghazi Time) Disposition Final User 11/14/2016 10:28:55 AM Pharmacy Call Adrian Blackwater, RN, Claiborne Billings Reason: Pharmacist at [facility] states that [name] NP has denied refilling the med with no other explanation given. 11/14/2016 10:31:46 AM Call Completed Adrian Blackwater, RN, Claiborne Billings PLEASE NOTE: All timestamps contained within this report are represented as [place] Standard Time. CONFIDENTIALTY NOTICE: This fax transmission is intended only for the addressee. It contains information that is legally privileged, confidential or otherwise protected from use or disclosure. If you are not the intended recipient, you are strictly prohibited from reviewing, disclosing, copying using or disseminating any of this information or taking any action in reliance on or regarding this information. If you have received this fax in error, please notify us immediately by telephone so that we can arrange for its return to [place]. Phone: [phone], Toll-Free: [phone], Fax: [phone] Page: 2 of 2 Call Id: [id number] 11/14/2016 10:31:39 AM Clinical Call Yes Adrian Blackwater, RN, Claiborne Billings Comments User: Delana Meyer, RN Date/Time Eilene Ghazi Time): 11/14/2016 10:31:26 AM Patient informed that refill denied by her provider Per pharmacist. She is advised to make appointment to clarify and/or ask for medication when office opens.

## 2016-11-16 NOTE — Telephone Encounter (Signed)
Per email note Threasa Beards CMA has already refilled per email to pt.

## 2016-12-07 ENCOUNTER — Encounter: Payer: Self-pay | Admitting: Internal Medicine

## 2016-12-07 ENCOUNTER — Ambulatory Visit (INDEPENDENT_AMBULATORY_CARE_PROVIDER_SITE_OTHER): Payer: No Typology Code available for payment source | Admitting: Internal Medicine

## 2016-12-07 VITALS — BP 108/76 | Temp 98.3°F | Wt 109.0 lb

## 2016-12-07 DIAGNOSIS — F329 Major depressive disorder, single episode, unspecified: Secondary | ICD-10-CM

## 2016-12-07 DIAGNOSIS — M79641 Pain in right hand: Secondary | ICD-10-CM | POA: Diagnosis not present

## 2016-12-07 DIAGNOSIS — M7989 Other specified soft tissue disorders: Secondary | ICD-10-CM | POA: Diagnosis not present

## 2016-12-07 DIAGNOSIS — F419 Anxiety disorder, unspecified: Secondary | ICD-10-CM

## 2016-12-07 DIAGNOSIS — F418 Other specified anxiety disorders: Secondary | ICD-10-CM | POA: Diagnosis not present

## 2016-12-07 MED ORDER — SERTRALINE HCL 100 MG PO TABS: 150.0000 mg | ORAL_TABLET | Freq: Every day | ORAL | 2 refills | Status: DC

## 2016-12-07 NOTE — Patient Instructions (Signed)
Hand Pain Introduction Many things can cause hand pain. Some common causes are:  An injury.  Repeating the same movement with your hand over and over (overuse).  Osteoporosis.  Arthritis.  Lumps in the tendons or joints of the hand and wrist (ganglion cysts).  Infection. Follow these instructions at home: Pay attention to any changes in your symptoms. Take these actions to help with your discomfort:  If directed, put ice on the affected area:  Put ice in a plastic bag.  Place a towel between your skin and the bag.  Leave the ice on for 15-20 minutes, 3?4 times a day for 2 days.  Take over-the-counter and prescription medicines only as told by your health care provider.  Minimize stress on your hands and wrists as much as possible.  Take breaks from repetitive activity often.  Do stretches as told by your health care provider.  Do not do activities that make your pain worse. Contact a health care provider if:  Your pain does not get better after a few days of self-care.  Your pain gets worse.  Your pain affects your ability to do your daily activities. Get help right away if:  Your hand becomes warm, red, or swollen.  Your hand is numb or tingling.  Your hand is extremely swollen or deformed.  Your hand or fingers turn white or blue.  You cannot move your hand, wrist, or fingers. This information is not intended to replace advice given to you by your health care provider. Make sure you discuss any questions you have with your health care provider. Document Released: 11/22/2015 Document Revised: 04/02/2016 Document Reviewed: 11/21/2014  2017 Elsevier

## 2016-12-07 NOTE — Assessment & Plan Note (Signed)
Deteriorated Support offered today Will increase Zoloft to 150 mg daily She declines referral to therapy  Let me know if 4 weeks via mychart how you are doing

## 2016-12-07 NOTE — Progress Notes (Signed)
Subjective:    Patient ID: Kristen Ball, female    DOB: Jun 24, 1991, 26 y.o.   MRN: [id number]  HPI  Pt presents to the clinic today to follow up right hand pain. She has been having right hand pain for 2+ years. She does have intermittent swelling. She reports she has noticed at work that when she uses the metal broom, it causes more pain and swelling in her right hand. She has also tried using a wood broom, which she reports seems to have no effect on her hand pain. She is requesting a note today, that states she can use a wood broom instead of a metal broom. She had an xray in 2015, which showed no acute abnormality. She was seen 10/13/16 for the same and started on Meloxciam. She reports the Meloxicam helps while she is at home, but not when she is at work.  She reports she is no longer taking Zoloft for anxiety and depression. She felt like it did not work. Her anxiety and depression are triggered by stress at work and general life stress. She doesn't want to get out of bed in the morning. She cries often. She denies SI/HI.   Review of Systems  Past Medical History:  Diagnosis Date  . Asthma   . Chicken pox   . 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 Depression   . Frequent headaches   . Migraines     Current Outpatient Prescriptions  Medication Sig Dispense Refill  . albuterol (PROVENTIL HFA;VENTOLIN HFA) 108 (90 BASE) MCG/ACT inhaler Inhale 2 puffs into the lungs every 6 (six) hours as needed for wheezing or shortness of breath. 1 Inhaler 0  . etonogestrel (NEXPLANON) 68 MG IMPL implant 1 each by Subdermal route once.    . hydrochlorothiazide (MICROZIDE) 12.5 MG capsule Take 1 capsule (12.5 mg total) by mouth daily as needed. 30 capsule 0  . meloxicam (MOBIC) 7.5 MG tablet Take 1 tablet (7.5 mg total) by mouth daily. 30 tablet 2  . ondansetron (ZOFRAN) 4 MG tablet Take 1 tablet (4 mg total) by mouth every 8 (eight) hours as needed for nausea or vomiting. (Patient  not taking: Reported on 10/23/2016) 20 tablet 0  . rizatriptan (MAXALT-MLT) 10 MG disintegrating tablet Take 1 tablet (10 mg total) by mouth 3 (three) times daily as needed for migraine. 9 tablet 3  . sertraline (ZOLOFT) 50 MG tablet Take 1/2 tab daily x 2 weeks, then increase to a 1 tab daily. (Patient taking differently: Take 25 mg by mouth daily. Take 1/2 tab daily x 2 weeks, then increase to a 1 tab daily.) 30 tablet 2  . topiramate (TOPAMAX) 25 MG tablet Take one tablet at night for one week, then take 2 tablets at night (Patient not taking: Reported on 10/23/2016) 60 tablet 3   No current facility-administered medications for this visit.     No Known Allergies  Family History  Problem Relation Age of Onset  . Arthritis Mother   . Breast cancer Maternal Aunt   . Diabetes Paternal Grandmother   . Hypertension Paternal Grandfather   . Colon cancer Maternal Grandmother   . Migraines Neg Hx     Social History   Social History  . Marital status: Single    Spouse name: N/A  . Number of children: 0  . Years of education: Some coll   Occupational History  . Post office    Social History Main Topics  . Smoking status: Current Every  Day Smoker    Types: Cigars  . Smokeless tobacco: Never Used     Comment: 1 cigar daily  . Alcohol use 0.0 oz/week     Comment: moderate  . Drug use: No  . Sexual activity: Yes    Birth control/ protection: Implant   Other Topics Concern  . Not on file   Social History Narrative   ** Merged History Encounter **       Work or School: works at post [employer] Situation: lives alone       Spiritual Beliefs: none      Lifestyle: no regular exercising; diet is poor      Right-handed   Rare caffeine use, occasional soda              Constitutional: Denies fever, malaise, fatigue, headache or abrupt weight changes.  Musculoskeletal: Pt reports right hand pain and swelling. Denies decrease in range of motion, difficulty with gait,  muscle pain.  Psych: Pt reports anxiety and depression. Denies SI/HI.  No other specific complaints in a complete review of systems (except as listed in HPI above).     Objective:   Physical Exam   BP 108/76   Temp 98.3 F (36.8 C) (Oral)   Wt 109 lb (49.4 kg)   SpO2 (!) 72%   BMI 18.71 kg/m  Wt Readings from Last 3 Encounters:  12/07/16 109 lb (49.4 kg)  10/23/16 106 lb 12 oz (48.4 kg)  10/13/16 104 lb 8 oz (47.4 kg)    General: Appears her stated age, well developed, well nourished in NAD. Musculoskeletal: Normal flexion and extension of the fingers and wrist. No signs of joint swelling.No pain with palpation of the right hand. Grip strength equal bilaterally.  Neurological: Alert and oriented.  Psychiatric: Mood and affect normal. Behavior is normal. Judgment and thought content normal.     BMET    Component Value Date/Time   NA 136 05/15/2016 2046   K 3.6 05/15/2016 2046   CL 103 05/15/2016 2046   CO2 26 05/15/2016 2046   GLUCOSE 101 (H) 05/15/2016 2046   BUN 8 05/15/2016 2046   CREATININE 0.57 05/15/2016 2046   CALCIUM 9.8 05/15/2016 2046   GFRNONAA >60 05/15/2016 2046   GFRAA >60 05/15/2016 2046    Lipid Panel  No results found for: CHOL, TRIG, HDL, CHOLHDL, VLDL, LDLCALC  CBC    Component Value Date/Time   WBC 8.1 05/15/2016 2046   RBC 4.53 05/15/2016 2046   HGB 13.2 05/15/2016 2046   HCT 40.8 05/15/2016 2046   PLT 241 05/15/2016 2046   MCV 90.1 05/15/2016 2046   MCH 29.1 05/15/2016 2046   MCHC 32.4 05/15/2016 2046   RDW 12.2 05/15/2016 2046   LYMPHSABS 3.0 05/15/2016 2046   MONOABS 0.5 05/15/2016 2046   EOSABS 0.2 05/15/2016 2046   BASOSABS 0.0 05/15/2016 2046    Hgb A1C No results found for: HGBA1C         Assessment & Plan:   Chronic right hand pain and swelling:  She does not want a repeat xray today She wants to continue Meloxicam at current dose Work note provided  RTC as needed or if symptoms persist or worsen [name], NP

## 2017-02-25 ENCOUNTER — Encounter: Payer: Self-pay | Admitting: Internal Medicine

## 2017-03-02 ENCOUNTER — Ambulatory Visit (INDEPENDENT_AMBULATORY_CARE_PROVIDER_SITE_OTHER)
Admission: RE | Admit: 2017-03-02 | Discharge: 2017-03-02 | Disposition: A | Discharge status: Home / Self Care | Payer: No Typology Code available for payment source | Source: Ambulatory Visit | Attending: Internal Medicine | Admitting: Internal Medicine

## 2017-03-02 ENCOUNTER — Encounter: Payer: Self-pay | Admitting: Internal Medicine

## 2017-03-02 ENCOUNTER — Ambulatory Visit (INDEPENDENT_AMBULATORY_CARE_PROVIDER_SITE_OTHER): Payer: No Typology Code available for payment source | Admitting: Internal Medicine

## 2017-03-02 VITALS — BP 104/68 | HR 69 | Temp 98.2°F | Wt 113.5 lb

## 2017-03-02 DIAGNOSIS — M7989 Other specified soft tissue disorders: Secondary | ICD-10-CM

## 2017-03-02 DIAGNOSIS — M79641 Pain in right hand: Secondary | ICD-10-CM

## 2017-03-02 LAB — SEDIMENTATION RATE: SED RATE: 5 mm/h (ref 0–20)

## 2017-03-02 LAB — C-REACTIVE PROTEIN: CRP: <0.1 mg/dL — ABNORMAL LOW (ref 0.5–20.0)

## 2017-03-02 NOTE — Progress Notes (Signed)
Subjective:    Patient ID: Kristen Ball, female    DOB: 26-May-1991, 26 y.o.   MRN: [id number]  HPI  Pt presents to the clinic today to follow up right hand swelling and pain. This has been going on for at least 3 years. She has swelling and pain every day. She describes the pain as aching. She denies numbness and tingling in her right hand. The pain is aggravated by her employment she works as a Retail buyer. She does report an injury about 8 years ago. She reports she punched a headboard. She did not have her hand evaluated at that time. She has had an xray of her right hand in 2015 which was normal. She has been prescribed Meloxicam and HCTZ, which she is taking with some relief. She has no family history of autoimmune disorders. She denies any other joint pains at this time.  Review of Systems      Past Medical History:  Diagnosis Date  . Asthma   . Chicken pox   . 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 Depression   . Frequent headaches   . Migraines     Current Outpatient Prescriptions  Medication Sig Dispense Refill  . albuterol (PROVENTIL HFA;VENTOLIN HFA) 108 (90 BASE) MCG/ACT inhaler Inhale 2 puffs into the lungs every 6 (six) hours as needed for wheezing or shortness of breath. 1 Inhaler 0  . etonogestrel (NEXPLANON) 68 MG IMPL implant 1 each by Subdermal route once.    . hydrochlorothiazide (MICROZIDE) 12.5 MG capsule Take 1 capsule (12.5 mg total) by mouth daily as needed. 30 capsule 0  . meloxicam (MOBIC) 7.5 MG tablet Take 1 tablet (7.5 mg total) by mouth daily. 30 tablet 2  . ondansetron (ZOFRAN) 4 MG tablet Take 1 tablet (4 mg total) by mouth every 8 (eight) hours as needed for nausea or vomiting. 20 tablet 0  . rizatriptan (MAXALT-MLT) 10 MG disintegrating tablet Take 1 tablet (10 mg total) by mouth 3 (three) times daily as needed for migraine. 9 tablet 3  . sertraline (ZOLOFT) 100 MG tablet Take 1.5 tablets (150 mg total) by mouth daily. 45 tablet 2  .  topiramate (TOPAMAX) 25 MG tablet Take one tablet at night for one week, then take 2 tablets at night 60 tablet 3   No current facility-administered medications for this visit.     No Known Allergies  Family History  Problem Relation Age of Onset  . Arthritis Mother   . Breast cancer Maternal Aunt   . Diabetes Paternal Grandmother   . Hypertension Paternal Grandfather   . Colon cancer Maternal Grandmother   . Migraines Neg Hx     Social History   Social History  . Marital status: Single    Spouse name: N/A  . Number of children: 0  . Years of education: Some coll   Occupational History  . Post office    Social History Main Topics  . Smoking status: Current Every Day Smoker    Types: Cigars  . Smokeless tobacco: Never Used     Comment: 1 cigar daily  . Alcohol use 0.0 oz/week     Comment: moderate  . Drug use: No  . Sexual activity: Yes    Birth control/ protection: Implant   Other Topics Concern  . Not on file   Social History Narrative   ** Merged History Encounter **       Work or School: works at post office  Home Situation: lives alone       Spiritual Beliefs: none      Lifestyle: no regular exercising; diet is poor      Right-handed   Rare caffeine use, occasional soda              Constitutional: Denies fever, malaise, fatigue, headache or abrupt weight changes.  Musculoskeletal: Pt reports right hand pain and swelling. Denies decrease in range of motion, difficulty with gait, muscle pain.   No other specific complaints in a complete review of systems (except as listed in HPI above).  Objective:   Physical Exam   BP 104/68   Pulse 69   Temp 98.2 F (36.8 C) (Oral)   Wt 113 lb 8 oz (51.5 kg)   SpO2 98%   BMI 19.48 kg/m  Wt Readings from Last 3 Encounters:  03/02/17 113 lb 8 oz (51.5 kg)  12/07/16 109 lb (49.4 kg)  10/23/16 106 lb 12 oz (48.4 kg)    General: Appears her stated age, in NAD. Musculoskeletal: Normal flexion and  extension of the right wrist and fingers. Enlargement noted of the PIP, third finger. No joint swelling noted. Hand grips equal. Neurological: Alert and oriented. Negative Phalen's and Tinel's.   BMET    Component Value Date/Time   NA 136 05/15/2016 2046   K 3.6 05/15/2016 2046   CL 103 05/15/2016 2046   CO2 26 05/15/2016 2046   GLUCOSE 101 (H) 05/15/2016 2046   BUN 8 05/15/2016 2046   CREATININE 0.57 05/15/2016 2046   CALCIUM 9.8 05/15/2016 2046   GFRNONAA >60 05/15/2016 2046   GFRAA >60 05/15/2016 2046    Lipid Panel  No results found for: CHOL, TRIG, HDL, CHOLHDL, VLDL, LDLCALC  CBC    Component Value Date/Time   WBC 8.1 05/15/2016 2046   RBC 4.53 05/15/2016 2046   HGB 13.2 05/15/2016 2046   HCT 40.8 05/15/2016 2046   PLT 241 05/15/2016 2046   MCV 90.1 05/15/2016 2046   MCH 29.1 05/15/2016 2046   MCHC 32.4 05/15/2016 2046   RDW 12.2 05/15/2016 2046   LYMPHSABS 3.0 05/15/2016 2046   MONOABS 0.5 05/15/2016 2046   EOSABS 0.2 05/15/2016 2046   BASOSABS 0.0 05/15/2016 2046    Hgb A1C No results found for: HGBA1C         Assessment & Plan:   Right Hand Pain and Swelling:  Repeat xray of right hand today Continue Meloxicam Will check ESR, RF, ANA and CRP today May need referall to ortho pending lab and xray results  Will follow up after labs and xray, RTC as needed [name], NP

## 2017-03-02 NOTE — Patient Instructions (Signed)
Hand Pain Many things can cause hand pain. Some common causes are:  An injury.  Repeating the same movement with your hand over and over (overuse).  Osteoporosis.  Arthritis.  Lumps in the tendons or joints of the hand and wrist (ganglion cysts).  Infection. Follow these instructions at home: Pay attention to any changes in your symptoms. Take these actions to help with your discomfort:  If directed, put ice on the affected area:  Put ice in a plastic bag.  Place a towel between your skin and the bag.  Leave the ice on for 15-20 minutes, 3?4 times a day for 2 days.  Take over-the-counter and prescription medicines only as told by your health care provider.  Minimize stress on your hands and wrists as much as possible.  Take breaks from repetitive activity often.  Do stretches as told by your health care provider.  Do not do activities that make your pain worse. Contact a health care provider if:  Your pain does not get better after a few days of self-care.  Your pain gets worse.  Your pain affects your ability to do your daily activities. Get help right away if:  Your hand becomes warm, red, or swollen.  Your hand is numb or tingling.  Your hand is extremely swollen or deformed.  Your hand or fingers turn white or blue.  You cannot move your hand, wrist, or fingers. This information is not intended to replace advice given to you by your health care provider. Make sure you discuss any questions you have with your health care provider. Document Released: 11/22/2015 Document Revised: 04/02/2016 Document Reviewed: 11/21/2014 Elsevier Interactive Patient Education  2017 [employer].

## 2017-03-03 LAB — RHEUMATOID FACTOR

## 2017-03-03 LAB — ANA: Anti Nuclear Antibody(ANA): NEGATIVE

## 2017-03-04 ENCOUNTER — Other Ambulatory Visit: Payer: Self-pay | Admitting: Internal Medicine

## 2017-03-04 DIAGNOSIS — M79641 Pain in right hand: Secondary | ICD-10-CM

## 2017-03-26 ENCOUNTER — Encounter: Payer: Self-pay | Admitting: Internal Medicine

## 2017-04-01 ENCOUNTER — Encounter: Payer: Self-pay | Admitting: Internal Medicine

## 2017-04-01 ENCOUNTER — Ambulatory Visit (INDEPENDENT_AMBULATORY_CARE_PROVIDER_SITE_OTHER)
Admission: RE | Admit: 2017-04-01 | Discharge: 2017-04-01 | Disposition: A | Discharge status: Home / Self Care | Payer: No Typology Code available for payment source | Source: Ambulatory Visit | Attending: Internal Medicine | Admitting: Internal Medicine

## 2017-04-01 ENCOUNTER — Ambulatory Visit (INDEPENDENT_AMBULATORY_CARE_PROVIDER_SITE_OTHER): Payer: No Typology Code available for payment source | Admitting: Internal Medicine

## 2017-04-01 VITALS — BP 102/68 | HR 72 | Temp 98.6°F | Wt 105.5 lb

## 2017-04-01 DIAGNOSIS — R0602 Shortness of breath: Secondary | ICD-10-CM

## 2017-04-01 DIAGNOSIS — J4520 Mild intermittent asthma, uncomplicated: Secondary | ICD-10-CM | POA: Diagnosis not present

## 2017-04-01 LAB — CBC
HCT: 39.9 % (ref 36.0–46.0)
HEMOGLOBIN: 13.3 g/dL (ref 12.0–15.0)
MCHC: 33.4 g/dL (ref 30.0–36.0)
MCV: 89 fl (ref 78.0–100.0)
PLATELETS: 208 10*3/uL (ref 150.0–400.0)
RBC: 4.48 Mil/uL (ref 3.87–5.11)
RDW: 13.3 % (ref 11.5–15.5)
WBC: 7.5 10*3/uL (ref 4.0–10.5)

## 2017-04-01 NOTE — Progress Notes (Signed)
Subjective:    Patient ID: Kristen Ball, female    DOB: April 22, 1991, 26 y.o.   MRN: [id number]  HPI  Pt presents to the clinic today with c/o mild shortness of breath. She has noticed this over the last few months. It seems to be more frequent in the last few weeks. She reports it lasts about 30 seconds and then resolves. It occurs at rest and with exertion. She has holding her hands above her head and using her Albuterol inhaler without much relief. She does have a history of mild, intermittent asthma. She does smoke. There is no spirometry on file.  Review of Systems      Past Medical History:  Diagnosis Date  . Asthma   . Chicken pox   . 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 Depression   . Frequent headaches   . Migraines     Current Outpatient Prescriptions  Medication Sig Dispense Refill  . albuterol (PROVENTIL HFA;VENTOLIN HFA) 108 (90 BASE) MCG/ACT inhaler Inhale 2 puffs into the lungs every 6 (six) hours as needed for wheezing or shortness of breath. 1 Inhaler 0  . hydrochlorothiazide (MICROZIDE) 12.5 MG capsule Take 1 capsule (12.5 mg total) by mouth daily as needed. 30 capsule 0  . meloxicam (MOBIC) 7.5 MG tablet Take 1 tablet (7.5 mg total) by mouth daily. 30 tablet 2  . ondansetron (ZOFRAN) 4 MG tablet Take 1 tablet (4 mg total) by mouth every 8 (eight) hours as needed for nausea or vomiting. 20 tablet 0  . rizatriptan (MAXALT-MLT) 10 MG disintegrating tablet Take 1 tablet (10 mg total) by mouth 3 (three) times daily as needed for migraine. 9 tablet 3  . sertraline (ZOLOFT) 100 MG tablet Take 1.5 tablets (150 mg total) by mouth daily. 45 tablet 2  . topiramate (TOPAMAX) 25 MG tablet Take one tablet at night for one week, then take 2 tablets at night 60 tablet 3   No current facility-administered medications for this visit.     No Known Allergies  Family History  Problem Relation Age of Onset  . Arthritis Mother   . Breast cancer Maternal Aunt     . Diabetes Paternal Grandmother   . Hypertension Paternal Grandfather   . Colon cancer Maternal Grandmother   . Migraines Neg Hx     Social History   Social History  . Marital status: Single    Spouse name: N/A  . Number of children: 0  . Years of education: Some coll   Occupational History  . Post office    Social History Main Topics  . Smoking status: Current Every Day Smoker    Types: Cigars  . Smokeless tobacco: Never Used     Comment: 1 cigar daily  . Alcohol use 0.0 oz/week     Comment: moderate  . Drug use: No  . Sexual activity: Yes    Birth control/ protection: Implant   Other Topics Concern  . Not on file   Social History Narrative   ** Merged History Encounter **       Work or School: works at post [employer] Situation: lives alone       Spiritual Beliefs: none      Lifestyle: no regular exercising; diet is poor      Right-handed   Rare caffeine use, occasional soda              Constitutional: Denies fever, malaise, fatigue, headache or abrupt  weight changes.  HEENT: Denies eye pain, eye redness, ear pain, ringing in the ears, wax buildup, runny nose, nasal congestion, bloody nose, or sore throat. Respiratory: Pt reports shortness of breath. Denies difficulty breathing, cough or sputum production.   Cardiovascular: Denies chest pain, chest tightness, palpitations or swelling in the hands or feet.   No other specific complaints in a complete review of systems (except as listed in HPI above).  Objective:   Physical Exam  BP 102/68   Pulse 72   Temp 98.6 F (37 C) (Oral)   Wt 105 lb 8 oz (47.9 kg)   BMI 18.11 kg/m  Wt Readings from Last 3 Encounters:  04/01/17 105 lb 8 oz (47.9 kg)  03/02/17 113 lb 8 oz (51.5 kg)  12/07/16 109 lb (49.4 kg)    General: Appears her stated age, in NAD. Cardiovascular: Normal rate and rhythm. S1,S2 noted.  No murmur, rubs or gallops noted.  Pulmonary/Chest: Normal effort and positive vesicular  breath sounds. No respiratory distress. No wheezes, rales or ronchi noted.    BMET    Component Value Date/Time   NA 136 05/15/2016 2046   K 3.6 05/15/2016 2046   CL 103 05/15/2016 2046   CO2 26 05/15/2016 2046   GLUCOSE 101 (H) 05/15/2016 2046   BUN 8 05/15/2016 2046   CREATININE 0.57 05/15/2016 2046   CALCIUM 9.8 05/15/2016 2046   GFRNONAA >60 05/15/2016 2046   GFRAA >60 05/15/2016 2046    Lipid Panel  No results found for: CHOL, TRIG, HDL, CHOLHDL, VLDL, LDLCALC  CBC    Component Value Date/Time   WBC 8.1 05/15/2016 2046   RBC 4.53 05/15/2016 2046   HGB 13.2 05/15/2016 2046   HCT 40.8 05/15/2016 2046   PLT 241 05/15/2016 2046   MCV 90.1 05/15/2016 2046   MCH 29.1 05/15/2016 2046   MCHC 32.4 05/15/2016 2046   RDW 12.2 05/15/2016 2046   LYMPHSABS 3.0 05/15/2016 2046   MONOABS 0.5 05/15/2016 2046   EOSABS 0.2 05/15/2016 2046   BASOSABS 0.0 05/15/2016 2046    Hgb A1C No results found for: HGBA1C          Assessment & Plan:   Intermittent Shortness of Breath:  Chest xray today Spirometry today normal Will check CBC today Advised her to stop smoking  Will follow up after chest xray, RTC as needed [name], NP

## 2017-04-01 NOTE — Patient Instructions (Signed)
Shortness of Breath, Adult  Shortness of breath means you have trouble breathing. Your lungs are organs for breathing.  Follow these instructions at home:  Pay attention to any changes in your symptoms. Take these actions to help with your condition:  ? Do not smoke. Smoking can cause shortness of breath. If you need help to quit smoking, ask your doctor.  ? Avoid things that can make it harder to breathe, such as:  ? Mold.  ? Dust.  ? Air pollution.  ? Chemical smells.  ? Things that can cause allergy symptoms (allergens), if you have allergies.  ? Keep your living space clean and free of mold and dust.  ? Rest as needed. Slowly return to your usual activities.  ? Take over-the-counter and prescription medicines, including oxygen and inhaled medicines, only as told by your doctor.  ? Keep all follow-up visits as told by your doctor. This is important.  Contact a doctor if:  ? Your condition does not get better as soon as expected.  ? You have a hard time doing your normal activities, even after you rest.  ? You have new symptoms.  Get help right away if:  ? You have trouble breathing when you are resting.  ? You feel light-headed or you faint.  ? You have a cough that is not helped by medicines.  ? You cough up blood.  ? You have pain with breathing.  ? You have pain in your chest, arms, shoulders, or belly (abdomen).  ? You have a fever.  ? You cannot walk up stairs.  ? You cannot exercise the way you normally do.  This information is not intended to replace advice given to you by your health care provider. Make sure you discuss any questions you have with your health care provider.  Document Released: 04/13/2008 Document Revised: 11/12/2016 Document Reviewed: 11/12/2016  Elsevier Interactive Patient Education ? 2017 Elsevier Inc[employer]

## 2017-04-26 ENCOUNTER — Other Ambulatory Visit: Payer: Self-pay | Admitting: Orthopedic Surgery

## 2017-04-26 DIAGNOSIS — R52 Pain, unspecified: Secondary | ICD-10-CM

## 2017-04-27 ENCOUNTER — Other Ambulatory Visit: Payer: Self-pay | Admitting: Nurse Practitioner

## 2017-04-27 DIAGNOSIS — N6310 Unspecified lump in the right breast, unspecified quadrant: Secondary | ICD-10-CM

## 2017-04-27 DIAGNOSIS — N6320 Unspecified lump in the left breast, unspecified quadrant: Secondary | ICD-10-CM

## 2017-04-30 ENCOUNTER — Ambulatory Visit
Admission: RE | Admit: 2017-04-30 | Discharge: 2017-04-30 | Disposition: A | Discharge status: Home / Self Care | Payer: No Typology Code available for payment source | Source: Ambulatory Visit | Attending: Nurse Practitioner | Admitting: Nurse Practitioner

## 2017-04-30 DIAGNOSIS — N6310 Unspecified lump in the right breast, unspecified quadrant: Secondary | ICD-10-CM

## 2017-04-30 DIAGNOSIS — N6320 Unspecified lump in the left breast, unspecified quadrant: Secondary | ICD-10-CM

## 2017-09-02 ENCOUNTER — Encounter: Payer: Self-pay | Admitting: Internal Medicine

## 2017-11-11 ENCOUNTER — Ambulatory Visit: Payer: Self-pay | Admitting: *Deleted

## 2017-11-11 NOTE — Telephone Encounter (Signed)
Patient called because of some difficulty breathing for the last 2 weeks. She has a pain in her back when she takes a deep breath. Sometimes has some shortness of breath even when she is not doing anything. Pt advised to go to UC to be assessed this morning.  Reason for Disposition . [1] MILD difficulty breathing (e.g., minimal/no SOB at rest, SOB with walking, pulse <100) AND [2] NEW-onset or WORSE than normal  Answer Assessment - Initial Assessment Questions 1. RESPIRATORY STATUS: "Describe your breathing?" (e.g., wheezing, shortness of breath, unable to speak, severe coughing)      Short of breath 2. ONSET: "When did this breathing problem begin?"      2 weeks 3. PATTERN "Does the difficult breathing come and go, or has it been constant since it started?"      Constant  4. SEVERITY: "How bad is your breathing?" (e.g., mild, moderate, severe)    - MILD: No SOB at rest, mild SOB with walking, speaks normally in sentences, can lay down, no retractions, pulse < 100.    - MODERATE: SOB at rest, SOB with minimal exertion and prefers to sit, cannot lie down flat, speaks in phrases, mild retractions, audible wheezing, pulse 100-120.    - SEVERE: Very SOB at rest, speaks in single words, struggling to breathe, sitting hunched forward, retractions, pulse > 120      moderate 5. RECURRENT SYMPTOM: "Have you had difficulty breathing before?" If so, ask: "When was the last time?" and "What happened that time?"      no 6. CARDIAC HISTORY: "Do you have any history of heart disease?" (e.g., heart attack, angina, bypass surgery, angioplasty)      no 7. LUNG HISTORY: "Do you have any history of lung disease?"  (e.g., pulmonary embolus, asthma, emphysema)     asthma 8. CAUSE: "What do you think is causing the breathing problem?"      Not sure 9. OTHER SYMPTOMS: "Do you have any other symptoms? (e.g., dizziness, runny nose, cough, chest pain, fever)     Back pain, had cough on Sunday with minor sore  throat 10. PREGNANCY: "Is there any chance you are pregnant?" "When was your last menstrual period?"       No is on [place]. Just had period for 2 weeks straight 11. TRAVEL: "Have you traveled out of the country in the last month?" (e.g., travel history, exposures)       no  Protocols used: BREATHING DIFFICULTY-A-AH

## 2017-11-11 NOTE — Telephone Encounter (Signed)
UC appropriate if no appointments here

## 2017-11-11 NOTE — Telephone Encounter (Signed)
Kristen Hensen RN also noted; . Pt going to UC for c/o shortness of breath at [facility] in [facility] . (Routing comment)

## 2017-11-16 ENCOUNTER — Encounter: Payer: Self-pay | Admitting: Internal Medicine

## 2017-11-16 ENCOUNTER — Ambulatory Visit: Payer: No Typology Code available for payment source | Admitting: Internal Medicine

## 2017-11-16 VITALS — BP 106/78 | HR 60 | Temp 98.3°F | Wt 112.0 lb

## 2017-11-16 DIAGNOSIS — F419 Anxiety disorder, unspecified: Secondary | ICD-10-CM | POA: Diagnosis not present

## 2017-11-16 DIAGNOSIS — R0602 Shortness of breath: Secondary | ICD-10-CM

## 2017-11-16 MED ORDER — SERTRALINE HCL 25 MG PO TABS: 25.0000 mg | ORAL_TABLET | Freq: Every day | ORAL | 2 refills | Status: DC

## 2017-11-16 NOTE — Progress Notes (Signed)
Subjective:    Patient ID: Kristen Ball, female    DOB: 1991-05-30, 27 y.o.   MRN: [id number]  HPI  Pt presents to the clinic today for ER follow up. She went to the ER 1/3 with c/o SOB x 2 weeks. She also c/o a sharp stabbing pain in her back with taking a deep breath. Labs were unremarkable. Chest xray was normal. CTA chest was negative for PE. She was advised that it was anxiety and was discharged and advised to follow up with PCP. Since discharge, she has noticed improvement. She denies shortness of breath or the sharps, stabbing pains in her back. She is a current every day smoker. She does feel anxious. She feels like her job is a major stressor but she reports she can not quit her job. She would like to know ways to better manage her anxiety.  Review of Systems      Past Medical History:  Diagnosis Date  . Asthma   . Chicken pox   . 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 Depression   . Migraines     Current Outpatient Medications  Medication Sig Dispense Refill  . OVER THE COUNTER MEDICATION Take 1 capsule by mouth daily. Tumeric    . topiramate (TOPAMAX) 25 MG tablet Take one tablet at night for one week, then take 2 tablets at night 60 tablet 3  . albuterol (PROVENTIL HFA;VENTOLIN HFA) 108 (90 BASE) MCG/ACT inhaler Inhale 2 puffs into the lungs every 6 (six) hours as needed for wheezing or shortness of breath. (Patient not taking: Reported on 11/16/2017) 1 Inhaler 0   No current facility-administered medications for this visit.     No Known Allergies  Family History  Problem Relation Age of Onset  . Arthritis Mother   . Breast cancer Maternal Aunt   . Diabetes Paternal Grandmother   . Hypertension Paternal Grandfather   . Colon cancer Maternal Grandmother   . Migraines Neg Hx     Social History   Socioeconomic History  . Marital status: Single    Spouse name: Not on file  . Number of children: 0  . Years of education: Some coll  . Highest  education level: Not on file  Social Needs  . Financial resource strain: Not on file  . Food insecurity - worry: Not on file  . Food insecurity - inability: Not on file  . Transportation needs - medical: Not on file  . Transportation needs - non-medical: Not on file  Occupational History  . Occupation: Post office  Tobacco Use  . Smoking status: Current Every Day Smoker    Types: Cigars  . Smokeless tobacco: Never Used  . Tobacco comment: 1 cigar daily  Substance and Sexual Activity  . Alcohol use: Yes    Alcohol/week: 0.0 oz    Comment: moderate  . Drug use: No  . Sexual activity: Yes    Birth control/protection: Implant  Other Topics Concern  . Not on file  Social History Narrative   ** Merged History Encounter **       Work or School: works at post [employer] Situation: lives alone       Spiritual Beliefs: none      Lifestyle: no regular exercising; diet is poor      Right-handed   Rare caffeine use, occasional soda           Constitutional: Denies fever, malaise, fatigue, headache or abrupt weight  changes.  HEENT: Denies eye pain, eye redness, ear pain, ringing in the ears, wax buildup, runny nose, nasal congestion, bloody nose, or sore throat. Respiratory: Denies difficulty breathing, shortness of breath, cough or sputum production.   Cardiovascular: Denies chest pain, chest tightness, palpitations or swelling in the hands or feet.  Musculoskeletal: Denies decrease in range of motion, difficulty with gait, muscle pain or joint pain and swelling.  Neurological: Denies dizziness, difficulty with memory, difficulty with speech or problems with balance and coordination.  Psych: Pt reports anxiety. Denies depression, SI/HI.  No other specific complaints in a complete review of systems (except as listed in HPI above).  Objective:   Physical Exam   BP 106/78   Temp 98.3 F (36.8 C) (Oral)   Wt 112 lb (50.8 kg)   BMI 19.22 kg/m  Wt Readings from Last 3  Encounters:  11/16/17 112 lb (50.8 kg)  04/01/17 105 lb 8 oz (47.9 kg)  03/02/17 113 lb 8 oz (51.5 kg)    General: Appears her stated age, well developed, well nourished in NAD. Cardiovascular: Normal rate and rhythm. S1,S2 noted.  No murmur, rubs or gallops noted.  Pulmonary/Chest: Normal effort and positive vesicular breath sounds. No respiratory distress. No wheezes, rales or ronchi noted.  Psychiatric: Mood and affect normal. Behavior is normal. Judgment and thought content normal.     BMET    Component Value Date/Time   NA 136 05/15/2016 2046   K 3.6 05/15/2016 2046   CL 103 05/15/2016 2046   CO2 26 05/15/2016 2046   GLUCOSE 101 (H) 05/15/2016 2046   BUN 8 05/15/2016 2046   CREATININE 0.57 05/15/2016 2046   CALCIUM 9.8 05/15/2016 2046   GFRNONAA >60 05/15/2016 2046   GFRAA >60 05/15/2016 2046    Lipid Panel  No results found for: CHOL, TRIG, HDL, CHOLHDL, VLDL, LDLCALC  CBC    Component Value Date/Time   WBC 7.5 04/01/2017 1322   RBC 4.48 04/01/2017 1322   HGB 13.3 04/01/2017 1322   HCT 39.9 04/01/2017 1322   PLT 208.0 04/01/2017 1322   MCV 89.0 04/01/2017 1322   MCH 29.1 05/15/2016 2046   MCHC 33.4 04/01/2017 1322   RDW 13.3 04/01/2017 1322   LYMPHSABS 3.0 05/15/2016 2046   MONOABS 0.5 05/15/2016 2046   EOSABS 0.2 05/15/2016 2046   BASOSABS 0.0 05/15/2016 2046    Hgb A1C No results found for: HGBA1C         Assessment & Plan:  ER Follow Up for SOB, Anxiety:  ER notes, labs and imaging reviewed Discussed treatment of anxiety with CBT and medication management She is not interested in therapy at this time eRx for Zoloft 25 mg daily, start by taking 1/2 tab daily x 1 week then increase to a 1 tab daily Support offered today  Follow up with me in 1 month via mychart and let me know how you are doing [name], NP

## 2017-11-16 NOTE — Patient Instructions (Signed)

## 2017-11-17 ENCOUNTER — Telehealth: Payer: Self-pay | Admitting: Internal Medicine

## 2017-11-17 NOTE — Telephone Encounter (Signed)
fmla paperwork in [facility] in box °

## 2017-11-19 NOTE — Telephone Encounter (Signed)
Spoke to pt she is aware paperwork is ready for pick up and that she will need to turn paperwork in Copy for pt Copy for file Copy for scan

## 2017-11-19 NOTE — Telephone Encounter (Signed)
Done, given back to Robin 

## 2017-12-23 ENCOUNTER — Encounter: Payer: Self-pay | Admitting: Internal Medicine

## 2017-12-31 ENCOUNTER — Encounter: Payer: Self-pay | Admitting: Internal Medicine

## 2018-01-07 ENCOUNTER — Telehealth: Payer: Self-pay | Admitting: Internal Medicine

## 2018-01-07 NOTE — Telephone Encounter (Signed)
Left message asking pt to call office.   I tried calling the number [phone] to ask about pt fmla (corrections she needed) I was on hold for 105min  Need a different number or pt needs to call to see what exactly needs to be corrected

## 2018-01-10 NOTE — Telephone Encounter (Signed)
Left message letting pt know paperwork has been correct and she will need to turn in

## 2018-01-10 NOTE — Telephone Encounter (Signed)
Spoke with pt she will get another number for me or she will call and find out exactly what needs to change and call me back

## 2018-01-10 NOTE — Telephone Encounter (Signed)
Kristen Ball Call back [phone]  FMLA states page 1 needs corrected. There are 2 conditions listed under the section of what she was last seen for. They told her it can only list the issue of which FMLA is for. Pt states FMLA is for depression. No additional # provided to contact [place] office.

## 2018-01-10 NOTE — Telephone Encounter (Signed)
Copy for scan Copy for pt 

## 2018-01-25 ENCOUNTER — Ambulatory Visit: Payer: Self-pay | Admitting: Internal Medicine

## 2018-02-01 ENCOUNTER — Ambulatory Visit: Payer: No Typology Code available for payment source | Admitting: Internal Medicine

## 2018-02-01 DIAGNOSIS — Z0289 Encounter for other administrative examinations: Secondary | ICD-10-CM

## 2018-02-01 NOTE — Progress Notes (Deleted)
Subjective:    Patient ID: Kristen Ball, female    DOB: 1991-01-10, 27 y.o.   MRN: [id number]  HPI  Pt presents to the clinic today to discuss her migraines. She is taking Topamax as prescribed.  Review of Systems      Past Medical History:  Diagnosis Date  . Asthma   . Chicken pox   . 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 Depression   . Migraines     Current Outpatient Medications  Medication Sig Dispense Refill  . albuterol (PROVENTIL HFA;VENTOLIN HFA) 108 (90 BASE) MCG/ACT inhaler Inhale 2 puffs into the lungs every 6 (six) hours as needed for wheezing or shortness of breath. (Patient not taking: Reported on 11/16/2017) 1 Inhaler 0  . OVER THE COUNTER MEDICATION Take 1 capsule by mouth daily. Tumeric    . sertraline (ZOLOFT) 25 MG tablet Take 1 tablet (25 mg total) by mouth daily. 30 tablet 2  . topiramate (TOPAMAX) 25 MG tablet Take one tablet at night for one week, then take 2 tablets at night 60 tablet 3   No current facility-administered medications for this visit.     No Known Allergies  Family History  Problem Relation Age of Onset  . Arthritis Mother   . Breast cancer Maternal Aunt   . Diabetes Paternal Grandmother   . Hypertension Paternal Grandfather   . Colon cancer Maternal Grandmother   . Migraines Neg Hx     Social History   Socioeconomic History  . Marital status: Single    Spouse name: Not on file  . Number of children: 0  . Years of education: Some coll  . Highest education level: Not on file  Occupational History  . Occupation: Marine scientist  Social Needs  . Financial resource strain: Not on file  . Food insecurity:    Worry: Not on file    Inability: Not on file  . Transportation needs:    Medical: Not on file    Non-medical: Not on file  Tobacco Use  . Smoking status: Current Every Day Smoker    Types: Cigars  . Smokeless tobacco: Never Used  . Tobacco comment: 1 cigar daily  Substance and Sexual Activity  .  Alcohol use: Yes    Alcohol/week: 0.0 oz    Comment: moderate  . Drug use: No  . Sexual activity: Yes    Birth control/protection: Implant  Lifestyle  . Physical activity:    Days per week: Not on file    Minutes per session: Not on file  . Stress: Not on file  Relationships  . Social connections:    Talks on phone: Not on file    Gets together: Not on file    Attends religious service: Not on file    Active member of club or organization: Not on file    Attends meetings of clubs or organizations: Not on file    Relationship status: Not on file  . Intimate partner violence:    Fear of current or ex partner: Not on file    Emotionally abused: Not on file    Physically abused: Not on file    Forced sexual activity: Not on file  Other Topics Concern  . Not on file  Social History Narrative   ** Merged History Encounter **       Work or School: works at post office      Home Situation: lives alone       Spiritual Beliefs:  none      Lifestyle: no regular exercising; diet is poor      Right-handed   Rare caffeine use, occasional soda           Constitutional: Pt reports migraines. Denies fever, malaise, fatigue, or abrupt weight changes.  HEENT: Denies eye pain, eye redness, ear pain, ringing in the ears, wax buildup, runny nose, nasal congestion, bloody nose, or sore throat. Respiratory: Denies difficulty breathing, shortness of breath, cough or sputum production.   Cardiovascular: Denies chest pain, chest tightness, palpitations or swelling in the hands or feet.  Gastrointestinal: Denies abdominal pain, bloating, constipation, diarrhea or blood in the stool.  GU: Denies urgency, frequency, pain with urination, burning sensation, blood in urine, odor or discharge. Musculoskeletal: Denies decrease in range of motion, difficulty with gait, muscle pain or joint pain and swelling.  Skin: Denies redness, rashes, lesions or ulcercations.  Neurological: Denies dizziness,  difficulty with memory, difficulty with speech or problems with balance and coordination.  Psych: Denies anxiety, depression, SI/HI.  No other specific complaints in a complete review of systems (except as listed in HPI above).  Objective:   Physical Exam        Assessment & Plan:

## 2018-02-02 ENCOUNTER — Ambulatory Visit: Payer: No Typology Code available for payment source | Admitting: Internal Medicine

## 2018-02-02 ENCOUNTER — Encounter: Payer: Self-pay | Admitting: Internal Medicine

## 2018-02-02 DIAGNOSIS — G43C0 Periodic headache syndromes in child or adult, not intractable: Secondary | ICD-10-CM | POA: Diagnosis not present

## 2018-02-02 MED ORDER — TOPIRAMATE 50 MG PO TABS: 50.0000 mg | ORAL_TABLET | Freq: Every day | ORAL | 2 refills | Status: DC

## 2018-02-02 MED ORDER — SUMATRIPTAN SUCCINATE 25 MG PO TABS: 25.0000 mg | ORAL_TABLET | ORAL | 2 refills | Status: DC | PRN

## 2018-02-02 NOTE — Patient Instructions (Signed)

## 2018-02-02 NOTE — Assessment & Plan Note (Signed)
Deteriorated Increase Topamax to 50 mg daily, sedation caution given eRx for Imitrex to take for abortive therapy Try to keep a headache diary so that you can identify triggers and avoid them If no improvement, will refer to neurology vs headache wellness center

## 2018-02-02 NOTE — Progress Notes (Signed)
Subjective:    Patient ID: Kristen Ball, female    DOB: 05-14-1991, 27 y.o.   MRN: [id number]  HPI  Pt presents to the clinic today with c/o migraines. These have been occurring more frequently. She is having a migraine about 3 x week. The pain is on the entire left side of her head. She describes the pain as sharp, stabbing, and throbbing. She denies tearing of her left eye or drainage from her nasal passage. She has sensitivity to light but not sound. She denies dizziness, nausea or vomiting. She is taking Topamax daily but she reports this is not helping. She has tried Ibuprofen, Excedrin and Tylenol OTC without much relief.  Review of Systems      Past Medical History:  Diagnosis Date  . Asthma   . Chicken pox   . 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 Depression   . Migraines     Current Outpatient Medications  Medication Sig Dispense Refill  . albuterol (PROVENTIL HFA;VENTOLIN HFA) 108 (90 BASE) MCG/ACT inhaler Inhale 2 puffs into the lungs every 6 (six) hours as needed for wheezing or shortness of breath. (Patient not taking: Reported on 11/16/2017) 1 Inhaler 0  . OVER THE COUNTER MEDICATION Take 1 capsule by mouth daily. Tumeric    . sertraline (ZOLOFT) 25 MG tablet Take 1 tablet (25 mg total) by mouth daily. 30 tablet 2  . topiramate (TOPAMAX) 25 MG tablet Take one tablet at night for one week, then take 2 tablets at night 60 tablet 3   No current facility-administered medications for this visit.     No Known Allergies  Family History  Problem Relation Age of Onset  . Arthritis Mother   . Breast cancer Maternal Aunt   . Diabetes Paternal Grandmother   . Hypertension Paternal Grandfather   . Colon cancer Maternal Grandmother   . Migraines Neg Hx     Social History   Socioeconomic History  . Marital status: Single    Spouse name: Not on file  . Number of children: 0  . Years of education: Some coll  . Highest education level: Not on file    Occupational History  . Occupation: Marine scientist  Social Needs  . Financial resource strain: Not on file  . Food insecurity:    Worry: Not on file    Inability: Not on file  . Transportation needs:    Medical: Not on file    Non-medical: Not on file  Tobacco Use  . Smoking status: Current Every Day Smoker    Types: Cigars  . Smokeless tobacco: Never Used  . Tobacco comment: 1 cigar daily  Substance and Sexual Activity  . Alcohol use: Yes    Alcohol/week: 0.0 oz    Comment: moderate  . Drug use: No  . Sexual activity: Yes    Birth control/protection: Implant  Lifestyle  . Physical activity:    Days per week: Not on file    Minutes per session: Not on file  . Stress: Not on file  Relationships  . Social connections:    Talks on phone: Not on file    Gets together: Not on file    Attends religious service: Not on file    Active member of club or organization: Not on file    Attends meetings of clubs or organizations: Not on file    Relationship status: Not on file  . Intimate partner violence:    Fear of current or ex  partner: Not on file    Emotionally abused: Not on file    Physically abused: Not on file    Forced sexual activity: Not on file  Other Topics Concern  . Not on file  Social History Narrative   ** Merged History Encounter **       Work or School: works at post [employer] Situation: lives alone       Spiritual Beliefs: none      Lifestyle: no regular exercising; diet is poor      Right-handed   Rare caffeine use, occasional soda           Constitutional: Pt reports migraines. Denies fever, malaise, fatigue, or abrupt weight changes.  HEENT: Pt reports sensitivity to light. Denies eye pain, eye redness, ear pain, ringing in the ears, wax buildup, runny nose, nasal congestion, bloody nose, or sore throat. Neurological: Denies dizziness, difficulty with memory, difficulty with speech or problems with balance and coordination.    No other  specific complaints in a complete review of systems (except as listed in HPI above).  Objective:   Physical Exam   BP 102/68 (BP Location: Left Arm, Patient Position: Sitting, Cuff Size: Normal)   Pulse 94   Temp 98.6 F (37 C) (Oral)   Ht 5\' 4"  (1.626 m)   Wt 115 lb (52.2 kg)   LMP 01/25/2018 (LMP Unknown)   SpO2 99%   BMI 19.74 kg/m  Wt Readings from Last 3 Encounters:  02/02/18 115 lb (52.2 kg)  11/16/17 112 lb (50.8 kg)  04/01/17 105 lb 8 oz (47.9 kg)    General: Appears her stated age, well developed, well nourished in NAD. HEENT: Head: normal shape and size; Eyes: PERRLA and EOMs intact; Neurological: Alert and oriented.  Coordination normal.    BMET    Component Value Date/Time   NA 136 05/15/2016 2046   K 3.6 05/15/2016 2046   CL 103 05/15/2016 2046   CO2 26 05/15/2016 2046   GLUCOSE 101 (H) 05/15/2016 2046   BUN 8 05/15/2016 2046   CREATININE 0.57 05/15/2016 2046   CALCIUM 9.8 05/15/2016 2046   GFRNONAA >60 05/15/2016 2046   GFRAA >60 05/15/2016 2046    Lipid Panel  No results found for: CHOL, TRIG, HDL, CHOLHDL, VLDL, LDLCALC  CBC    Component Value Date/Time   WBC 7.5 04/01/2017 1322   RBC 4.48 04/01/2017 1322   HGB 13.3 04/01/2017 1322   HCT 39.9 04/01/2017 1322   PLT 208.0 04/01/2017 1322   MCV 89.0 04/01/2017 1322   MCH 29.1 05/15/2016 2046   MCHC 33.4 04/01/2017 1322   RDW 13.3 04/01/2017 1322   LYMPHSABS 3.0 05/15/2016 2046   MONOABS 0.5 05/15/2016 2046   EOSABS 0.2 05/15/2016 2046   BASOSABS 0.0 05/15/2016 2046    Hgb A1C No results found for: HGBA1C         Assessment & Plan:

## 2018-03-04 ENCOUNTER — Encounter: Payer: Self-pay | Admitting: Internal Medicine

## 2018-04-11 ENCOUNTER — Encounter: Payer: Self-pay | Admitting: Internal Medicine

## 2018-04-25 ENCOUNTER — Ambulatory Visit: Payer: No Typology Code available for payment source | Admitting: Internal Medicine

## 2018-04-25 ENCOUNTER — Encounter: Payer: Self-pay | Admitting: Internal Medicine

## 2018-04-25 VITALS — BP 106/68 | HR 72 | Temp 98.6°F | Wt 119.0 lb

## 2018-04-25 DIAGNOSIS — R202 Paresthesia of skin: Secondary | ICD-10-CM | POA: Diagnosis not present

## 2018-04-25 DIAGNOSIS — F329 Major depressive disorder, single episode, unspecified: Secondary | ICD-10-CM | POA: Diagnosis not present

## 2018-04-25 DIAGNOSIS — F419 Anxiety disorder, unspecified: Secondary | ICD-10-CM

## 2018-04-25 DIAGNOSIS — R609 Edema, unspecified: Secondary | ICD-10-CM

## 2018-04-25 LAB — COMPREHENSIVE METABOLIC PANEL
ALK PHOS: 70 U/L (ref 39–117)
ALT: 42 U/L — AB (ref 0–35)
AST: 28 U/L (ref 0–37)
Albumin: 4.5 g/dL (ref 3.5–5.2)
BILIRUBIN TOTAL: 0.8 mg/dL (ref 0.2–1.2)
BUN: 7 mg/dL (ref 6–23)
CO2: 29 meq/L (ref 19–32)
CREATININE: 0.63 mg/dL (ref 0.40–1.20)
Calcium: 9.7 mg/dL (ref 8.4–10.5)
Chloride: 102 mEq/L (ref 96–112)
GFR: 144.97 mL/min (ref >60.00)
GLUCOSE: 93 mg/dL (ref 70–99)
Potassium: 3.9 mEq/L (ref 3.5–5.1)
Sodium: 138 mEq/L (ref 135–145)
TOTAL PROTEIN: 7.6 g/dL (ref 6.0–8.3)

## 2018-04-25 LAB — VITAMIN B12: VITAMIN B 12: 770 pg/mL (ref 211–911)

## 2018-04-25 LAB — VITAMIN D 25 HYDROXY (VIT D DEFICIENCY, FRACTURES): VITD: 14.3 ng/mL — AB (ref 30.00–100.00)

## 2018-04-25 LAB — HEMOGLOBIN A1C: HEMOGLOBIN A1C: 5.5 % (ref 4.6–6.5)

## 2018-04-25 LAB — TSH: TSH: 1.29 u[IU]/mL (ref 0.35–4.50)

## 2018-04-25 NOTE — Assessment & Plan Note (Signed)
Deteriorated Continuous leave 6/24-/19- 05/22/18 Intermittent leave up to 4 days per month, each episode lasting 1-2 days, starting 05/22/18 Referral placed to psychology per pt request Discussed increasing Sertraline dose, pt hesitant at this time Pt contracts for safety, no concern of SI/HI.

## 2018-04-25 NOTE — Patient Instructions (Signed)
Paresthesia Paresthesia is a burning or prickling feeling. This feeling can happen in any part of the body. It often happens in the hands, arms, legs, or feet. Usually, it is not painful. In most cases, the feeling goes away in a short time and is not a sign of a serious problem. Follow these instructions at home:  Avoid drinking alcohol.  Try massage or needle therapy (acupuncture) to help with your problems.  Keep all follow-up visits as told by your doctor. This is important. Contact a doctor if:  You keep on having episodes of paresthesia.  Your burning or prickling feeling gets worse when you walk.  You have pain or cramps.  You feel dizzy.  You have a rash. Get help right away if:  You feel weak.  You have trouble walking or moving.  You have problems speaking, understanding, or seeing.  You feel confused.  You cannot control when you pee (urinate) or poop (bowel movement).  You lose feeling (numbness) after an injury.  You pass out (faint). This information is not intended to replace advice given to you by your health care provider. Make sure you discuss any questions you have with your health care provider. Document Released: 10/08/2008 Document Revised: 04/02/2016 Document Reviewed: 10/22/2014 Elsevier Interactive Patient Education  2018 Elsevier Inc.  

## 2018-04-25 NOTE — Progress Notes (Signed)
Subjective:    Patient ID: Kristen Ball, female    DOB: 25-Apr-1991, 27 y.o.   MRN: [id number]  HPI  Pt presents to the clinic today to follow up anxiety and depression. She feels like her anxiety and depression are worse. Her triggers include working 2 jobs, going to school. She has had some recent stressors in her life, her grandparents are sick. She has this glooming feeling that she doesn't want to live, but has not actually had any thoughts about harming herself or others. She has FMLA, but reports she has been exceeding the 2 days per month. She feels like a continuous leave for a few weeks might help her clear her mind. She is taking Sertraline, but has not really noticed that it has helped. She would like a referral for therapy.  She also reports persistent swelling of her lower extremities. This is worse in the afternoon, better in the morning. She denies associated shortness of breath. She stands 10 hours a day at her job, and thinks this is contributing. She has also recently started having some numbness and tingling of her feet. She denies any pain in her back. We have tried diuretics with minimal relief.  Review of Systems      Past Medical History:  Diagnosis Date  . Asthma   . Chicken pox   . 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 Depression   . Migraines     Current Outpatient Medications  Medication Sig Dispense Refill  . albuterol (PROVENTIL HFA;VENTOLIN HFA) 108 (90 BASE) MCG/ACT inhaler Inhale 2 puffs into the lungs every 6 (six) hours as needed for wheezing or shortness of breath. 1 Inhaler 0  . OVER THE COUNTER MEDICATION Take 1 capsule by mouth daily. Tumeric    . sertraline (ZOLOFT) 25 MG tablet Take 1 tablet (25 mg total) by mouth daily. 30 tablet 2  . SUMAtriptan (IMITREX) 25 MG tablet Take 1 tablet (25 mg total) by mouth every 2 (two) hours as needed for migraine. May repeat in 2 hours if headache persists or recurs. 10 tablet 2  . topiramate  (TOPAMAX) 50 MG tablet Take 1 tablet (50 mg total) by mouth daily. 30 tablet 2   No current facility-administered medications for this visit.     No Known Allergies  Family History  Problem Relation Age of Onset  . Arthritis Mother   . Breast cancer Maternal Aunt   . Diabetes Paternal Grandmother   . Hypertension Paternal Grandfather   . Colon cancer Maternal Grandmother   . Migraines Neg Hx     Social History   Socioeconomic History  . Marital status: Single    Spouse name: Not on file  . Number of children: 0  . Years of education: Some coll  . Highest education level: Not on file  Occupational History  . Occupation: Marine scientist  Social Needs  . Financial resource strain: Not on file  . Food insecurity:    Worry: Not on file    Inability: Not on file  . Transportation needs:    Medical: Not on file    Non-medical: Not on file  Tobacco Use  . Smoking status: Current Every Day Smoker    Types: Cigars  . Smokeless tobacco: Never Used  . Tobacco comment: 1 cigar daily  Substance and Sexual Activity  . Alcohol use: Yes    Alcohol/week: 0.0 oz    Comment: moderate  . Drug use: No  . Sexual activity: Yes  Birth control/protection: Implant  Lifestyle  . Physical activity:    Days per week: Not on file    Minutes per session: Not on file  . Stress: Not on file  Relationships  . Social connections:    Talks on phone: Not on file    Gets together: Not on file    Attends religious service: Not on file    Active member of club or organization: Not on file    Attends meetings of clubs or organizations: Not on file    Relationship status: Not on file  . Intimate partner violence:    Fear of current or ex partner: Not on file    Emotionally abused: Not on file    Physically abused: Not on file    Forced sexual activity: Not on file  Other Topics Concern  . Not on file  Social History Narrative   ** Merged History Encounter **       Work or School: works at  post [employer] Situation: lives alone       Spiritual Beliefs: none      Lifestyle: no regular exercising; diet is poor      Right-handed   Rare caffeine use, occasional soda           Constitutional: Denies fever, malaise, fatigue, headache or abrupt weight changes.  Respiratory: Denies difficulty breathing, shortness of breath, cough or sputum production.   Cardiovascular: Pt reports lower extremity swelling. Denies chest pain, chest tightness, palpitations or swelling in the hands.  Musculoskeletal: Denies decrease in range of motion, difficulty with gait, muscle pain or joint pain and swelling.  Skin: Denies redness, rashes, lesions or ulcercations.  Neurological: Pt reports numbness and tingling of bilateral lower extremities. Denies dizziness, difficulty with memory, difficulty with speech or problems with balance and coordination.  Psych: Pt reports anxiety and depression. Denies SI/HI.  No other specific complaints in a complete review of systems (except as listed in HPI above).  Objective:   Physical Exam    BP 106/68   Pulse 72   Temp 98.6 F (37 C) (Oral)   Wt 119 lb (54 kg)   SpO2 98%   BMI 20.43 kg/m  Wt Readings from Last 3 Encounters:  04/25/18 119 lb (54 kg)  02/02/18 115 lb (52.2 kg)  11/16/17 112 lb (50.8 kg)    General: Appears her stated age, well developed, well nourished in NAD. Skin: Warm, dry and intact.  Cardiovascular: Normal rate and rhythm. S1,S2 noted.  No murmur, rubs or gallops noted. 1+ non pitting, BLE edema noted. Pulmonary/Chest: Normal effort and positive vesicular breath sounds. No respiratory distress. No wheezes, rales or ronchi noted.  Musculoskeletal: Normal flexion, extension and rotation of the bilateral ankles. No difficulty with gait.  Neurological: Alert and oriented. Sensation intact to BLE. Psychiatric: Mood and affect normal. Behavior is normal. Judgment and thought content normal.     BMET    Component  Value Date/Time   NA 136 05/15/2016 2046   K 3.6 05/15/2016 2046   CL 103 05/15/2016 2046   CO2 26 05/15/2016 2046   GLUCOSE 101 (H) 05/15/2016 2046   BUN 8 05/15/2016 2046   CREATININE 0.57 05/15/2016 2046   CALCIUM 9.8 05/15/2016 2046   GFRNONAA >60 05/15/2016 2046   GFRAA >60 05/15/2016 2046    Lipid Panel  No results found for: CHOL, TRIG, HDL, CHOLHDL, VLDL, LDLCALC  CBC    Component Value Date/Time  WBC 7.5 04/01/2017 1322   RBC 4.48 04/01/2017 1322   HGB 13.3 04/01/2017 1322   HCT 39.9 04/01/2017 1322   PLT 208.0 04/01/2017 1322   MCV 89.0 04/01/2017 1322   MCH 29.1 05/15/2016 2046   MCHC 33.4 04/01/2017 1322   RDW 13.3 04/01/2017 1322   LYMPHSABS 3.0 05/15/2016 2046   MONOABS 0.5 05/15/2016 2046   EOSABS 0.2 05/15/2016 2046   BASOSABS 0.0 05/15/2016 2046    Hgb A1C No results found for: HGBA1C        Assessment & Plan:   Peripheral Edema:  Advised her to consume a low sodium diet Advised her to try wearing compression hose at work If no improvement, consider referral to vascular for further evaluation  Paresthesia of BLE:  Will check CMET, TSH, A1C, Vit D and B12  Return precautions discussed [name], NP

## 2018-04-26 ENCOUNTER — Telehealth: Payer: Self-pay | Admitting: Internal Medicine

## 2018-04-26 ENCOUNTER — Other Ambulatory Visit: Payer: Self-pay | Admitting: Internal Medicine

## 2018-04-26 ENCOUNTER — Encounter: Payer: Self-pay | Admitting: Internal Medicine

## 2018-04-26 DIAGNOSIS — E559 Vitamin D deficiency, unspecified: Secondary | ICD-10-CM

## 2018-04-26 DIAGNOSIS — Z0279 Encounter for issue of other medical certificate: Secondary | ICD-10-CM

## 2018-04-26 MED ORDER — VITAMIN D (ERGOCALCIFEROL) 1.25 MG (50000 UNIT) PO CAPS: 50000.0000 [IU] | ORAL_CAPSULE | ORAL | 0 refills | Status: DC

## 2018-04-26 NOTE — Telephone Encounter (Signed)
fmla paperwork in [facility] in box °

## 2018-04-26 NOTE — Telephone Encounter (Signed)
Done, given back to Robin 

## 2018-04-26 NOTE — Telephone Encounter (Signed)
Paperwork faxed °

## 2018-04-28 NOTE — Telephone Encounter (Signed)
Pt aware °Copy for pt °Copy for scan °Copy for billing °

## 2018-05-20 ENCOUNTER — Ambulatory Visit: Payer: No Typology Code available for payment source | Admitting: Psychology

## 2018-05-20 DIAGNOSIS — F332 Major depressive disorder, recurrent severe without psychotic features: Secondary | ICD-10-CM

## 2018-06-03 ENCOUNTER — Ambulatory Visit: Payer: No Typology Code available for payment source | Admitting: Psychology

## 2018-06-03 DIAGNOSIS — F332 Major depressive disorder, recurrent severe without psychotic features: Secondary | ICD-10-CM | POA: Diagnosis not present

## 2018-06-10 ENCOUNTER — Ambulatory Visit: Payer: Self-pay | Admitting: Psychology

## 2018-07-10 ENCOUNTER — Encounter (HOSPITAL_COMMUNITY): Payer: Self-pay | Admitting: Emergency Medicine

## 2018-07-10 ENCOUNTER — Emergency Department (HOSPITAL_COMMUNITY): Payer: No Typology Code available for payment source

## 2018-07-10 ENCOUNTER — Other Ambulatory Visit: Payer: Self-pay

## 2018-07-10 ENCOUNTER — Emergency Department (HOSPITAL_COMMUNITY)
Admission: EM | Admit: 2018-07-10 | Discharge: 2018-07-10 | Disposition: A | Discharge status: Home / Self Care | Payer: No Typology Code available for payment source | Attending: Emergency Medicine | Admitting: Emergency Medicine

## 2018-07-10 DIAGNOSIS — J45909 Unspecified asthma, uncomplicated: Secondary | ICD-10-CM | POA: Insufficient documentation

## 2018-07-10 DIAGNOSIS — F17290 Nicotine dependence, other tobacco product, uncomplicated: Secondary | ICD-10-CM | POA: Insufficient documentation

## 2018-07-10 DIAGNOSIS — Z79899 Other long term (current) drug therapy: Secondary | ICD-10-CM | POA: Insufficient documentation

## 2018-07-10 DIAGNOSIS — R600 Localized edema: Secondary | ICD-10-CM | POA: Diagnosis present

## 2018-07-10 LAB — CBC WITH DIFFERENTIAL/PLATELET
Abs Immature Granulocytes: 0 10*3/uL (ref 0.0–0.1)
BASOS ABS: 0 10*3/uL (ref 0.0–0.1)
BASOS PCT: 1 %
EOS ABS: 0.1 10*3/uL (ref 0.0–0.7)
Eosinophils Relative: 3 %
HCT: 35.9 % — ABNORMAL LOW (ref 36.0–46.0)
Hemoglobin: 11.4 g/dL — ABNORMAL LOW (ref 12.0–15.0)
Immature Granulocytes: 0 %
Lymphocytes Relative: 36 %
Lymphs Abs: 1.7 10*3/uL (ref 0.7–4.0)
MCH: 29.4 pg (ref 26.0–34.0)
MCHC: 31.8 g/dL (ref 30.0–36.0)
MCV: 92.5 fL (ref 78.0–100.0)
Monocytes Absolute: 0.4 10*3/uL (ref 0.1–1.0)
Monocytes Relative: 9 %
NEUTROS ABS: 2.4 10*3/uL (ref 1.7–7.7)
NEUTROS PCT: 51 %
PLATELETS: 188 10*3/uL (ref 150–400)
RBC: 3.88 MIL/uL (ref 3.87–5.11)
RDW: 12.2 % (ref 11.5–15.5)
WBC: 4.7 10*3/uL (ref 4.0–10.5)

## 2018-07-10 LAB — URINALYSIS, ROUTINE W REFLEX MICROSCOPIC
Bilirubin Urine: NEGATIVE
GLUCOSE, UA: NEGATIVE mg/dL
KETONES UR: NEGATIVE mg/dL
Leukocytes, UA: NEGATIVE
Nitrite: NEGATIVE
PH: 6 (ref 5.0–8.0)
PROTEIN: NEGATIVE mg/dL
Specific Gravity, Urine: 1.013 (ref 1.005–1.030)

## 2018-07-10 LAB — COMPREHENSIVE METABOLIC PANEL
ALK PHOS: 64 U/L (ref 38–126)
ALT: 17 U/L (ref 0–44)
ANION GAP: 6 (ref 5–15)
AST: 17 U/L (ref 15–41)
Albumin: 3.7 g/dL (ref 3.5–5.0)
BILIRUBIN TOTAL: 0.9 mg/dL (ref 0.3–1.2)
BUN: 8 mg/dL (ref 6–20)
CALCIUM: 8.7 mg/dL — AB (ref 8.9–10.3)
CO2: 27 mmol/L (ref 22–32)
CREATININE: 0.64 mg/dL (ref 0.44–1.00)
Chloride: 105 mmol/L (ref 98–111)
Glucose, Bld: 94 mg/dL (ref 70–99)
Potassium: 3.8 mmol/L (ref 3.5–5.1)
Sodium: 138 mmol/L (ref 135–145)
TOTAL PROTEIN: 6.4 g/dL — AB (ref 6.5–8.1)

## 2018-07-10 LAB — I-STAT BETA HCG BLOOD, ED (MC, WL, AP ONLY): I-stat hCG, quantitative: <5 m[IU]/mL (ref <5)

## 2018-07-10 LAB — BRAIN NATRIURETIC PEPTIDE: B NATRIURETIC PEPTIDE 5: 8.2 pg/mL (ref 0.0–100.0)

## 2018-07-10 NOTE — ED Notes (Signed)
Pt aware of need for urine sample, cup at bedside.

## 2018-07-10 NOTE — ED Provider Notes (Signed)
[facility] EMERGENCY DEPARTMENT Provider Note   CSN: [id number] Arrival date & time: 07/10/18  4742     History   Chief Complaint Chief Complaint  Patient presents with  . Leg Swelling    bilateral    HPI Kristen Ball is a 27 y.o. female.  HPI   Kristen Ball is a 27 y.o. female, with a history of asthma, presenting to the ED with bilateral peripheral lower extremity edema beginning in April 2019. States she began working at [employer] around this time and has been eating [employer] every day.  She is also on her feet frequently during the course of her job.  She endorses discomfort around the ankles. Patient was seen by her PCP for the same complaint April 25, 2018.  She was advised to switch to a low-sodium diet and wear compression stockings at work.  She has done neither of these things. Denies fever/chills, color change, numbness, weakness, chest pain, shortness of breath, abdominal pain, urinary symptoms, back pain, or any other complaints.   Past Medical History:  Diagnosis Date  . Asthma   . Chicken pox   . 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 Depression   . Migraines     Patient Active Problem List   Diagnosis Date Noted  . Migraines 01/02/2016  . Asthma, mild intermittent 01/02/2016  . Anxiety and depression 01/02/2016    Past Surgical History:  Procedure Laterality Date  . BREAST EXCISIONAL BIOPSY Left 07/2015   benign  . DILATION AND CURETTAGE OF UTERUS  2011  . DILATION AND CURETTAGE OF UTERUS       OB History    Gravida  0   Para  0   Term  0   Preterm  0   AB  0   Living        SAB  0   TAB  0   Ectopic  0   Multiple      Live Births               Home Medications    Prior to Admission medications   Medication Sig Start Date End Date Taking? Authorizing Provider  sertraline (ZOLOFT) 25 MG tablet Take 1 tablet (25 mg total) by mouth daily. 11/16/17  Yes Jearld Fenton, NP    topiramate (TOPAMAX) 50 MG tablet Take 1 tablet (50 mg total) by mouth daily. 02/02/18  Yes Baity, Coralie Keens, NP  albuterol (PROVENTIL HFA;VENTOLIN HFA) 108 (90 BASE) MCG/ACT inhaler Inhale 2 puffs into the lungs every 6 (six) hours as needed for wheezing or shortness of breath. Patient not taking: Reported on 07/10/2018 01/02/14   Lucretia Kern, DO  SUMAtriptan (IMITREX) 25 MG tablet Take 1 tablet (25 mg total) by mouth every 2 (two) hours as needed for migraine. May repeat in 2 hours if headache persists or recurs. Patient not taking: Reported on 07/10/2018 02/02/18   Jearld Fenton, NP  Vitamin D, Ergocalciferol, (DRISDOL) 50000 units CAPS capsule Take 1 capsule (50,000 Units total) by mouth every 7 (seven) days. Patient not taking: Reported on 07/10/2018 04/26/18   Jearld Fenton, NP    Family History Family History  Problem Relation Age of Onset  . Arthritis Mother   . Breast cancer Maternal Aunt   . Diabetes Paternal Grandmother   . Hypertension Paternal Grandfather   . Colon cancer Maternal Grandmother   . Migraines Neg Hx     Social History Social History  Tobacco Use  . Smoking status: Current Every Day Smoker    Types: Cigars  . Smokeless tobacco: Never Used  . Tobacco comment: 1 cigar daily  Substance Use Topics  . Alcohol use: Yes    Alcohol/week: 0.0 standard drinks    Comment: moderate  . Drug use: No     Allergies   Patient has no known allergies.   Review of Systems Review of Systems  Constitutional: Negative for chills and fever.  Respiratory: Negative for shortness of breath.   Cardiovascular: Positive for leg swelling. Negative for chest pain.  Gastrointestinal: Negative for abdominal pain, nausea and vomiting.  Neurological: Negative for weakness and numbness.  All other systems reviewed and are negative.    Physical Exam Updated Vital Signs BP (!) 132/92   Pulse 67   Temp 98.5 F (36.9 C)   Resp 16   LMP 07/10/2018   SpO2 100%   Physical  Exam  Constitutional: She appears well-developed and well-nourished. No distress.  HENT:  Head: Normocephalic and atraumatic.  Eyes: Conjunctivae are normal.  Neck: Neck supple.  Cardiovascular: Normal rate, regular rhythm, normal heart sounds and intact distal pulses.  Pulses:      Dorsalis pedis pulses are 2+ on the right side, and 2+ on the left side.       Posterior tibial pulses are 2+ on the right side, and 2+ on the left side.  Pulmonary/Chest: Effort normal and breath sounds normal. No respiratory distress.  Abdominal: Soft. There is no tenderness. There is no guarding.  Musculoskeletal: She exhibits edema. She exhibits no tenderness.  Patient has questionable bilateral lower extremity edema in the area of the ankles.  No tenderness, increased warmth, erythema.  No tenderness to the calves.  No pitting edema. Full range of motion in the bilateral ankles without difficulty.  Lymphadenopathy:    She has no cervical adenopathy.  Neurological: She is alert.  Sensation to light touch grossly intact in the bilateral lower extremities. Strength 5/5 in the bilateral lower extremities. Ambulatory without assistance.  Skin: Skin is warm and dry. She is not diaphoretic.  Psychiatric: She has a normal mood and affect. Her behavior is normal.  Nursing note and vitals reviewed.    ED Treatments / Results  Labs (all labs ordered are listed, but only abnormal results are displayed) Labs Reviewed  COMPREHENSIVE METABOLIC PANEL - Abnormal; Notable for the following components:      Result Value   Calcium 8.7 (*)    Total Protein 6.4 (*)    All other components within normal limits  CBC WITH DIFFERENTIAL/PLATELET - Abnormal; Notable for the following components:   Hemoglobin 11.4 (*)    HCT 35.9 (*)    All other components within normal limits  URINALYSIS, ROUTINE W REFLEX MICROSCOPIC - Abnormal; Notable for the following components:   Hgb urine dipstick LARGE (*)    Bacteria, UA RARE  (*)    All other components within normal limits  BRAIN NATRIURETIC PEPTIDE  I-STAT BETA HCG BLOOD, ED ([facility], [facility], [facility] ONLY)    EKG EKG Interpretation  Date/Time:  Sunday July 10 2018 09:45:47 EDT Ventricular Rate:  65 PR Interval:    QRS Duration: 76 QT Interval:  424 QTC Calculation: 441 R Axis:   71 Text Interpretation:  Sinus rhythm Low voltage, precordial leads No previous ECGs available Confirmed by Fredia Sorrow [phone]) on 07/10/2018 9:48:31 AM   Radiology Dg Chest 2 View  Result Date: 07/10/2018 CLINICAL DATA:  Peripheral  edema EXAM: CHEST - 2 VIEW COMPARISON:  11/11/2017 FINDINGS: The heart size and mediastinal contours are within normal limits. Both lungs are clear. The visualized skeletal structures are unremarkable. IMPRESSION: No active cardiopulmonary disease. Electronically Signed   By: Jerilynn Mages.  Shick M.D.   On: 07/10/2018 10:11    Procedures Procedures (including critical care time)  Medications Ordered in ED Medications - No data to display   Initial Impression / Assessment and Plan / ED Course  I have reviewed the triage vital signs and the nursing notes.  Pertinent labs & imaging results that were available during my care of the patient were reviewed by me and considered in my medical decision making (see chart for details).  Clinical Course as of Jul 10 1326  Nancy Fetter Jul 10, 2018  1327 Patient is currently menstruating.  Hgb urine dipstick(!): LARGE [SJ]    Clinical Course User Index [SJ] Carlyon Nolasco C, PA-C    Patient presents with bilateral lower extremity edema for the past several months.  Her diet and time spent standing as part of her job could very likely be contributing factors.  She has thus far not made the changes recommended by her PCP on this matter.  CMP, TSH, hemoglobin A1c, and B12 were all normal at that time.  Vitamin D was found to be low and supplementation therapy was initiated by the PCP.  Lab work today reassuring. She has been advised  to institute the recommended changes with an explanation for the reasoning.  She will follow-up with her PCP on this matter.    Vitals:   07/10/18 1145 07/10/18 1200 07/10/18 1215 07/10/18 1245  BP: 106/78 108/73 101/71 102/72  Pulse: 83 73 71 73  Resp:  14    Temp:      SpO2: 100% 100% 99% 100%      Final Clinical Impressions(s) / ED Diagnoses   Final diagnoses:  Bilateral lower extremity edema    ED Discharge Orders    None       Layla Maw 07/10/18 1327    Fredia Sorrow, MD 07/10/18 1631

## 2018-07-10 NOTE — Discharge Instructions (Signed)
Lab results were reassuring.  Please alter your diet to include lower sodium and wear compression stockings as previously suggested.  Follow-up with your primary care provider on this matter for any further management.

## 2018-07-10 NOTE — ED Notes (Signed)
Patient transported to X-ray 

## 2018-07-10 NOTE — ED Notes (Signed)
ED Provider at bedside. 

## 2018-07-10 NOTE — ED Triage Notes (Signed)
Pt states "my legs have gotten swollen since I was 14" States she has tried "water pills and compression socks" in the past. Both legs, ankles and feet are swollen. She has generalized pain to the legs.

## 2018-07-21 ENCOUNTER — Telehealth: Payer: Self-pay

## 2018-07-21 NOTE — Telephone Encounter (Signed)
Copied from [place]. 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 She states this needs to be done in less than 2 weeks. Pt is requesting to be worked in with any provider. Please advise.  Can patient be worked in for [place] next week? Patient sees [name], NP usually. Thank Edrick Kins, RMA

## 2018-07-22 NOTE — Telephone Encounter (Signed)
Pt has been scheduled for Tues 07/26/18 for CPE

## 2018-07-26 ENCOUNTER — Encounter: Payer: Self-pay | Admitting: Internal Medicine

## 2018-07-26 ENCOUNTER — Ambulatory Visit: Payer: No Typology Code available for payment source | Admitting: Internal Medicine

## 2018-07-26 DIAGNOSIS — Z0289 Encounter for other administrative examinations: Secondary | ICD-10-CM

## 2018-07-26 NOTE — Progress Notes (Deleted)
Subjective:    Patient ID: Kristen Ball, female    DOB: July 24, 1991, 27 y.o.   MRN: [id number]  HPI  Pt presents to the clinic today for her annual exam. She is also due to follow up chronic conditions.  Asthma: Mild, intermittent. She uses Albuterol inhaler as needed.  Migraines: Stress induced. She is taking Topamax and Imitrex as prescribed. She is not following with neurology.  Depression: Chronic but stable on Sertraline. She denies anxiety, SI/HI.  Flu: Tetanus: Pap Smear: Dentist:  Diet: Exercise:  Review of Systems      Past Medical History:  Diagnosis Date  . Asthma   . Chicken pox   . 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 Depression   . Migraines     Current Outpatient Medications  Medication Sig Dispense Refill  . albuterol (PROVENTIL HFA;VENTOLIN HFA) 108 (90 BASE) MCG/ACT inhaler Inhale 2 puffs into the lungs every 6 (six) hours as needed for wheezing or shortness of breath. (Patient not taking: Reported on 07/10/2018) 1 Inhaler 0  . sertraline (ZOLOFT) 25 MG tablet Take 1 tablet (25 mg total) by mouth daily. 30 tablet 2  . SUMAtriptan (IMITREX) 25 MG tablet Take 1 tablet (25 mg total) by mouth every 2 (two) hours as needed for migraine. May repeat in 2 hours if headache persists or recurs. (Patient not taking: Reported on 07/10/2018) 10 tablet 2  . topiramate (TOPAMAX) 50 MG tablet Take 1 tablet (50 mg total) by mouth daily. 30 tablet 2  . Vitamin D, Ergocalciferol, (DRISDOL) 50000 units CAPS capsule Take 1 capsule (50,000 Units total) by mouth every 7 (seven) days. (Patient not taking: Reported on 07/10/2018) 12 capsule 0   No current facility-administered medications for this visit.     No Known Allergies  Family History  Problem Relation Age of Onset  . Arthritis Mother   . Breast cancer Maternal Aunt   . Diabetes Paternal Grandmother   . Hypertension Paternal Grandfather   . Colon cancer Maternal Grandmother   . Migraines Neg Hx       Social History   Socioeconomic History  . Marital status: Single    Spouse name: Not on file  . Number of children: 0  . Years of education: Some coll  . Highest education level: Not on file  Occupational History  . Occupation: Marine scientist  Social Needs  . Financial resource strain: Not on file  . Food insecurity:    Worry: Not on file    Inability: Not on file  . Transportation needs:    Medical: Not on file    Non-medical: Not on file  Tobacco Use  . Smoking status: Current Every Day Smoker    Types: Cigars  . Smokeless tobacco: Never Used  . Tobacco comment: 1 cigar daily  Substance and Sexual Activity  . Alcohol use: Yes    Alcohol/week: 0.0 standard drinks    Comment: moderate  . Drug use: No  . Sexual activity: Yes    Birth control/protection: Implant  Lifestyle  . Physical activity:    Days per week: Not on file    Minutes per session: Not on file  . Stress: Not on file  Relationships  . Social connections:    Talks on phone: Not on file    Gets together: Not on file    Attends religious service: Not on file    Active member of club or organization: Not on file    Attends meetings of  clubs or organizations: Not on file    Relationship status: Not on file  . Intimate partner violence:    Fear of current or ex partner: Not on file    Emotionally abused: Not on file    Physically abused: Not on file    Forced sexual activity: Not on file  Other Topics Concern  . Not on file  Social History Narrative   ** Merged History Encounter **       Work or School: works at post [employer] Situation: lives alone       Spiritual Beliefs: none      Lifestyle: no regular exercising; diet is poor      Right-handed   Rare caffeine use, occasional soda           Constitutional: Denies fever, malaise, fatigue, headache or abrupt weight changes.  HEENT: Denies eye pain, eye redness, ear pain, ringing in the ears, wax buildup, runny nose, nasal  congestion, bloody nose, or sore throat. Respiratory: Denies difficulty breathing, shortness of breath, cough or sputum production.   Cardiovascular: Denies chest pain, chest tightness, palpitations or swelling in the hands or feet.  Gastrointestinal: Denies abdominal pain, bloating, constipation, diarrhea or blood in the stool.  GU: Denies urgency, frequency, pain with urination, burning sensation, blood in urine, odor or discharge. Musculoskeletal: Denies decrease in range of motion, difficulty with gait, muscle pain or joint pain and swelling.  Skin: Denies redness, rashes, lesions or ulcercations.  Neurological: Denies dizziness, difficulty with memory, difficulty with speech or problems with balance and coordination.  Psych: Denies anxiety, depression, SI/HI.  No other specific complaints in a complete review of systems (except as listed in HPI above).  Objective:   Physical Exam        Assessment & Plan:

## 2018-08-01 NOTE — Telephone Encounter (Signed)
Pt states she can take anything and go ahead and schedule her.

## 2018-08-01 NOTE — Telephone Encounter (Signed)
Pt called and stated that she had to miss her last appointment and needs to been seen before October 1st. Can she be seen with a different provider. Please advise

## 2018-08-02 NOTE — Telephone Encounter (Signed)
Pt aware.

## 2018-08-02 NOTE — Telephone Encounter (Signed)
She was worked in and no showed. I don't think she should be seen by another provider.

## 2018-08-02 NOTE — Telephone Encounter (Signed)
See below message can pt be worked in

## 2018-08-17 ENCOUNTER — Encounter: Payer: Self-pay | Admitting: Internal Medicine

## 2018-08-17 ENCOUNTER — Ambulatory Visit (INDEPENDENT_AMBULATORY_CARE_PROVIDER_SITE_OTHER): Payer: No Typology Code available for payment source | Admitting: Internal Medicine

## 2018-08-17 VITALS — BP 106/68 | HR 83 | Temp 98.2°F | Ht 64.0 in | Wt 116.0 lb

## 2018-08-17 DIAGNOSIS — Z0000 Encounter for general adult medical examination without abnormal findings: Secondary | ICD-10-CM

## 2018-08-17 DIAGNOSIS — Z23 Encounter for immunization: Secondary | ICD-10-CM

## 2018-08-17 DIAGNOSIS — F419 Anxiety disorder, unspecified: Secondary | ICD-10-CM

## 2018-08-17 DIAGNOSIS — J4520 Mild intermittent asthma, uncomplicated: Secondary | ICD-10-CM

## 2018-08-17 DIAGNOSIS — G43C0 Periodic headache syndromes in child or adult, not intractable: Secondary | ICD-10-CM | POA: Diagnosis not present

## 2018-08-17 DIAGNOSIS — Z0001 Encounter for general adult medical examination with abnormal findings: Secondary | ICD-10-CM

## 2018-08-17 DIAGNOSIS — F329 Major depressive disorder, single episode, unspecified: Secondary | ICD-10-CM | POA: Diagnosis not present

## 2018-08-17 DIAGNOSIS — F32A Depression, unspecified: Secondary | ICD-10-CM

## 2018-08-17 NOTE — Assessment & Plan Note (Signed)
Chronic but stable off meds She will continue to follow with psychology

## 2018-08-17 NOTE — Assessment & Plan Note (Signed)
Continue Albuterol prn 

## 2018-08-17 NOTE — Assessment & Plan Note (Signed)
Controlled on Topamax and Imitrex Will monitor

## 2018-08-17 NOTE — Progress Notes (Signed)
Subjective:    Patient ID: Kristen Ball, female    DOB: [birth year], 27 y.o.   MRN: [id number]  HPI  Pt presents to the clinic today for her annual exam. She is also due to follow up chronic conditions.  Migraines: Triggered by heat. She is taking Topamax and Imitrex as prescribed with good relief.  Anxiety and Depression: Chronic, no longer taking Sertraline. She is following with psychology. She denies SI/HI.  Asthma: Mild, intermittent. She uses Albuterol as needed with good relief.   Flu: never Tetanus: > 10 years ago Pap Smear: 11/2016, [place] Dentist: as needed   Diet: She does eat meat. She does not eat any fruits or veggies. She does eat fried foods. She drinks mostly water and soda. Exercise: None  Review of Systems  Past Medical History:  Diagnosis Date  . Asthma   . Chicken pox   . 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 Depression   . Migraines     Current Outpatient Medications  Medication Sig Dispense Refill  . albuterol (PROVENTIL HFA;VENTOLIN HFA) 108 (90 BASE) MCG/ACT inhaler Inhale 2 puffs into the lungs every 6 (six) hours as needed for wheezing or shortness of breath. 1 Inhaler 0  . SUMAtriptan (IMITREX) 25 MG tablet Take 1 tablet (25 mg total) by mouth every 2 (two) hours as needed for migraine. May repeat in 2 hours if headache persists or recurs. 10 tablet 2  . topiramate (TOPAMAX) 50 MG tablet Take 1 tablet (50 mg total) by mouth daily. 30 tablet 2   No current facility-administered medications for this visit.     No Known Allergies  Family History  Problem Relation Age of Onset  . Arthritis Mother   . Breast cancer Maternal Aunt   . Diabetes Paternal Grandmother   . Hypertension Paternal Grandfather   . Colon cancer Maternal Grandmother   . Migraines Neg Hx     Social History   Socioeconomic History  . Marital status: Single    Spouse name: Not on file  . Number of children: 0  . Years of education: Some  coll  . Highest education level: Not on file  Occupational History  . Occupation: Marine scientist  Social Needs  . Financial resource strain: Not on file  . Food insecurity:    Worry: Not on file    Inability: Not on file  . Transportation needs:    Medical: Not on file    Non-medical: Not on file  Tobacco Use  . Smoking status: Current Every Day Smoker    Types: Cigars  . Smokeless tobacco: Never Used  . Tobacco comment: 1 cigar daily  Substance and Sexual Activity  . Alcohol use: Yes    Alcohol/week: 0.0 standard drinks    Comment: moderate  . Drug use: No  . Sexual activity: Yes    Birth control/protection: Implant  Lifestyle  . Physical activity:    Days per week: Not on file    Minutes per session: Not on file  . Stress: Not on file  Relationships  . Social connections:    Talks on phone: Not on file    Gets together: Not on file    Attends religious service: Not on file    Active member of club or organization: Not on file    Attends meetings of clubs or organizations: Not on file    Relationship status: Not on file  . Intimate partner violence:    Fear of  current or ex partner: Not on file    Emotionally abused: Not on file    Physically abused: Not on file    Forced sexual activity: Not on file  Other Topics Concern  . Not on file  Social History Narrative   ** Merged History Encounter **       Work or School: works at post [employer] Situation: lives alone       Spiritual Beliefs: none      Lifestyle: no regular exercising; diet is poor      Right-handed   Rare caffeine use, occasional soda           Constitutional: Pt reports intermittent headaches. Denies fever, malaise, fatigue, or abrupt weight changes.  HEENT: Denies eye pain, eye redness, ear pain, ringing in the ears, wax buildup, runny nose, nasal congestion, bloody nose, or sore throat. Respiratory: Denies difficulty breathing, shortness of breath, cough or sputum production.     Cardiovascular: Denies chest pain, chest tightness, palpitations or swelling in the hands or feet.  Gastrointestinal: Denies abdominal pain, bloating, constipation, diarrhea or blood in the stool.  GU: Denies urgency, frequency, pain with urination, burning sensation, blood in urine, odor or discharge. Musculoskeletal: Denies decrease in range of motion, difficulty with gait, muscle pain or joint pain and swelling.  Skin: Denies redness, rashes, lesions or ulcercations.  Neurological: Denies dizziness, difficulty with memory, difficulty with speech or problems with balance and coordination.  Psych: Pt reports anxiety and depression. Denies SI/HI.  No other specific complaints in a complete review of systems (except as listed in HPI above).     Objective:   Physical Exam   BP 106/68   Pulse 83   Temp 98.2 F (36.8 C) (Oral)   Ht 5\' 4"  (1.626 m)   Wt 116 lb (52.6 kg)   SpO2 99%   BMI 19.91 kg/m  Wt Readings from Last 3 Encounters:  08/17/18 116 lb (52.6 kg)  04/25/18 119 lb (54 kg)  02/02/18 115 lb (52.2 kg)    General: Appears her stated age, well developed, well nourished in NAD. Skin: Warm, dry and intact. Acne noted on face. HEENT: Head: normal shape and size; Eyes: sclera white, no icterus, conjunctiva pink, PERRLA and EOMs intact; Ears: Tm's gray and intact, normal light reflex; Throat/Mouth: Teeth present, mucosa pink and moist, no exudate, lesions or ulcerations noted.  Neck:  Neck supple, trachea midline. No masses, lumps or thyromegaly present.  Cardiovascular: Normal rate and rhythm. S1,S2 noted.  No murmur, rubs or gallops noted. No JVD or BLE edema.  Pulmonary/Chest: Normal effort and positive vesicular breath sounds. No respiratory distress. No wheezes, rales or ronchi noted.  Abdomen: Soft and nontender. Normal bowel sounds. No distention or masses noted. Liver, spleen and kidneys non palpable. Musculoskeletal: Strength 5/5 BUE/BLE. No difficulty with gait.   Neurological: Alert and oriented. Cranial nerves II-XII grossly intact. Coordination normal.  Psychiatric: Mood and affect normal. Behavior is normal. Judgment and thought content normal.     BMET    Component Value Date/Time   NA 138 07/10/2018 0942   K 3.8 07/10/2018 0942   CL 105 07/10/2018 0942   CO2 27 07/10/2018 0942   GLUCOSE 94 07/10/2018 0942   BUN 8 07/10/2018 0942   CREATININE 0.64 07/10/2018 0942   CALCIUM 8.7 (L) 07/10/2018 0942   GFRNONAA >60 07/10/2018 0942   GFRAA >60 07/10/2018 0942    Lipid Panel  No results found  for: CHOL, TRIG, HDL, CHOLHDL, VLDL, LDLCALC  CBC    Component Value Date/Time   WBC 4.7 07/10/2018 0942   RBC 3.88 07/10/2018 0942   HGB 11.4 (L) 07/10/2018 0942   HCT 35.9 (L) 07/10/2018 0942   PLT 188 07/10/2018 0942   MCV 92.5 07/10/2018 0942   MCH 29.4 07/10/2018 0942   MCHC 31.8 07/10/2018 0942   RDW 12.2 07/10/2018 0942   LYMPHSABS 1.7 07/10/2018 0942   MONOABS 0.4 07/10/2018 0942   EOSABS 0.1 07/10/2018 0942   BASOSABS 0.0 07/10/2018 0942    Hgb A1C Lab Results  Component Value Date   HGBA1C 5.5 04/25/2018           Assessment & Plan:   Preventative Health Maintenance:  Flu and Tdap today Pap smear UTD Encouraged her to consume a balanced diet and exercise regimen Advised her to see a dentist annually Labs from 04/2018 reviewed, repeat labs not indicated  RTC in 1 year, sooner if needed [name], NP

## 2018-08-17 NOTE — Patient Instructions (Signed)

## 2018-08-18 NOTE — Addendum Note (Signed)
Addended by: Lurlean Nanny on: 08/18/2018 09:50 AM   Modules accepted: Orders

## 2018-08-19 ENCOUNTER — Telehealth: Payer: Self-pay

## 2018-08-19 NOTE — Telephone Encounter (Signed)
Copied from [place] [id number]. Topic: General - Inquiry >> Aug 19, 2018 11:06 AM Vernona Rieger wrote: Reason for CRM: Patient would like to know if Melaine received her immunizations that she faxed over this morning.

## 2018-08-19 NOTE — Telephone Encounter (Signed)
Spoke to pt and she is aware that I have received her immunizations and Garnette Gunner will be in the office this afternoon. I will call her with further instructions

## 2018-08-24 ENCOUNTER — Other Ambulatory Visit: Payer: Self-pay | Admitting: Internal Medicine

## 2018-08-24 DIAGNOSIS — Z0184 Encounter for antibody response examination: Secondary | ICD-10-CM

## 2018-08-29 ENCOUNTER — Telehealth: Payer: Self-pay

## 2018-08-29 NOTE — Telephone Encounter (Signed)
Copied from [place] [id number]. Topic: General - Other >> Aug 25, 2018  3:42 PM Carolyn Stare wrote:  Pt ask that Melody give her a call >> Aug 25, 2018  4:41 PM Helene Shoe, LPN wrote: See 11/6578 phone note.

## 2018-08-29 NOTE — Telephone Encounter (Signed)
Pt is not sure when she can come in but she will need a lab only appt for titers of MMR, Varicella, and Hep B also a nurse visit for skin TB test  Pt will call back to schedule but she is aware of what she needs labs have been future ordered

## 2018-09-14 ENCOUNTER — Other Ambulatory Visit (INDEPENDENT_AMBULATORY_CARE_PROVIDER_SITE_OTHER): Payer: No Typology Code available for payment source

## 2018-09-14 ENCOUNTER — Ambulatory Visit (INDEPENDENT_AMBULATORY_CARE_PROVIDER_SITE_OTHER): Payer: No Typology Code available for payment source | Admitting: *Deleted

## 2018-09-14 DIAGNOSIS — Z111 Encounter for screening for respiratory tuberculosis: Secondary | ICD-10-CM

## 2018-09-14 DIAGNOSIS — Z0184 Encounter for antibody response examination: Secondary | ICD-10-CM

## 2018-09-14 DIAGNOSIS — E559 Vitamin D deficiency, unspecified: Secondary | ICD-10-CM | POA: Diagnosis not present

## 2018-09-14 NOTE — Progress Notes (Signed)
Per orders of Dr. Garnette Gunner, injection of PPD given by Lauralyn Primes. Patient tolerated injection well.

## 2018-09-14 NOTE — Addendum Note (Signed)
Addended by: Ellamae Sia on: 09/14/2018 12:17 PM   Modules accepted: Orders

## 2018-09-15 ENCOUNTER — Other Ambulatory Visit: Payer: Self-pay | Admitting: Internal Medicine

## 2018-09-15 DIAGNOSIS — E559 Vitamin D deficiency, unspecified: Secondary | ICD-10-CM

## 2018-09-15 MED ORDER — VITAMIN D (ERGOCALCIFEROL) 1.25 MG (50000 UNIT) PO CAPS: 50000.0000 [IU] | ORAL_CAPSULE | ORAL | 0 refills | Status: DC

## 2018-09-16 ENCOUNTER — Other Ambulatory Visit (INDEPENDENT_AMBULATORY_CARE_PROVIDER_SITE_OTHER): Payer: No Typology Code available for payment source | Admitting: Internal Medicine

## 2018-09-16 ENCOUNTER — Ambulatory Visit (INDEPENDENT_AMBULATORY_CARE_PROVIDER_SITE_OTHER)
Admission: RE | Admit: 2018-09-16 | Discharge: 2018-09-16 | Disposition: A | Discharge status: Home / Self Care | Payer: No Typology Code available for payment source | Source: Ambulatory Visit | Attending: Internal Medicine | Admitting: Internal Medicine

## 2018-09-16 ENCOUNTER — Ambulatory Visit: Payer: No Typology Code available for payment source

## 2018-09-16 DIAGNOSIS — R7611 Nonspecific reaction to tuberculin skin test without active tuberculosis: Secondary | ICD-10-CM

## 2018-09-16 LAB — TB SKIN TEST
Induration: 5 mm
TB SKIN TEST: POSITIVE

## 2018-09-16 LAB — POCT URINE PREGNANCY: Preg Test, Ur: NEGATIVE

## 2018-09-16 NOTE — Addendum Note (Signed)
Addended by: Ellamae Sia on: 09/16/2018 04:09 PM   Modules accepted: Orders

## 2018-09-18 LAB — VITAMIN D 25 HYDROXY (VIT D DEFICIENCY, FRACTURES): Vit D, 25-Hydroxy: 16 ng/mL — ABNORMAL LOW (ref 30–100)

## 2018-09-18 LAB — MEASLES/MUMPS/RUBELLA IMMUNITY
MUMPS IGG: 269 [AU]/ml
RUBELLA: 4.15 {index}
Rubeola IgG: 289 AU/mL

## 2018-09-18 LAB — HEPATITIS B SURFACE ANTIBODY, QUANTITATIVE: Hepatitis B-Post: 149 m[IU]/mL (ref >=10)

## 2018-09-18 LAB — VARICELLA ZOSTER ANTIBODY, IGG: Varicella IgG: 1590 index

## 2018-09-21 ENCOUNTER — Ambulatory Visit: Payer: No Typology Code available for payment source | Admitting: Psychology

## 2018-09-21 DIAGNOSIS — F332 Major depressive disorder, recurrent severe without psychotic features: Secondary | ICD-10-CM | POA: Diagnosis not present

## 2018-09-26 DIAGNOSIS — Z9289 Personal history of other medical treatment: Secondary | ICD-10-CM

## 2018-09-26 HISTORY — DX: Personal history of other medical treatment: Z92.89

## 2018-10-27 ENCOUNTER — Telehealth: Payer: Self-pay | Admitting: Internal Medicine

## 2018-10-27 NOTE — Telephone Encounter (Signed)
ERROR

## 2018-10-27 NOTE — Telephone Encounter (Signed)
Done, given back to Robin 

## 2018-10-27 NOTE — Telephone Encounter (Signed)
Pt dropped off FMLA paperwork In [employer] in box for review and signature

## 2018-10-27 NOTE — Telephone Encounter (Signed)
Paperwork faxed °

## 2018-10-28 NOTE — Telephone Encounter (Signed)
Pt aware °Copy for scan °Copy for pt °

## 2018-12-16 ENCOUNTER — Telehealth: Payer: Self-pay | Admitting: Internal Medicine

## 2018-12-16 DIAGNOSIS — Z0279 Encounter for issue of other medical certificate: Secondary | ICD-10-CM

## 2018-12-16 NOTE — Telephone Encounter (Signed)
Done, given back to Robin 

## 2018-12-16 NOTE — Telephone Encounter (Signed)
Pt dropped off FMLA paperwork stating it was the same as last fmla in dec.  This just has a different case number  In [facility] in box for review and signature

## 2018-12-19 NOTE — Telephone Encounter (Signed)
Paperwork faxed °

## 2018-12-20 NOTE — Telephone Encounter (Signed)
Pt aware °Copy for pt °Copy for scan °Copy for billing °

## 2019-01-05 ENCOUNTER — Ambulatory Visit: Payer: No Typology Code available for payment source | Admitting: Psychology

## 2019-01-05 DIAGNOSIS — F332 Major depressive disorder, recurrent severe without psychotic features: Secondary | ICD-10-CM

## 2019-01-13 ENCOUNTER — Ambulatory Visit: Payer: No Typology Code available for payment source | Admitting: Psychology

## 2019-01-13 DIAGNOSIS — F332 Major depressive disorder, recurrent severe without psychotic features: Secondary | ICD-10-CM | POA: Diagnosis not present

## 2019-01-31 ENCOUNTER — Encounter: Payer: Self-pay | Admitting: Internal Medicine

## 2019-02-03 ENCOUNTER — Ambulatory Visit: Payer: No Typology Code available for payment source | Admitting: Psychology

## 2019-02-09 ENCOUNTER — Encounter: Payer: Self-pay | Admitting: Internal Medicine

## 2019-02-10 ENCOUNTER — Other Ambulatory Visit: Payer: Self-pay

## 2019-02-10 ENCOUNTER — Encounter: Payer: Self-pay | Admitting: Internal Medicine

## 2019-02-10 ENCOUNTER — Ambulatory Visit: Payer: No Typology Code available for payment source | Admitting: Internal Medicine

## 2019-02-10 VITALS — BP 106/70 | HR 92 | Temp 98.2°F | Wt 113.0 lb

## 2019-02-10 DIAGNOSIS — F419 Anxiety disorder, unspecified: Secondary | ICD-10-CM

## 2019-02-10 DIAGNOSIS — R0789 Other chest pain: Secondary | ICD-10-CM | POA: Diagnosis not present

## 2019-02-10 DIAGNOSIS — M25512 Pain in left shoulder: Secondary | ICD-10-CM

## 2019-02-10 NOTE — Progress Notes (Signed)
Subjective:    Patient ID: Kristen Ball, female    DOB: 1991/01/29, 29 y.o.   MRN: [id number]  HPI  Pt presents to the clinic today with c/o left sided chest pain and left shoulder pain. This started 2 weeks ago.The majority of the pain is in the left shoulder. She describes the pain as achy. It is worse with taking a deep breath. It is not worse with movement. She denies numbness, tingling or weakness in the left arm. She denies any injury to the area. She has tried Ibuprofen and Flexeril with minimal relief.   She reports the left sided chest pain is intermittent. She reports the pain is sharp and stabbing. The pain does not radiate. She denies associated chest tightness or shortness of breath. 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 She reports she hates cleaning the bathrooms. The pain subsided when she was able to sit down and rest.  Review of Systems  Past Medical History:  Diagnosis Date  . Asthma   . Chicken pox   . 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 Depression   . Migraines     Current Outpatient Medications  Medication Sig Dispense Refill  . albuterol (PROVENTIL HFA;VENTOLIN HFA) 108 (90 BASE) MCG/ACT inhaler Inhale 2 puffs into the lungs every 6 (six) hours as needed for wheezing or shortness of breath. 1 Inhaler 0  . cyclobenzaprine (FLEXERIL) 10 MG tablet     . SUMAtriptan (IMITREX) 25 MG tablet Take 1 tablet (25 mg total) by mouth every 2 (two) hours as needed for migraine. May repeat in 2 hours if headache persists or recurs. 10 tablet 2  . topiramate (TOPAMAX) 50 MG tablet Take 1 tablet (50 mg total) by mouth daily. 30 tablet 2  . Vitamin D, Ergocalciferol, (DRISDOL) 1.25 MG (50000 UT) CAPS capsule Take 1 capsule (50,000 Units total) by mouth every 7 (seven) days. 12 capsule 0   No current facility-administered medications for this visit.     No Known Allergies  Family History  Problem Relation Age of  Onset  . Arthritis Mother   . Breast cancer Maternal Aunt   . Diabetes Paternal Grandmother   . Hypertension Paternal Grandfather   . Colon cancer Maternal Grandmother   . Migraines Neg Hx     Social History   Socioeconomic History  . Marital status: Single    Spouse name: Not on file  . Number of children: 0  . Years of education: Some coll  . Highest education level: Not on file  Occupational History  . Occupation: Marine scientist  Social Needs  . Financial resource strain: Not on file  . Food insecurity:    Worry: Not on file    Inability: Not on file  . Transportation needs:    Medical: Not on file    Non-medical: Not on file  Tobacco Use  . Smoking status: Current Every Day Smoker    Types: Cigars  . Smokeless tobacco: Never Used  . Tobacco comment: 1 cigar daily  Substance and Sexual Activity  . Alcohol use: Yes    Alcohol/week: 0.0 standard drinks    Comment: moderate  . Drug use: No  . Sexual activity: Yes    Birth control/protection: Implant  Lifestyle  . Physical activity:    Days per week: Not on file    Minutes per session: Not on file  . Stress: Not on file  Relationships  .  Social connections:    Talks on phone: Not on file    Gets together: Not on file    Attends religious service: Not on file    Active member of club or organization: Not on file    Attends meetings of clubs or organizations: Not on file    Relationship status: Not on file  . Intimate partner violence:    Fear of current or ex partner: Not on file    Emotionally abused: Not on file    Physically abused: Not on file    Forced sexual activity: Not on file  Other Topics Concern  . Not on file  Social History Narrative   ** Merged History Encounter **       Work or School: works at post [employer] Situation: lives alone       Spiritual Beliefs: none      Lifestyle: no regular exercising; diet is poor      Right-handed   Rare caffeine use, occasional soda            Constitutional: Denies fever, malaise, fatigue, headache or abrupt weight changes.  HEENT: Denies eye pain, eye redness, ear pain, ringing in the ears, wax buildup, runny nose, nasal congestion, bloody nose, or sore throat. Respiratory: Denies difficulty breathing, shortness of breath, cough or sputum production.   Cardiovascular: Denies chest pain, chest tightness, palpitations or swelling in the hands or feet.  Gastrointestinal: Denies abdominal pain, bloating, constipation, diarrhea or blood in the stool.  GU: Denies urgency, frequency, pain with urination, burning sensation, blood in urine, odor or discharge. Musculoskeletal: Pt reports left chest wall pain, left shoulder pain. Denies decrease in range of motion, difficulty with gait, or joint swelling.  Skin: Denies redness, rashes, lesions or ulcercations.  Neurological: Denies dizziness, difficulty with memory, difficulty with speech or problems with balance and coordination.  Psych: Pt reports anxiety. Denies depression, SI/HI.  No other specific complaints in a complete review of systems (except as listed in HPI above).     Objective:   Physical Exam   BP 106/70   Pulse 92   Temp 98.2 F (36.8 C) (Oral)   Wt 113 lb (51.3 kg)   SpO2 98%   BMI 19.40 kg/m  Wt Readings from Last 3 Encounters:  02/10/19 113 lb (51.3 kg)  08/17/18 116 lb (52.6 kg)  04/25/18 119 lb (54 kg)    General: Appears her stated age, well developed, well nourished in NAD. Skin: Warm, dry and intact. No rashes noted. Cardiovascular: Normal rate and rhythm. S1,S2 noted.  No murmur, rubs or gallops noted.  Pulmonary/Chest: Normal effort and positive vesicular breath sounds. No respiratory distress. No wheezes, rales or ronchi noted.  Musculoskeletal: Normal internal, external rotation of the left shoulder. No pain with palpation of the left shoulder. Pain with palpation of the upper left chest wall, just adjacent to the left shoulder. Strength 5/5 BUE/  Hand grips equal.  Neurological: Alert and oriented. Sensation intact to BLE. Psychiatric: Mildly anxious appearing today.   BMET    Component Value Date/Time   NA 138 07/10/2018 0942   K 3.8 07/10/2018 0942   CL 105 07/10/2018 0942   CO2 27 07/10/2018 0942   GLUCOSE 94 07/10/2018 0942   BUN 8 07/10/2018 0942   CREATININE 0.64 07/10/2018 0942   CALCIUM 8.7 (L) 07/10/2018 0942   GFRNONAA >60 07/10/2018 0942   GFRAA >60 07/10/2018 5573    Lipid Panel  No results found for: CHOL, TRIG, HDL, CHOLHDL, VLDL, LDLCALC  CBC    Component Value Date/Time   WBC 4.7 07/10/2018 0942   RBC 3.88 07/10/2018 0942   HGB 11.4 (L) 07/10/2018 0942   HCT 35.9 (L) 07/10/2018 0942   PLT 188 07/10/2018 0942   MCV 92.5 07/10/2018 0942   MCH 29.4 07/10/2018 0942   MCHC 31.8 07/10/2018 0942   RDW 12.2 07/10/2018 0942   LYMPHSABS 1.7 07/10/2018 0942   MONOABS 0.4 07/10/2018 0942   EOSABS 0.1 07/10/2018 0942   BASOSABS 0.0 07/10/2018 0942    Hgb A1C Lab Results  Component Value Date   HGBA1C 5.5 04/25/2018           Assessment & Plan:   Left Sided Chest Wall Pain, Left Shoulder Pain:  Seems muscular  in origin Shoulder exercises given Encouraged her to continue Ibuprofen and Flexeril Advised her to try Biofreeze as well ? Anxiety component with cleaning bathrooms, advised her to avoid this for now Work note provided  Return precautions discussed [name], NP

## 2019-02-10 NOTE — Patient Instructions (Signed)
Shoulder Exercises Ask your health care provider which exercises are safe for you. Do exercises exactly as told by your health care provider and adjust them as directed. It is normal to feel mild stretching, pulling, tightness, or discomfort as you do these exercises, but you should stop right away if you feel sudden pain or your pain gets worse.Do not begin these exercises until told by your health care provider. Range of Motion Exercises        These exercises warm up your muscles and joints and improve the movement and flexibility of your shoulder. These exercises also help to relieve pain, numbness, and tingling. These exercises involve stretching your injured shoulder directly. Exercise A: Pendulum 1. Stand near a wall or a surface that you can hold onto for balance. 2. Bend at the waist and let your left / right arm hang straight down. Use your other arm to support you. Keep your back straight and do not lock your knees. 3. Relax your left / right arm and shoulder muscles, and move your hips and your trunk so your left / right arm swings freely. Your arm should swing because of the motion of your body, not because you are using your arm or shoulder muscles. 4. Keep moving your body so your arm swings in the following directions, as told by your health care provider: ? Side to side. ? Forward and backward. ? In clockwise and counterclockwise circles. 5. Continue each motion for __________ seconds, or for as long as told by your health care provider. 6. Slowly return to the starting position. Repeat __________ times. Complete this exercise __________ times a day. Exercise B:Flexion, Standing 1. Stand and hold a broomstick, a cane, or a similar object. Place your hands a little more than shoulder-width apart on the object. Your left / right hand should be palm-up, and your other hand should be palm-down. 2. Keep your elbow straight and keep your shoulder muscles relaxed. Push the stick  down with your healthy arm to raise your left / right arm in front of your body, and then over your head until you feel a stretch in your shoulder. ? Avoid shrugging your shoulder while you raise your arm. Keep your shoulder blade tucked down toward the middle of your back. 3. Hold for __________ seconds. 4. Slowly return to the starting position. Repeat __________ times. Complete this exercise __________ times a day. Exercise C: Abduction, Standing 1. Stand and hold a broomstick, a cane, or a similar object. Place your hands a little more than shoulder-width apart on the object. Your left / right hand should be palm-up, and your other hand should be palm-down. 2. While keeping your elbow straight and your shoulder muscles relaxed, push the stick across your body toward your left / right side. Raise your left / right arm to the side of your body and then over your head until you feel a stretch in your shoulder. ? Do not raise your arm above shoulder height, unless your health care provider tells you to do that. ? Avoid shrugging your shoulder while you raise your arm. Keep your shoulder blade tucked down toward the middle of your back. 3. Hold for __________ seconds. 4. Slowly return to the starting position. Repeat __________ times. Complete this exercise __________ times a day. Exercise D:Internal Rotation 1. Place your left / right hand behind your back, palm-up. 2. Use your other hand to dangle an exercise band, a towel, or a similar object over your shoulder.   Grasp the band with your left / right hand so you are holding onto both ends. 3. Gently pull up on the band until you feel a stretch in the front of your left / right shoulder. ? Avoid shrugging your shoulder while you raise your arm. Keep your shoulder blade tucked down toward the middle of your back. 4. Hold for __________ seconds. 5. Release the stretch by letting go of the band and lowering your hands. Repeat __________ times.  Complete this exercise __________ times a day. Stretching Exercises  These exercises warm up your muscles and joints and improve the movement and flexibility of your shoulder. These exercises also help to relieve pain, numbness, and tingling. These exercises are done using your healthy shoulder to help stretch the muscles of your injured shoulder. Exercise E: Corner Stretch (External Rotation and Abduction) 1. Stand in a doorway with one of your feet slightly in front of the other. This is called a staggered stance. If you cannot reach your forearms to the door frame, stand facing a corner of a room. 2. Choose one of the following positions as told by your health care provider: ? Place your hands and forearms on the door frame above your head. ? Place your hands and forearms on the door frame at the height of your head. ? Place your hands on the door frame at the height of your elbows. 3. Slowly move your weight onto your front foot until you feel a stretch across your chest and in the front of your shoulders. Keep your head and chest upright and keep your abdominal muscles tight. 4. Hold for __________ seconds. 5. To release the stretch, shift your weight to your back foot. Repeat __________ times. Complete this stretch __________ times a day. Exercise F:Extension, Standing 1. Stand and hold a broomstick, a cane, or a similar object behind your back. ? Your hands should be a little wider than shoulder-width apart. ? Your palms should face away from your back. 2. Keeping your elbows straight and keeping your shoulder muscles relaxed, move the stick away from your body until you feel a stretch in your shoulder. ? Avoid shrugging your shoulders while you move the stick. Keep your shoulder blade tucked down toward the middle of your back. 3. Hold for __________ seconds. 4. Slowly return to the starting position. Repeat __________ times. Complete this exercise __________ times a  day. Strengthening Exercises           These exercises build strength and endurance in your shoulder. Endurance is the ability to use your muscles for a long time, even after they get tired. Exercise G:External Rotation 1. Sit in a stable chair without armrests. 2. Secure an exercise band at elbow height on your left / right side. 3. Place a soft object, such as a folded towel or a small pillow, between your left / right upper arm and your body to move your elbow a few inches away (about 10 cm) from your side. 4. Hold the end of the band so it is tight and there is no slack. 5. Keeping your elbow pressed against the soft object, move your left / right forearm out, away from your abdomen. Keep your body steady so only your forearm moves. 6. Hold for __________ seconds. 7. Slowly return to the starting position. Repeat __________ times. Complete this exercise __________ times a day. Exercise H:Shoulder Abduction 1. Sit in a stable chair without armrests, or stand. 2. Hold a __________ weight in your   left / right hand, or hold an exercise band with both hands. 3. Start with your arms straight down and your left / right palm facing in, toward your body. 4. Slowly lift your left / right hand out to your side. Do not lift your hand above shoulder height unless your health care provider tells you that this is safe. ? Keep your arms straight. ? Avoid shrugging your shoulder while you do this movement. Keep your shoulder blade tucked down toward the middle of your back. 5. Hold for __________ seconds. 6. Slowly lower your arm, and return to the starting position. Repeat __________ times. Complete this exercise __________ times a day. Exercise I:Shoulder Extension 1. Sit in a stable chair without armrests, or stand. 2. Secure an exercise band to a stable object in front of you where it is at shoulder height. 3. Hold one end of the exercise band in each hand. Your palms should face each  other. 4. Straighten your elbows and lift your hands up to shoulder height. 5. Step back, away from the secured end of the exercise band, until the band is tight and there is no slack. 6. Squeeze your shoulder blades together as you pull your hands down to the sides of your thighs. Stop when your hands are straight down by your sides. Do not let your hands go behind your body. 7. Hold for __________ seconds. 8. Slowly return to the starting position. Repeat __________ times. Complete this exercise __________ times a day. Exercise J:Standing Shoulder Row 1. Sit in a stable chair without armrests, or stand. 2. Secure an exercise band to a stable object in front of you so it is at waist height. 3. Hold one end of the exercise band in each hand. Your palms should be in a thumbs-up position. 4. Bend each of your elbows to an "L" shape (about 90 degrees) and keep your upper arms at your sides. 5. Step back until the band is tight and there is no slack. 6. Slowly pull your elbows back behind you. 7. Hold for __________ seconds. 8. Slowly return to the starting position. Repeat __________ times. Complete this exercise __________ times a day. Exercise K:Shoulder Press-Ups 1. Sit in a stable chair that has armrests. Sit upright, with your feet flat on the floor. 2. Put your hands on the armrests so your elbows are bent and your fingers are pointing forward. Your hands should be about even with the sides of your body. 3. Push down on the armrests and use your arms to lift yourself off of the chair. Straighten your elbows and lift yourself up as much as you comfortably can. ? Move your shoulder blades down, and avoid letting your shoulders move up toward your ears. ? Keep your feet on the ground. As you get stronger, your feet should support less of your body weight as you lift yourself up. 4. Hold for __________ seconds. 5. Slowly lower yourself back into the chair. Repeat __________ times. Complete  this exercise __________ times a day. Exercise L: Wall Push-Ups 1. Stand so you are facing a stable wall. Your feet should be about one arm-length away from the wall. 2. Lean forward and place your palms on the wall at shoulder height. 3. Keep your feet flat on the floor as you bend your elbows and lean forward toward the wall. 4. Hold for __________ seconds. 5. Straighten your elbows to push yourself back to the starting position. Repeat __________ times. Complete this exercise __________ times   a day. This information is not intended to replace advice given to you by your health care provider. Make sure you discuss any questions you have with your health care provider. Document Released: 09/09/2005 Document Revised: 03/01/2018 Document Reviewed: 07/07/2015 Elsevier Interactive Patient Education  2019 [employer]  

## 2019-02-18 ENCOUNTER — Encounter: Payer: Self-pay | Admitting: Internal Medicine

## 2019-02-23 ENCOUNTER — Ambulatory Visit (INDEPENDENT_AMBULATORY_CARE_PROVIDER_SITE_OTHER): Payer: No Typology Code available for payment source | Admitting: Internal Medicine

## 2019-02-23 ENCOUNTER — Encounter: Payer: Self-pay | Admitting: Internal Medicine

## 2019-02-23 DIAGNOSIS — M25512 Pain in left shoulder: Secondary | ICD-10-CM | POA: Diagnosis not present

## 2019-02-23 MED ORDER — PREDNISONE 10 MG PO TABS: ORAL_TABLET | ORAL | 0 refills | Status: DC

## 2019-02-23 NOTE — Progress Notes (Signed)
Virtual Visit via Video Note  I connected with Kristen Ball on 02/23/19 at 10:15 AM EDT by a video enabled telemedicine application and verified that I am speaking with the correct person using two identifiers.   I discussed the limitations of evaluation and management by telemedicine and the availability of in person appointments. The patient expressed understanding and agreed to proceed.  Patient Location: Home Provider Location: Office History of Present Illness:  Pt reports persistent left shoulder pain and left sided chest wall pain. Was seen 4/3 for the same. She describes the pain as achy. It is worse with taking a deep breath and now worse with taking certain movements. She denies any injury to the area. She denies numbness, tingling or weakness. She has been having some anxiety but reports this is stable. She denies chest heaviness, chest tightness or shortness of breath. She was prescribed Ibuprofen and Flexeril with minimal relief. She has used Biofreeze with some relief.   Observations/Objective:  Alert and oriented NAD Normal internal and external rotation of the left shoulder No obvious swelling or deformity Mildly anxious appearing Judgment and thought content are normal  Assessment and Plan:  Acute Pain of Left Shoulder:  Will trial Pred Taper x 9 days- avoid NSAID's Shoulder exercises given Continue Biofreeze Will obtain xray of left shoulder- see will schedule lab only appt for this Consider PT if no improvement   Follow Up Instructions:    I discussed the assessment and treatment plan with the patient. The patient was provided an opportunity to ask questions and all were answered. The patient agreed with the plan and demonstrated an understanding of the instructions.   The patient was advised to call back or seek an in-person evaluation if the symptoms worsen or if the condition fails to improve as anticipated.     [name], NP

## 2019-02-23 NOTE — Patient Instructions (Signed)
Shoulder Exercises Ask your health care provider which exercises are safe for you. Do exercises exactly as told by your health care provider and adjust them as directed. It is normal to feel mild stretching, pulling, tightness, or discomfort as you do these exercises, but you should stop right away if you feel sudden pain or your pain gets worse.Do not begin these exercises until told by your health care provider. Range of Motion Exercises        These exercises warm up your muscles and joints and improve the movement and flexibility of your shoulder. These exercises also help to relieve pain, numbness, and tingling. These exercises involve stretching your injured shoulder directly. Exercise A: Pendulum 1. Stand near a wall or a surface that you can hold onto for balance. 2. Bend at the waist and let your left / right arm hang straight down. Use your other arm to support you. Keep your back straight and do not lock your knees. 3. Relax your left / right arm and shoulder muscles, and move your hips and your trunk so your left / right arm swings freely. Your arm should swing because of the motion of your body, not because you are using your arm or shoulder muscles. 4. Keep moving your body so your arm swings in the following directions, as told by your health care provider: ? Side to side. ? Forward and backward. ? In clockwise and counterclockwise circles. 5. Continue each motion for __________ seconds, or for as long as told by your health care provider. 6. Slowly return to the starting position. Repeat __________ times. Complete this exercise __________ times a day. Exercise B:Flexion, Standing 1. Stand and hold a broomstick, a cane, or a similar object. Place your hands a little more than shoulder-width apart on the object. Your left / right hand should be palm-up, and your other hand should be palm-down. 2. Keep your elbow straight and keep your shoulder muscles relaxed. Push the stick  down with your healthy arm to raise your left / right arm in front of your body, and then over your head until you feel a stretch in your shoulder. ? Avoid shrugging your shoulder while you raise your arm. Keep your shoulder blade tucked down toward the middle of your back. 3. Hold for __________ seconds. 4. Slowly return to the starting position. Repeat __________ times. Complete this exercise __________ times a day. Exercise C: Abduction, Standing 1. Stand and hold a broomstick, a cane, or a similar object. Place your hands a little more than shoulder-width apart on the object. Your left / right hand should be palm-up, and your other hand should be palm-down. 2. While keeping your elbow straight and your shoulder muscles relaxed, push the stick across your body toward your left / right side. Raise your left / right arm to the side of your body and then over your head until you feel a stretch in your shoulder. ? Do not raise your arm above shoulder height, unless your health care provider tells you to do that. ? Avoid shrugging your shoulder while you raise your arm. Keep your shoulder blade tucked down toward the middle of your back. 3. Hold for __________ seconds. 4. Slowly return to the starting position. Repeat __________ times. Complete this exercise __________ times a day. Exercise D:Internal Rotation 1. Place your left / right hand behind your back, palm-up. 2. Use your other hand to dangle an exercise band, a towel, or a similar object over your shoulder.   Grasp the band with your left / right hand so you are holding onto both ends. 3. Gently pull up on the band until you feel a stretch in the front of your left / right shoulder. ? Avoid shrugging your shoulder while you raise your arm. Keep your shoulder blade tucked down toward the middle of your back. 4. Hold for __________ seconds. 5. Release the stretch by letting go of the band and lowering your hands. Repeat __________ times.  Complete this exercise __________ times a day. Stretching Exercises  These exercises warm up your muscles and joints and improve the movement and flexibility of your shoulder. These exercises also help to relieve pain, numbness, and tingling. These exercises are done using your healthy shoulder to help stretch the muscles of your injured shoulder. Exercise E: Corner Stretch (External Rotation and Abduction) 1. Stand in a doorway with one of your feet slightly in front of the other. This is called a staggered stance. If you cannot reach your forearms to the door frame, stand facing a corner of a room. 2. Choose one of the following positions as told by your health care provider: ? Place your hands and forearms on the door frame above your head. ? Place your hands and forearms on the door frame at the height of your head. ? Place your hands on the door frame at the height of your elbows. 3. Slowly move your weight onto your front foot until you feel a stretch across your chest and in the front of your shoulders. Keep your head and chest upright and keep your abdominal muscles tight. 4. Hold for __________ seconds. 5. To release the stretch, shift your weight to your back foot. Repeat __________ times. Complete this stretch __________ times a day. Exercise F:Extension, Standing 1. Stand and hold a broomstick, a cane, or a similar object behind your back. ? Your hands should be a little wider than shoulder-width apart. ? Your palms should face away from your back. 2. Keeping your elbows straight and keeping your shoulder muscles relaxed, move the stick away from your body until you feel a stretch in your shoulder. ? Avoid shrugging your shoulders while you move the stick. Keep your shoulder blade tucked down toward the middle of your back. 3. Hold for __________ seconds. 4. Slowly return to the starting position. Repeat __________ times. Complete this exercise __________ times a  day. Strengthening Exercises           These exercises build strength and endurance in your shoulder. Endurance is the ability to use your muscles for a long time, even after they get tired. Exercise G:External Rotation 1. Sit in a stable chair without armrests. 2. Secure an exercise band at elbow height on your left / right side. 3. Place a soft object, such as a folded towel or a small pillow, between your left / right upper arm and your body to move your elbow a few inches away (about 10 cm) from your side. 4. Hold the end of the band so it is tight and there is no slack. 5. Keeping your elbow pressed against the soft object, move your left / right forearm out, away from your abdomen. Keep your body steady so only your forearm moves. 6. Hold for __________ seconds. 7. Slowly return to the starting position. Repeat __________ times. Complete this exercise __________ times a day. Exercise H:Shoulder Abduction 1. Sit in a stable chair without armrests, or stand. 2. Hold a __________ weight in your   left / right hand, or hold an exercise band with both hands. 3. Start with your arms straight down and your left / right palm facing in, toward your body. 4. Slowly lift your left / right hand out to your side. Do not lift your hand above shoulder height unless your health care provider tells you that this is safe. ? Keep your arms straight. ? Avoid shrugging your shoulder while you do this movement. Keep your shoulder blade tucked down toward the middle of your back. 5. Hold for __________ seconds. 6. Slowly lower your arm, and return to the starting position. Repeat __________ times. Complete this exercise __________ times a day. Exercise I:Shoulder Extension 1. Sit in a stable chair without armrests, or stand. 2. Secure an exercise band to a stable object in front of you where it is at shoulder height. 3. Hold one end of the exercise band in each hand. Your palms should face each  other. 4. Straighten your elbows and lift your hands up to shoulder height. 5. Step back, away from the secured end of the exercise band, until the band is tight and there is no slack. 6. Squeeze your shoulder blades together as you pull your hands down to the sides of your thighs. Stop when your hands are straight down by your sides. Do not let your hands go behind your body. 7. Hold for __________ seconds. 8. Slowly return to the starting position. Repeat __________ times. Complete this exercise __________ times a day. Exercise J:Standing Shoulder Row 1. Sit in a stable chair without armrests, or stand. 2. Secure an exercise band to a stable object in front of you so it is at waist height. 3. Hold one end of the exercise band in each hand. Your palms should be in a thumbs-up position. 4. Bend each of your elbows to an "L" shape (about 90 degrees) and keep your upper arms at your sides. 5. Step back until the band is tight and there is no slack. 6. Slowly pull your elbows back behind you. 7. Hold for __________ seconds. 8. Slowly return to the starting position. Repeat __________ times. Complete this exercise __________ times a day. Exercise K:Shoulder Press-Ups 1. Sit in a stable chair that has armrests. Sit upright, with your feet flat on the floor. 2. Put your hands on the armrests so your elbows are bent and your fingers are pointing forward. Your hands should be about even with the sides of your body. 3. Push down on the armrests and use your arms to lift yourself off of the chair. Straighten your elbows and lift yourself up as much as you comfortably can. ? Move your shoulder blades down, and avoid letting your shoulders move up toward your ears. ? Keep your feet on the ground. As you get stronger, your feet should support less of your body weight as you lift yourself up. 4. Hold for __________ seconds. 5. Slowly lower yourself back into the chair. Repeat __________ times. Complete  this exercise __________ times a day. Exercise L: Wall Push-Ups 1. Stand so you are facing a stable wall. Your feet should be about one arm-length away from the wall. 2. Lean forward and place your palms on the wall at shoulder height. 3. Keep your feet flat on the floor as you bend your elbows and lean forward toward the wall. 4. Hold for __________ seconds. 5. Straighten your elbows to push yourself back to the starting position. Repeat __________ times. Complete this exercise __________ times   a day. This information is not intended to replace advice given to you by your health care provider. Make sure you discuss any questions you have with your health care provider. Document Released: 09/09/2005 Document Revised: 03/01/2018 Document Reviewed: 07/07/2015 Elsevier Interactive Patient Education  2019 [employer]  

## 2019-03-09 ENCOUNTER — Encounter: Payer: Self-pay | Admitting: Internal Medicine

## 2019-03-13 ENCOUNTER — Encounter: Payer: Self-pay | Admitting: Internal Medicine

## 2019-03-21 ENCOUNTER — Encounter: Payer: Self-pay | Admitting: Internal Medicine

## 2019-03-21 ENCOUNTER — Ambulatory Visit (INDEPENDENT_AMBULATORY_CARE_PROVIDER_SITE_OTHER): Payer: No Typology Code available for payment source | Admitting: Internal Medicine

## 2019-03-21 DIAGNOSIS — G8929 Other chronic pain: Secondary | ICD-10-CM | POA: Diagnosis not present

## 2019-03-21 DIAGNOSIS — R519 Headache, unspecified: Secondary | ICD-10-CM

## 2019-03-21 DIAGNOSIS — M25512 Pain in left shoulder: Secondary | ICD-10-CM

## 2019-03-21 DIAGNOSIS — R51 Headache: Secondary | ICD-10-CM | POA: Diagnosis not present

## 2019-03-21 MED ORDER — TOPIRAMATE 50 MG PO TABS: 50.0000 mg | ORAL_TABLET | Freq: Every day | ORAL | 5 refills | Status: DC

## 2019-03-21 MED ORDER — SUMATRIPTAN SUCCINATE 25 MG PO TABS: 25.0000 mg | ORAL_TABLET | ORAL | 2 refills | Status: DC | PRN

## 2019-03-21 NOTE — Patient Instructions (Signed)
Shoulder Exercises Ask your health care provider which exercises are safe for you. Do exercises exactly as told by your health care provider and adjust them as directed. It is normal to feel mild stretching, pulling, tightness, or discomfort as you do these exercises, but you should stop right away if you feel sudden pain or your pain gets worse.Do not begin these exercises until told by your health care provider. Range of Motion Exercises        These exercises warm up your muscles and joints and improve the movement and flexibility of your shoulder. These exercises also help to relieve pain, numbness, and tingling. These exercises involve stretching your injured shoulder directly. Exercise A: Pendulum 1. Stand near a wall or a surface that you can hold onto for balance. 2. Bend at the waist and let your left / right arm hang straight down. Use your other arm to support you. Keep your back straight and do not lock your knees. 3. Relax your left / right arm and shoulder muscles, and move your hips and your trunk so your left / right arm swings freely. Your arm should swing because of the motion of your body, not because you are using your arm or shoulder muscles. 4. Keep moving your body so your arm swings in the following directions, as told by your health care provider: ? Side to side. ? Forward and backward. ? In clockwise and counterclockwise circles. 5. Continue each motion for __________ seconds, or for as long as told by your health care provider. 6. Slowly return to the starting position. Repeat __________ times. Complete this exercise __________ times a day. Exercise B:Flexion, Standing 1. Stand and hold a broomstick, a cane, or a similar object. Place your hands a little more than shoulder-width apart on the object. Your left / right hand should be palm-up, and your other hand should be palm-down. 2. Keep your elbow straight and keep your shoulder muscles relaxed. Push the stick  down with your healthy arm to raise your left / right arm in front of your body, and then over your head until you feel a stretch in your shoulder. ? Avoid shrugging your shoulder while you raise your arm. Keep your shoulder blade tucked down toward the middle of your back. 3. Hold for __________ seconds. 4. Slowly return to the starting position. Repeat __________ times. Complete this exercise __________ times a day. Exercise C: Abduction, Standing 1. Stand and hold a broomstick, a cane, or a similar object. Place your hands a little more than shoulder-width apart on the object. Your left / right hand should be palm-up, and your other hand should be palm-down. 2. While keeping your elbow straight and your shoulder muscles relaxed, push the stick across your body toward your left / right side. Raise your left / right arm to the side of your body and then over your head until you feel a stretch in your shoulder. ? Do not raise your arm above shoulder height, unless your health care provider tells you to do that. ? Avoid shrugging your shoulder while you raise your arm. Keep your shoulder blade tucked down toward the middle of your back. 3. Hold for __________ seconds. 4. Slowly return to the starting position. Repeat __________ times. Complete this exercise __________ times a day. Exercise D:Internal Rotation 1. Place your left / right hand behind your back, palm-up. 2. Use your other hand to dangle an exercise band, a towel, or a similar object over your shoulder.   Grasp the band with your left / right hand so you are holding onto both ends. 3. Gently pull up on the band until you feel a stretch in the front of your left / right shoulder. ? Avoid shrugging your shoulder while you raise your arm. Keep your shoulder blade tucked down toward the middle of your back. 4. Hold for __________ seconds. 5. Release the stretch by letting go of the band and lowering your hands. Repeat __________ times.  Complete this exercise __________ times a day. Stretching Exercises  These exercises warm up your muscles and joints and improve the movement and flexibility of your shoulder. These exercises also help to relieve pain, numbness, and tingling. These exercises are done using your healthy shoulder to help stretch the muscles of your injured shoulder. Exercise E: Corner Stretch (External Rotation and Abduction) 1. Stand in a doorway with one of your feet slightly in front of the other. This is called a staggered stance. If you cannot reach your forearms to the door frame, stand facing a corner of a room. 2. Choose one of the following positions as told by your health care provider: ? Place your hands and forearms on the door frame above your head. ? Place your hands and forearms on the door frame at the height of your head. ? Place your hands on the door frame at the height of your elbows. 3. Slowly move your weight onto your front foot until you feel a stretch across your chest and in the front of your shoulders. Keep your head and chest upright and keep your abdominal muscles tight. 4. Hold for __________ seconds. 5. To release the stretch, shift your weight to your back foot. Repeat __________ times. Complete this stretch __________ times a day. Exercise F:Extension, Standing 1. Stand and hold a broomstick, a cane, or a similar object behind your back. ? Your hands should be a little wider than shoulder-width apart. ? Your palms should face away from your back. 2. Keeping your elbows straight and keeping your shoulder muscles relaxed, move the stick away from your body until you feel a stretch in your shoulder. ? Avoid shrugging your shoulders while you move the stick. Keep your shoulder blade tucked down toward the middle of your back. 3. Hold for __________ seconds. 4. Slowly return to the starting position. Repeat __________ times. Complete this exercise __________ times a  day. Strengthening Exercises           These exercises build strength and endurance in your shoulder. Endurance is the ability to use your muscles for a long time, even after they get tired. Exercise G:External Rotation 1. Sit in a stable chair without armrests. 2. Secure an exercise band at elbow height on your left / right side. 3. Place a soft object, such as a folded towel or a small pillow, between your left / right upper arm and your body to move your elbow a few inches away (about 10 cm) from your side. 4. Hold the end of the band so it is tight and there is no slack. 5. Keeping your elbow pressed against the soft object, move your left / right forearm out, away from your abdomen. Keep your body steady so only your forearm moves. 6. Hold for __________ seconds. 7. Slowly return to the starting position. Repeat __________ times. Complete this exercise __________ times a day. Exercise H:Shoulder Abduction 1. Sit in a stable chair without armrests, or stand. 2. Hold a __________ weight in your   left / right hand, or hold an exercise band with both hands. 3. Start with your arms straight down and your left / right palm facing in, toward your body. 4. Slowly lift your left / right hand out to your side. Do not lift your hand above shoulder height unless your health care provider tells you that this is safe. ? Keep your arms straight. ? Avoid shrugging your shoulder while you do this movement. Keep your shoulder blade tucked down toward the middle of your back. 5. Hold for __________ seconds. 6. Slowly lower your arm, and return to the starting position. Repeat __________ times. Complete this exercise __________ times a day. Exercise I:Shoulder Extension 1. Sit in a stable chair without armrests, or stand. 2. Secure an exercise band to a stable object in front of you where it is at shoulder height. 3. Hold one end of the exercise band in each hand. Your palms should face each  other. 4. Straighten your elbows and lift your hands up to shoulder height. 5. Step back, away from the secured end of the exercise band, until the band is tight and there is no slack. 6. Squeeze your shoulder blades together as you pull your hands down to the sides of your thighs. Stop when your hands are straight down by your sides. Do not let your hands go behind your body. 7. Hold for __________ seconds. 8. Slowly return to the starting position. Repeat __________ times. Complete this exercise __________ times a day. Exercise J:Standing Shoulder Row 1. Sit in a stable chair without armrests, or stand. 2. Secure an exercise band to a stable object in front of you so it is at waist height. 3. Hold one end of the exercise band in each hand. Your palms should be in a thumbs-up position. 4. Bend each of your elbows to an "L" shape (about 90 degrees) and keep your upper arms at your sides. 5. Step back until the band is tight and there is no slack. 6. Slowly pull your elbows back behind you. 7. Hold for __________ seconds. 8. Slowly return to the starting position. Repeat __________ times. Complete this exercise __________ times a day. Exercise K:Shoulder Press-Ups 1. Sit in a stable chair that has armrests. Sit upright, with your feet flat on the floor. 2. Put your hands on the armrests so your elbows are bent and your fingers are pointing forward. Your hands should be about even with the sides of your body. 3. Push down on the armrests and use your arms to lift yourself off of the chair. Straighten your elbows and lift yourself up as much as you comfortably can. ? Move your shoulder blades down, and avoid letting your shoulders move up toward your ears. ? Keep your feet on the ground. As you get stronger, your feet should support less of your body weight as you lift yourself up. 4. Hold for __________ seconds. 5. Slowly lower yourself back into the chair. Repeat __________ times. Complete  this exercise __________ times a day. Exercise L: Wall Push-Ups 1. Stand so you are facing a stable wall. Your feet should be about one arm-length away from the wall. 2. Lean forward and place your palms on the wall at shoulder height. 3. Keep your feet flat on the floor as you bend your elbows and lean forward toward the wall. 4. Hold for __________ seconds. 5. Straighten your elbows to push yourself back to the starting position. Repeat __________ times. Complete this exercise __________ times   a day. This information is not intended to replace advice given to you by your health care provider. Make sure you discuss any questions you have with your health care provider. Document Released: 09/09/2005 Document Revised: 03/01/2018 Document Reviewed: 07/07/2015 Elsevier Interactive Patient Education  2019 [employer]  

## 2019-03-21 NOTE — Progress Notes (Signed)
Virtual Visit via Video Note  I connected with Kristen Ball on 03/21/19 at  2:00 PM EDT by a video enabled telemedicine application and verified that I am speaking with the correct person using two identifiers.  Location: Patient: Home Provider: Office   I discussed the limitations of evaluation and management by telemedicine and the availability of in person appointments. The patient expressed understanding and agreed to proceed.  History of Present Illness:  Pt wants to follow up on headaches. She reports these are starting to occur daily again. They are located all over her head. She describes the pain as throbbing. She reports associated sensitivity to light and nausea. She denies dizziness, visual changes, sensitivity to sound or vomiting. She is prescribed Topamax and Imitrex as needed for breakthrough, but she is not taking these medications because she ran out of them and never refilled them. She does report increase in stress, which is a trigger for her headaches.  She also c/o left shoulder pain. This has been an ongoing issue. She was seen for the same 4/3 and 4/16. She has failed NSAID's, muscle relaxers and Prednisone. She reports the pain is worse when she is working. She has not had a chance to get the xray done but reports she will get it done this week.    Past Medical History:  Diagnosis Date  . Asthma   . Chicken pox   . 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 Depression   . Migraines     Current Outpatient Medications  Medication Sig Dispense Refill  . predniSONE (DELTASONE) 10 MG tablet Take 3 tabs on days 1-3, take 2 tabs on days 4-6, take 1 tab on days 7-9 18 tablet 0  . SUMAtriptan (IMITREX) 25 MG tablet Take 1 tablet (25 mg total) by mouth every 2 (two) hours as needed for migraine. May repeat in 2 hours if headache persists or recurs. (Patient not taking: Reported on 02/10/2019) 10 tablet 2  . topiramate (TOPAMAX) 50 MG tablet Take 1 tablet (50  mg total) by mouth daily. 30 tablet 2   No current facility-administered medications for this visit.     No Known Allergies  Family History  Problem Relation Age of Onset  . Arthritis Mother   . Breast cancer Maternal Aunt   . Diabetes Paternal Grandmother   . Hypertension Paternal Grandfather   . Colon cancer Maternal Grandmother   . Migraines Neg Hx     Social History   Socioeconomic History  . Marital status: Single    Spouse name: Not on file  . Number of children: 0  . Years of education: Some coll  . Highest education level: Not on file  Occupational History  . Occupation: Marine scientist  Social Needs  . Financial resource strain: Not on file  . Food insecurity:    Worry: Not on file    Inability: Not on file  . Transportation needs:    Medical: Not on file    Non-medical: Not on file  Tobacco Use  . Smoking status: Current Every Day Smoker    Types: Cigars  . Smokeless tobacco: Never Used  . Tobacco comment: 1 cigar daily  Substance and Sexual Activity  . Alcohol use: Yes    Alcohol/week: 0.0 standard drinks    Comment: moderate  . Drug use: No  . Sexual activity: Yes    Birth control/protection: Implant  Lifestyle  . Physical activity:    Days per week: Not on file  Minutes per session: Not on file  . Stress: Not on file  Relationships  . Social connections:    Talks on phone: Not on file    Gets together: Not on file    Attends religious service: Not on file    Active member of club or organization: Not on file    Attends meetings of clubs or organizations: Not on file    Relationship status: Not on file  . Intimate partner violence:    Fear of current or ex partner: Not on file    Emotionally abused: Not on file    Physically abused: Not on file    Forced sexual activity: Not on file  Other Topics Concern  . Not on file  Social History Narrative   ** Merged History Encounter **       Work or School: works at post [employer]  Situation: lives alone       Spiritual Beliefs: none      Lifestyle: no regular exercising; diet is poor      Right-handed   Rare caffeine use, occasional soda           Constitutional: Pt reports headaches. Denies fever, malaise, fatigue, or abrupt weight changes.  HEENT: Pt reports sensitivity to light. Denies eye pain, eye redness, ear pain, ringing in the ears, wax buildup, runny nose, nasal congestion, bloody nose, or sore throat. Respiratory: Denies difficulty breathing, shortness of breath, cough or sputum production.   Cardiovascular: Denies chest pain, chest tightness, palpitations or swelling in the hands or feet.  Gastrointestinal: Denies abdominal pain, bloating, constipation, diarrhea or blood in the stool.  GU: Denies urgency, frequency, pain with urination, burning sensation, blood in urine, odor or discharge. Musculoskeletal: Pt reports left shoulder pain. Denies decrease in range of motion, difficulty with gait, muscle pain or joint swelling.  Skin: Denies redness, rashes, lesions or ulcercations.  Neurological: Denies dizziness, difficulty with memory, difficulty with speech or problems with balance and coordination.  Psych: Denies anxiety, depression, SI/HI.  No other specific complaints in a complete review of systems (except as listed in HPI above).   Wt Readings from Last 3 Encounters:  02/10/19 113 lb (51.3 kg)  08/17/18 116 lb (52.6 kg)  04/25/18 119 lb (54 kg)    General: Appears her stated age, well developed, well nourished in NAD. Pulmonary/Chest: Normal effort. No respiratory distress.  Musculoskeletal: Normal internal and external rotation of the left shoulder. No obvious deformity noted. Neurological: Alert and oriented. Cranial nerves II-XII grossly intact. Coordination normal.  Psychiatric: Mood and affect normal. Behavior is normal. Judgment and thought content normal.     BMET    Component Value Date/Time   NA 138 07/10/2018 0942   K 3.8  07/10/2018 0942   CL 105 07/10/2018 0942   CO2 27 07/10/2018 0942   GLUCOSE 94 07/10/2018 0942   BUN 8 07/10/2018 0942   CREATININE 0.64 07/10/2018 0942   CALCIUM 8.7 (L) 07/10/2018 0942   GFRNONAA >60 07/10/2018 0942   GFRAA >60 07/10/2018 0942    Lipid Panel  No results found for: CHOL, TRIG, HDL, CHOLHDL, VLDL, LDLCALC  CBC    Component Value Date/Time   WBC 4.7 07/10/2018 0942   RBC 3.88 07/10/2018 0942   HGB 11.4 (L) 07/10/2018 0942   HCT 35.9 (L) 07/10/2018 0942   PLT 188 07/10/2018 0942   MCV 92.5 07/10/2018 0942   MCH 29.4 07/10/2018 0942   MCHC 31.8  07/10/2018 0942   RDW 12.2 07/10/2018 0942   LYMPHSABS 1.7 07/10/2018 0942   MONOABS 0.4 07/10/2018 0942   EOSABS 0.1 07/10/2018 0942   BASOSABS 0.0 07/10/2018 0942    Hgb A1C Lab Results  Component Value Date   HGBA1C 5.5 04/25/2018        Assessment and Plan:  Frequent Headaches:  Will refill Topamax and Imitrex Discussed stress relieving techniques Encouraged adequate hydration as dehydration can lead to headaches  Left Shoulder Pain:  Encouraged stretching, ROM exercise Advised her to get the xray ASAP Ibuprofen as needed Work note provided  Update me in 1 month and let me know how headaches are doing, I will follow up with you after the xray results are back.  Follow Up Instructions:    I discussed the assessment and treatment plan with the patient. The patient was provided an opportunity to ask questions and all were answered. The patient agreed with the plan and demonstrated an understanding of the instructions.   The patient was advised to call back or seek an in-person evaluation if the symptoms worsen or if the condition fails to improve as anticipated.    [name], NP

## 2019-03-23 ENCOUNTER — Ambulatory Visit (INDEPENDENT_AMBULATORY_CARE_PROVIDER_SITE_OTHER)
Admission: RE | Admit: 2019-03-23 | Discharge: 2019-03-23 | Disposition: A | Discharge status: Home / Self Care | Payer: No Typology Code available for payment source | Source: Ambulatory Visit | Attending: Internal Medicine | Admitting: Internal Medicine

## 2019-03-23 ENCOUNTER — Other Ambulatory Visit: Payer: Self-pay

## 2019-03-23 DIAGNOSIS — M25512 Pain in left shoulder: Secondary | ICD-10-CM | POA: Diagnosis not present

## 2019-03-24 ENCOUNTER — Encounter: Payer: Self-pay | Admitting: Internal Medicine

## 2019-03-24 DIAGNOSIS — M25512 Pain in left shoulder: Secondary | ICD-10-CM

## 2019-03-24 DIAGNOSIS — G8929 Other chronic pain: Secondary | ICD-10-CM

## 2019-04-12 ENCOUNTER — Encounter: Payer: Self-pay | Admitting: Internal Medicine

## 2019-04-13 MED ORDER — DICLOFENAC SODIUM 75 MG PO TBEC: 75.0000 mg | DELAYED_RELEASE_TABLET | Freq: Two times a day (BID) | ORAL | 2 refills | Status: DC

## 2019-06-07 ENCOUNTER — Encounter: Payer: Self-pay | Admitting: Internal Medicine

## 2019-06-08 ENCOUNTER — Ambulatory Visit (INDEPENDENT_AMBULATORY_CARE_PROVIDER_SITE_OTHER): Payer: No Typology Code available for payment source | Admitting: Internal Medicine

## 2019-06-08 ENCOUNTER — Encounter: Payer: Self-pay | Admitting: Internal Medicine

## 2019-06-08 DIAGNOSIS — F32A Depression, unspecified: Secondary | ICD-10-CM

## 2019-06-08 DIAGNOSIS — F419 Anxiety disorder, unspecified: Secondary | ICD-10-CM | POA: Diagnosis not present

## 2019-06-08 DIAGNOSIS — Z566 Other physical and mental strain related to work: Secondary | ICD-10-CM | POA: Diagnosis not present

## 2019-06-08 DIAGNOSIS — F329 Major depressive disorder, single episode, unspecified: Secondary | ICD-10-CM

## 2019-06-08 MED ORDER — VENLAFAXINE HCL ER 37.5 MG PO CP24: 37.5000 mg | ORAL_CAPSULE | Freq: Every day | ORAL | 2 refills | Status: DC

## 2019-06-08 NOTE — Patient Instructions (Signed)

## 2019-06-08 NOTE — Assessment & Plan Note (Signed)
Deteriorated GAD 7 and PHQ 9 completed today Support offered today Encouraged her to continue therapy Will trial Effexor, eRx sent to pharmacy Letter provided for LOA  Advised her to follow up with me in 1 month to let me know how she is doing

## 2019-06-08 NOTE — Progress Notes (Signed)
Virtual Visit via Video Note  I connected with Kristen Ball on 06/08/19 at  3:45 PM EDT by a video enabled telemedicine application and verified that I am speaking with the correct person using two identifiers.  Location: Patient: Home Provider: Office   I discussed the limitations of evaluation and management by telemedicine and the availability of in person appointments. The patient expressed understanding and agreed to proceed.  History of Present Illness:   Pt wanting to follow up anxiety and depression. She feels like this has been really bad recently. She feels like most of her issues are triggered by stress at work. She reports she dreads going into work. She has used up all of her FMLA days already. She reports she has poor appetite, trouble sleeping. She is requesting a leave of abscense and would like to get restarted on meds for anxiety and depression. She has taken Lexapro and Sertraline in the past but did not like the way it made her feel. She is seeing a therapist. She denies SI/HI.   Past Medical History:  Diagnosis Date  . Asthma   . Chicken pox   . 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 Depression   . Migraines     Current Outpatient Medications  Medication Sig Dispense Refill  . diclofenac (VOLTAREN) 75 MG EC tablet Take 1 tablet (75 mg total) by mouth 2 (two) times daily. 60 tablet 2  . SUMAtriptan (IMITREX) 25 MG tablet Take 1 tablet (25 mg total) by mouth every 2 (two) hours as needed for migraine. May repeat in 2 hours if headache persists or recurs. 10 tablet 2  . topiramate (TOPAMAX) 50 MG tablet Take 1 tablet (50 mg total) by mouth daily. 30 tablet 5  . venlafaxine XR (EFFEXOR-XR) 37.5 MG 24 hr capsule Take 1 capsule (37.5 mg total) by mouth daily with breakfast. 30 capsule 2   No current facility-administered medications for this visit.     No Known Allergies  Family History  Problem Relation Age of Onset  . Arthritis Mother   .  Breast cancer Maternal Aunt   . Diabetes Paternal Grandmother   . Hypertension Paternal Grandfather   . Colon cancer Maternal Grandmother   . Migraines Neg Hx     Social History   Socioeconomic History  . Marital status: Single    Spouse name: Not on file  . Number of children: 0  . Years of education: Some coll  . Highest education level: Not on file  Occupational History  . Occupation: Marine scientist  Social Needs  . Financial resource strain: Not on file  . Food insecurity    Worry: Not on file    Inability: Not on file  . Transportation needs    Medical: Not on file    Non-medical: Not on file  Tobacco Use  . Smoking status: Current Every Day Smoker    Types: Cigars  . Smokeless tobacco: Never Used  . Tobacco comment: 1 cigar daily  Substance and Sexual Activity  . Alcohol use: Yes    Alcohol/week: 0.0 standard drinks    Comment: moderate  . Drug use: No  . Sexual activity: Yes    Birth control/protection: Implant  Lifestyle  . Physical activity    Days per week: Not on file    Minutes per session: Not on file  . Stress: Not on file  Relationships  . Social connections    Talks on phone: Not on file  Gets together: Not on file    Attends religious service: Not on file    Active member of club or organization: Not on file    Attends meetings of clubs or organizations: Not on file    Relationship status: Not on file  . Intimate partner violence    Fear of current or ex partner: Not on file    Emotionally abused: Not on file    Physically abused: Not on file    Forced sexual activity: Not on file  Other Topics Concern  . Not on file  Social History Narrative   ** Merged History Encounter **       Work or School: works at post [employer] Situation: lives alone       Spiritual Beliefs: none      Lifestyle: no regular exercising; diet is poor      Right-handed   Rare caffeine use, occasional soda           Constitutional: Pt reports  fatigue. Denies fever, malaise, headache or abrupt weight changes.  Respiratory: Denies difficulty breathing, shortness of breath, cough or sputum production.   Cardiovascular: Denies chest pain, chest tightness, palpitations or swelling in the hands or feet.  Gastrointestinal: Pt reports poor appetite. Denies abdominal pain, bloating, constipation, diarrhea or blood in the stool.  Neurological: Pt reports insomnia. Denies dizziness, difficulty with memory, difficulty with speech or problems with balance and coordination.  Psych: Pt reports anxiety and depression. Denies SI/HI.  No other specific complaints in a complete review of systems (except as listed in HPI above).  Observations/Objective:  Wt Readings from Last 3 Encounters:  02/10/19 113 lb (51.3 kg)  08/17/18 116 lb (52.6 kg)  04/25/18 119 lb (54 kg)    General: Appears her stated age, well developed, well nourished in NAD. Pulmonary/Chest: Normal effort. No respiratory distress.  Neurological: Alert and oriented.  Psychiatric: Anxious appearing, fidgety.  BMET    Component Value Date/Time   NA 138 07/10/2018 0942   K 3.8 07/10/2018 0942   CL 105 07/10/2018 0942   CO2 27 07/10/2018 0942   GLUCOSE 94 07/10/2018 0942   BUN 8 07/10/2018 0942   CREATININE 0.64 07/10/2018 0942   CALCIUM 8.7 (L) 07/10/2018 0942   GFRNONAA >60 07/10/2018 0942   GFRAA >60 07/10/2018 0942    Lipid Panel  No results found for: CHOL, TRIG, HDL, CHOLHDL, VLDL, LDLCALC  CBC    Component Value Date/Time   WBC 4.7 07/10/2018 0942   RBC 3.88 07/10/2018 0942   HGB 11.4 (L) 07/10/2018 0942   HCT 35.9 (L) 07/10/2018 0942   PLT 188 07/10/2018 0942   MCV 92.5 07/10/2018 0942   MCH 29.4 07/10/2018 0942   MCHC 31.8 07/10/2018 0942   RDW 12.2 07/10/2018 0942   LYMPHSABS 1.7 07/10/2018 0942   MONOABS 0.4 07/10/2018 0942   EOSABS 0.1 07/10/2018 0942   BASOSABS 0.0 07/10/2018 0942    Hgb A1C Lab Results  Component Value Date   HGBA1C 5.5  04/25/2018        Assessment and Plan:  See problem based charting.   Follow Up Instructions:    I discussed the assessment and treatment plan with the patient. The patient was provided an opportunity to ask questions and all were answered. The patient agreed with the plan and demonstrated an understanding of the instructions.   The patient was advised to call back or seek an in-person evaluation if the symptoms  worsen or if the condition fails to improve as anticipated.    [name], NP ;

## 2019-06-15 ENCOUNTER — Telehealth: Payer: Self-pay | Admitting: Internal Medicine

## 2019-06-15 NOTE — Telephone Encounter (Signed)
Best number [phone]  Pt called wanting to get a work note for 06/08/2019 to be out of work for 6 months

## 2019-06-16 NOTE — Telephone Encounter (Signed)
Patient requested her work note be put on my chart. Patient needs to give the note to work by 06/18/19.

## 2019-06-16 NOTE — Telephone Encounter (Signed)
I thought we already did this, but ok for work note. We discussed this at recent virtual visit.

## 2019-06-20 ENCOUNTER — Encounter: Payer: Self-pay | Admitting: Internal Medicine

## 2019-06-21 ENCOUNTER — Ambulatory Visit: Payer: No Typology Code available for payment source | Admitting: Psychology

## 2019-08-12 ENCOUNTER — Encounter: Payer: Self-pay | Admitting: Internal Medicine

## 2019-12-07 ENCOUNTER — Encounter: Payer: Self-pay | Admitting: Internal Medicine

## 2019-12-19 ENCOUNTER — Encounter: Payer: Self-pay | Admitting: Internal Medicine

## 2020-01-08 ENCOUNTER — Encounter: Payer: Self-pay | Admitting: Internal Medicine

## 2020-01-16 ENCOUNTER — Ambulatory Visit (INDEPENDENT_AMBULATORY_CARE_PROVIDER_SITE_OTHER): Payer: No Typology Code available for payment source | Admitting: Psychology

## 2020-01-16 DIAGNOSIS — F332 Major depressive disorder, recurrent severe without psychotic features: Secondary | ICD-10-CM | POA: Diagnosis not present

## 2020-01-18 ENCOUNTER — Other Ambulatory Visit: Payer: Self-pay

## 2020-01-18 ENCOUNTER — Encounter: Payer: Self-pay | Admitting: Internal Medicine

## 2020-01-18 ENCOUNTER — Ambulatory Visit: Payer: No Typology Code available for payment source | Admitting: Internal Medicine

## 2020-01-18 VITALS — BP 106/68 | HR 79 | Temp 97.6°F | Wt 112.0 lb

## 2020-01-18 DIAGNOSIS — K921 Melena: Secondary | ICD-10-CM

## 2020-01-18 DIAGNOSIS — H6121 Impacted cerumen, right ear: Secondary | ICD-10-CM

## 2020-01-18 NOTE — Patient Instructions (Signed)
Rectal Bleeding  Rectal bleeding is when blood comes out of the opening of the butt (anus). People with this kind of bleeding may notice bright red blood in their underwear or in the toilet after they poop (have a bowel movement). They may also have dark red or black poop (stool). Rectal bleeding is often a sign that something is wrong. It needs to be checked by a doctor. Follow these instructions at home: Watch for any changes in your condition. Take these actions to help with bleeding and discomfort:  Eat a diet that is high in fiber. This will keep your poop soft so it is easier for you to poop without pushing too hard. Ask your doctor to tell you what foods and drinks are high in fiber.  Drink enough fluid to keep your pee (urine) clear or pale yellow. This also helps keep your poop soft.  Try taking a warm bath. This may help with pain.  Keep all follow-up visits as told by your doctor. This is important. Get help right away if:  You have new bleeding.  You have more bleeding than before.  You have black or dark red poop.  You throw up (vomit) blood or something that looks like coffee grounds.  You have pain or tenderness in your belly (abdomen).  You have a fever.  You feel weak.  You feel sick to your stomach (nauseous).  You pass out (faint).  You have very bad pain in your butt.  You cannot poop. This information is not intended to replace advice given to you by your health care provider. Make sure you discuss any questions you have with your health care provider. Document Revised: 10/08/2017 Document Reviewed: 12/22/2015 Elsevier Patient Education  2020 Elsevier Inc.  

## 2020-01-18 NOTE — Progress Notes (Signed)
Subjective:    Patient ID: Kristen Ball, female    DOB: 04/22/1991, 29 y.o.   MRN: [id number]  HPI  Pt presents to the clinic today with c/o blood in her stool. She noticed this 10 days ago. She has had one episode of nausea and vomiting that has since resolved. She has noticed some abdominal cramping but denies pain, reflux, constipation or diarrhea. She denies recent changes in diet or medication. She has no family history of ulcerative colitis, crohn's or colon cancer.  She also c/o right ear irritation. This started a few days ago. She denies pain, runny nose, nasal congestion or sore throat. She reports associated pain behind her right eye but denies visual changes or dizziness. She has not tried anything OTC for this.  Review of Systems      Past Medical History:  Diagnosis Date  . Asthma   . Chicken pox   . 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 Depression   . Migraines     Current Outpatient Medications  Medication Sig Dispense Refill  . diclofenac (VOLTAREN) 75 MG EC tablet Take 1 tablet (75 mg total) by mouth 2 (two) times daily. 60 tablet 2  . SUMAtriptan (IMITREX) 25 MG tablet Take 1 tablet (25 mg total) by mouth every 2 (two) hours as needed for migraine. May repeat in 2 hours if headache persists or recurs. 10 tablet 2  . topiramate (TOPAMAX) 50 MG tablet Take 1 tablet (50 mg total) by mouth daily. 30 tablet 5  . venlafaxine XR (EFFEXOR-XR) 37.5 MG 24 hr capsule Take 1 capsule (37.5 mg total) by mouth daily with breakfast. 30 capsule 2   No current facility-administered medications for this visit.    No Known Allergies  Family History  Problem Relation Age of Onset  . Arthritis Mother   . Breast cancer Maternal Aunt   . Diabetes Paternal Grandmother   . Hypertension Paternal Grandfather   . Colon cancer Maternal Grandmother   . Migraines Neg Hx     Social History   Socioeconomic History  . Marital status: Single    Spouse name: Not on  file  . Number of children: 0  . Years of education: Some coll  . Highest education level: Not on file  Occupational History  . Occupation: Post office  Tobacco Use  . Smoking status: Current Every Day Smoker    Types: Cigars  . Smokeless tobacco: Never Used  . Tobacco comment: 1 cigar daily  Substance and Sexual Activity  . Alcohol use: Yes    Alcohol/week: 0.0 standard drinks    Comment: moderate  . Drug use: No  . Sexual activity: Yes    Birth control/protection: Implant  Other Topics Concern  . Not on file  Social History Narrative   ** Merged History Encounter **       Work or School: works at post [employer] Situation: lives alone       Spiritual Beliefs: none      Lifestyle: no regular exercising; diet is poor      Right-handed   Rare caffeine use, occasional soda         Social Determinants of Radio broadcast assistant Strain:   . Difficulty of Paying Living Expenses:   Food Insecurity:   . Worried About Charity fundraiser in the Last Year:   . Arboriculturist in the Last Year:   Transportation Needs:   .  Lack of Transportation (Medical):   Marland Kitchen Lack of Transportation (Non-Medical):   Physical Activity:   . Days of Exercise per Week:   . Minutes of Exercise per Session:   Stress:   . Feeling of Stress :   Social Connections:   . Frequency of Communication with Friends and Family:   . Frequency of Social Gatherings with Friends and Family:   . Attends Religious Services:   . Active Member of Clubs or Organizations:   . Attends Archivist Meetings:   Marland Kitchen Marital Status:   Intimate Partner Violence:   . Fear of Current or Ex-Partner:   . Emotionally Abused:   Marland Kitchen Physically Abused:   . Sexually Abused:      Constitutional: Denies fever, malaise, fatigue, headache or abrupt weight changes.  HEENT: Pt reports right ear irritation. Denies eye pain, eye redness, ear pain, ringing in the ears, wax buildup, runny nose, nasal congestion,  bloody nose, or sore throat. Respiratory: Denies difficulty breathing, shortness of breath, cough or sputum production.   Cardiovascular: Denies chest pain, chest tightness, palpitations or swelling in the hands or feet.  Gastrointestinal: Pt reports abdominal cramping, blood in stool. Denies abdominal pain, bloating, constipation, diarrhea.  GU: Denies urgency, frequency, pain with urination, burning sensation, blood in urine, odor or vaginal discharge.  No other specific complaints in a complete review of systems (except as listed in HPI above).  Objective:   Physical Exam  BP 106/68   Pulse 79   Temp 97.6 F (36.4 C) (Temporal)   Wt 112 lb (50.8 kg)   LMP 01/13/2020   SpO2 98%   BMI 19.22 kg/m   Wt Readings from Last 3 Encounters:  02/10/19 113 lb (51.3 kg)  08/17/18 116 lb (52.6 kg)  04/25/18 119 lb (54 kg)    General: Appears her stated age, well developed, well nourished in NAD. HEENT: Head: normal shape and size;  Ears: wax buildup noted in right ear canal; Cardiovascular: Normal rate and rhythm. S1,S2 noted.  No murmur, rubs or gallops noted.  Pulmonary/Chest: Normal effort and positive vesicular breath sounds. No respiratory distress. No wheezes, rales or ronchi noted.  Abdomen: Soft and mildly tender in the epigastric and suprapubic region. Normal bowel sounds. No distention or masses noted. Liver, spleen and kidneys non palpable. Rectal:  No external hemorrhoid, fissure noted. Normal rectal tone. Hemoccult negative. Neurological: Alert and oriented.     BMET    Component Value Date/Time   NA 138 07/10/2018 0942   K 3.8 07/10/2018 0942   CL 105 07/10/2018 0942   CO2 27 07/10/2018 0942   GLUCOSE 94 07/10/2018 0942   BUN 8 07/10/2018 0942   CREATININE 0.64 07/10/2018 0942   CALCIUM 8.7 (L) 07/10/2018 0942   GFRNONAA >60 07/10/2018 0942   GFRAA >60 07/10/2018 0942    Lipid Panel  No results found for: CHOL, TRIG, HDL, CHOLHDL, VLDL, LDLCALC  CBC      Component Value Date/Time   WBC 4.7 07/10/2018 0942   RBC 3.88 07/10/2018 0942   HGB 11.4 (L) 07/10/2018 0942   HCT 35.9 (L) 07/10/2018 0942   PLT 188 07/10/2018 0942   MCV 92.5 07/10/2018 0942   MCH 29.4 07/10/2018 0942   MCHC 31.8 07/10/2018 0942   RDW 12.2 07/10/2018 0942   LYMPHSABS 1.7 07/10/2018 0942   MONOABS 0.4 07/10/2018 0942   EOSABS 0.1 07/10/2018 0942   BASOSABS 0.0 07/10/2018 0942    Hgb A1C Lab Results  Component  Value Date   HGBA1C 5.5 04/25/2018            Assessment & Plan:   Wax Buildup, Right Ear:  Manual removal with cerumen spoon by CMA  Blood in Stool:  Exam benign Hemoccult negative She showed me a picture with obvious small amount of blood in the stool She reports she had a CBC done at her job which was normal No further intervention at this time- we will plan to monitor If worse, consider referral to GI for further evaluation  [name], NP This visit occurred during the SARS-CoV-2 public health emergency.  Safety protocols were in place, including screening questions prior to the visit, additional usage of staff PPE, and extensive cleaning of exam room while observing appropriate contact time as indicated for disinfecting solutions.

## 2020-02-01 ENCOUNTER — Ambulatory Visit (INDEPENDENT_AMBULATORY_CARE_PROVIDER_SITE_OTHER): Payer: No Typology Code available for payment source | Admitting: Psychology

## 2020-02-01 DIAGNOSIS — F332 Major depressive disorder, recurrent severe without psychotic features: Secondary | ICD-10-CM | POA: Diagnosis not present

## 2020-02-22 ENCOUNTER — Ambulatory Visit: Payer: No Typology Code available for payment source | Admitting: Psychology

## 2020-03-21 ENCOUNTER — Ambulatory Visit: Payer: No Typology Code available for payment source | Admitting: Psychology

## 2020-05-16 ENCOUNTER — Telehealth: Payer: Self-pay | Admitting: Internal Medicine

## 2020-05-16 NOTE — Telephone Encounter (Signed)
She need a copy of her immunizations and labs. Please call patient when it is ready.

## 2020-05-20 ENCOUNTER — Encounter: Payer: Self-pay | Admitting: Internal Medicine

## 2020-07-22 ENCOUNTER — Other Ambulatory Visit: Payer: Self-pay

## 2020-07-22 ENCOUNTER — Ambulatory Visit (INDEPENDENT_AMBULATORY_CARE_PROVIDER_SITE_OTHER): Payer: BC Managed Care – PPO | Admitting: Internal Medicine

## 2020-07-22 ENCOUNTER — Encounter: Payer: Self-pay | Admitting: Internal Medicine

## 2020-07-22 DIAGNOSIS — R21 Rash and other nonspecific skin eruption: Secondary | ICD-10-CM | POA: Insufficient documentation

## 2020-07-22 NOTE — Patient Instructions (Signed)
Please try daily cetirizine 10mg  or fexofenadine 180mg  till the itching and rash are gone

## 2020-07-22 NOTE — Assessment & Plan Note (Signed)
This does not look like a contact rash--- so not from couch Likely reaction to metronidazole (though has tolerated topical) Already done with it Discussed antihistamines

## 2020-07-22 NOTE — Progress Notes (Signed)
Subjective:    Patient ID: Kristen Ball, female    DOB: 02/01/1991, 29 y.o.   MRN: [id number]  HPI Here due to rash This visit occurred during the SARS-CoV-2 public health emergency.  Safety protocols were in place, including screening questions prior to the visit, additional usage of staff PPE, and extensive cleaning of exam room while observing appropriate contact time as indicated for disinfecting solutions.   She thinks she is having an allergic reaction to something Started a week ago  Notices bumps all over her body--trunk, legs, hands Not clearly on face  She is itchy The bumps started after starting metronidazole Seems to be worse after sitting on couch (?dog brought something in on it) Usually sleeps on couch----but then went to bed (not clearly better)  No Rx  Current Outpatient Medications on File Prior to Visit  Medication Sig Dispense Refill  . SUMAtriptan (IMITREX) 25 MG tablet Take 1 tablet (25 mg total) by mouth every 2 (two) hours as needed for migraine. May repeat in 2 hours if headache persists or recurs. (Patient not taking: Reported on 07/22/2020) 10 tablet 2  . topiramate (TOPAMAX) 50 MG tablet Take 1 tablet (50 mg total) by mouth daily. (Patient not taking: Reported on 07/22/2020) 30 tablet 5   No current facility-administered medications on file prior to visit.    No Known Allergies  Past Medical History:  Diagnosis Date  . Asthma   . Chicken pox   . 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 Depression   . Migraines     Past Surgical History:  Procedure Laterality Date  . BREAST EXCISIONAL BIOPSY Left 07/2015   benign  . DILATION AND CURETTAGE OF UTERUS  2011  . DILATION AND CURETTAGE OF UTERUS      Family History  Problem Relation Age of Onset  . Arthritis Mother   . Breast cancer Maternal Aunt   . Diabetes Paternal Grandmother   . Hypertension Paternal Grandfather   . Colon cancer Maternal Grandmother   . Migraines Neg Hx      Social History   Socioeconomic History  . Marital status: Single    Spouse name: Not on file  . Number of children: 0  . Years of education: Some coll  . Highest education level: Not on file  Occupational History  . Occupation: Post office  Tobacco Use  . Smoking status: Current Every Day Smoker    Types: Cigars  . Smokeless tobacco: Never Used  . Tobacco comment: 1 cigar daily  Substance and Sexual Activity  . Alcohol use: Yes    Alcohol/week: 0.0 standard drinks    Comment: moderate  . Drug use: No  . Sexual activity: Yes    Birth control/protection: Implant  Other Topics Concern  . Not on file  Social History Narrative   ** Merged History Encounter **       Work or School: works at post [employer] Situation: lives alone       Spiritual Beliefs: none      Lifestyle: no regular exercising; diet is poor      Right-handed   Rare caffeine use, occasional soda         Social Determinants of Radio broadcast assistant Strain:   . Difficulty of Paying Living Expenses: Not on file  Food Insecurity:   . Worried About Charity fundraiser in the Last Year: Not on file  . Ran Out of  Food in the Last Year: Not on file  Transportation Needs:   . Lack of Transportation (Medical): Not on file  . Lack of Transportation (Non-Medical): Not on file  Physical Activity:   . Days of Exercise per Week: Not on file  . Minutes of Exercise per Session: Not on file  Stress:   . Feeling of Stress : Not on file  Social Connections:   . Frequency of Communication with Friends and Family: Not on file  . Frequency of Social Gatherings with Friends and Family: Not on file  . Attends Religious Services: Not on file  . Active Member of Clubs or Organizations: Not on file  . Attends Archivist Meetings: Not on file  . Marital Status: Not on file  Intimate Partner Violence:   . Fear of Current or Ex-Partner: Not on file  . Emotionally Abused: Not on file  .  Physically Abused: Not on file  . 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  Skin:    Comments: Pinpoint papules on neck---some on legs.  Not as much on trunk/arms  Neurological:     Mental Status: She is alert.            Assessment & Plan:

## 2020-07-27 IMAGING — DX DG CHEST 2V
2 series · 2 of 2 positions shown · non-contrast
Comparison: 11/11/2017

CLINICAL DATA: Peripheral edema

EXAM:
CHEST - 2 VIEW

[chest lat]
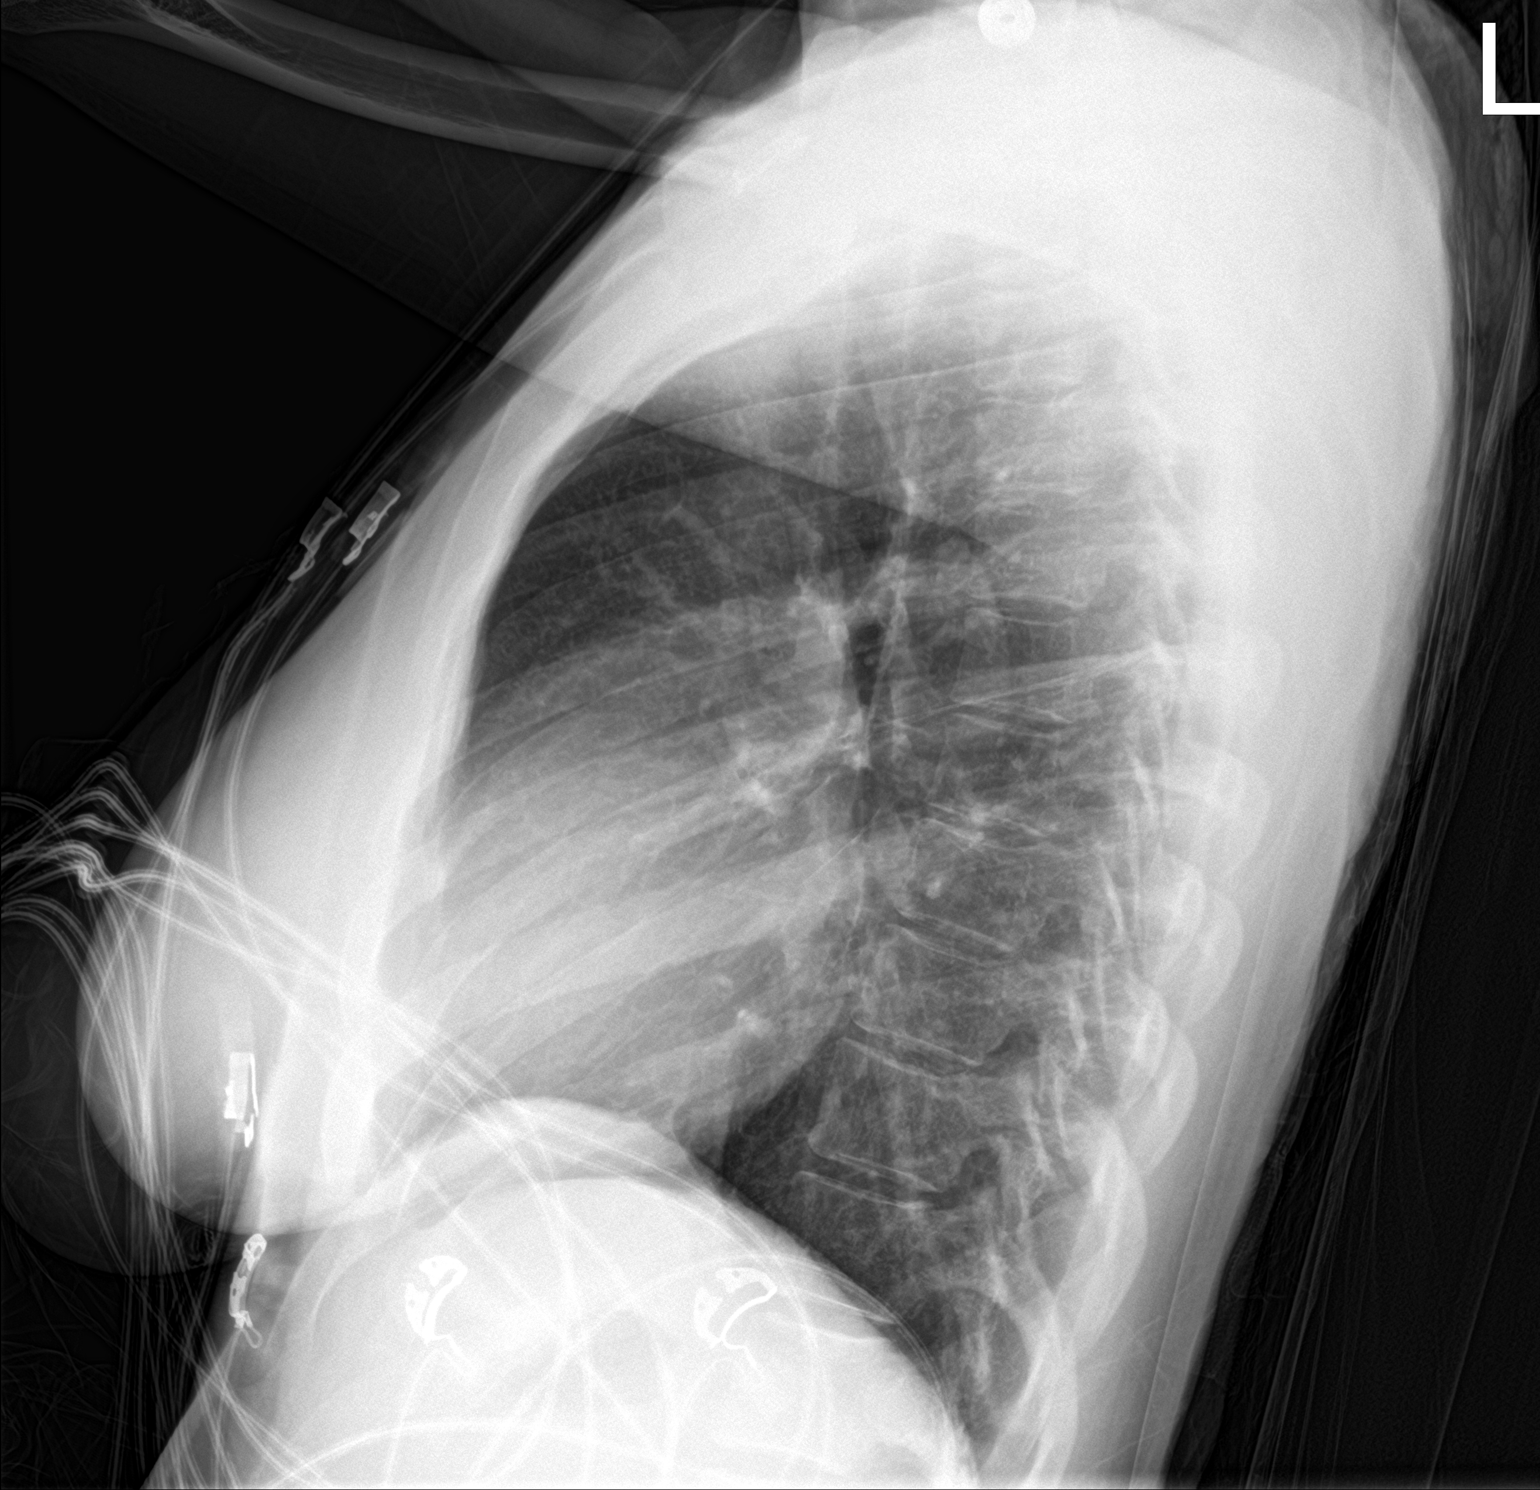

[chest ap]
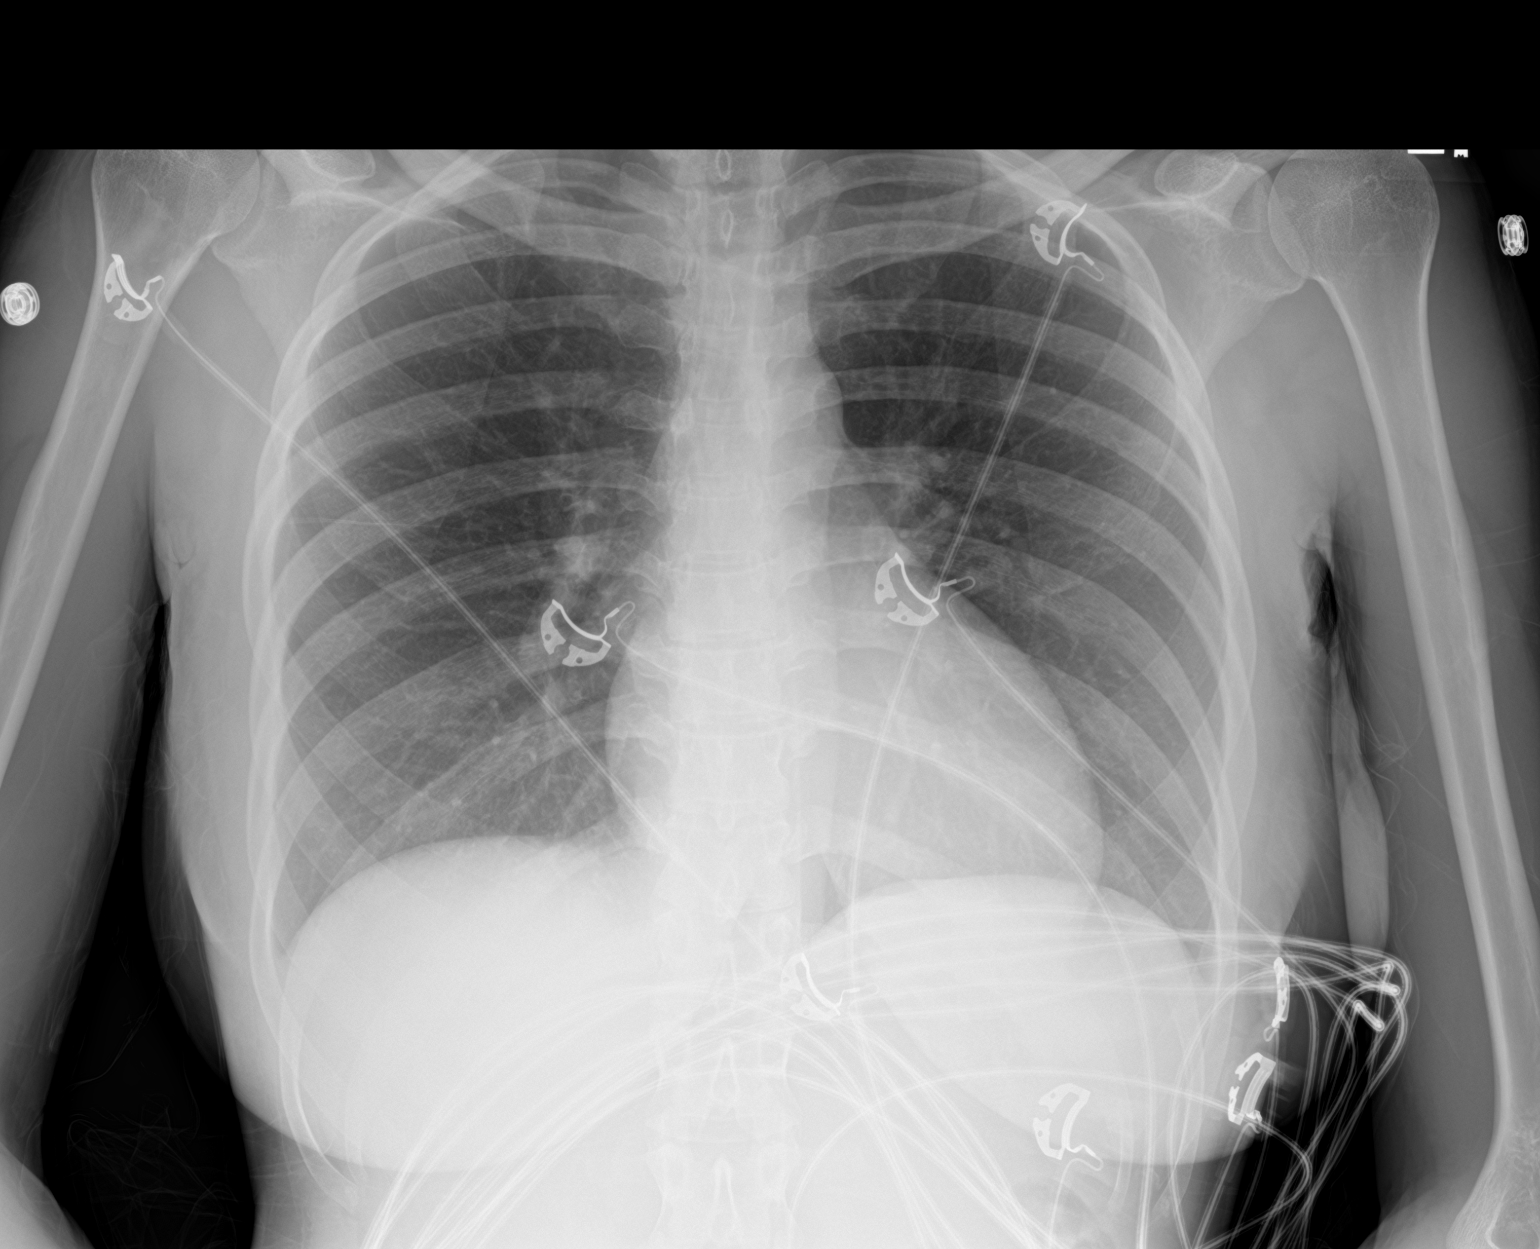

[2 of 2 positions shown; findings below may reference images not displayed]

FINDINGS: The heart size and mediastinal contours are within normal limits.
Both lungs are clear. The visualized skeletal structures are
unremarkable.
IMPRESSION: No active cardiopulmonary disease.

## 2020-10-01 ENCOUNTER — Encounter: Payer: Self-pay | Admitting: Internal Medicine

## 2020-10-01 ENCOUNTER — Ambulatory Visit (INDEPENDENT_AMBULATORY_CARE_PROVIDER_SITE_OTHER): Payer: BC Managed Care – PPO | Admitting: Internal Medicine

## 2020-10-01 ENCOUNTER — Ambulatory Visit (INDEPENDENT_AMBULATORY_CARE_PROVIDER_SITE_OTHER)
Admission: RE | Admit: 2020-10-01 | Discharge: 2020-10-01 | Disposition: A | Discharge status: Home / Self Care | Payer: BC Managed Care – PPO | Source: Ambulatory Visit | Attending: Internal Medicine | Admitting: Internal Medicine

## 2020-10-01 ENCOUNTER — Other Ambulatory Visit: Payer: Self-pay

## 2020-10-01 ENCOUNTER — Ambulatory Visit
Admission: RE | Admit: 2020-10-01 | Discharge: 2020-10-01 | Disposition: A | Discharge status: Home / Self Care | Payer: BC Managed Care – PPO | Source: Ambulatory Visit | Attending: Internal Medicine | Admitting: Internal Medicine

## 2020-10-01 VITALS — BP 106/68 | HR 80 | Temp 98.1°F | Wt 107.0 lb

## 2020-10-01 DIAGNOSIS — M79642 Pain in left hand: Secondary | ICD-10-CM | POA: Diagnosis not present

## 2020-10-01 DIAGNOSIS — M79641 Pain in right hand: Secondary | ICD-10-CM

## 2020-10-01 NOTE — Progress Notes (Signed)
Subjective:    Patient ID: Kristen Ball, female    DOB: 11-Apr-1991, 29 y.o.   MRN: [id number]  HPI  Patient presents the clinic today with complaint of joint pain in her fingers.  She had initially noticed this about 1 month ago.  She describes the pain as stiff, achy. The pain is worse in the morning but also gets worse throughout the day when she works. She denies numbness or tingling but has had some swelling. She has not taken any medications OTC for this. She had a negative autoimmune workup in 2018.  Review of Systems  Past Medical History:  Diagnosis Date  . Asthma   . Chicken pox   . 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 Depression   . Migraines     Current Outpatient Medications  Medication Sig Dispense Refill  . SUMAtriptan (IMITREX) 25 MG tablet Take 1 tablet (25 mg total) by mouth every 2 (two) hours as needed for migraine. May repeat in 2 hours if headache persists or recurs. (Patient not taking: Reported on 07/22/2020) 10 tablet 2  . topiramate (TOPAMAX) 50 MG tablet Take 1 tablet (50 mg total) by mouth daily. (Patient not taking: Reported on 07/22/2020) 30 tablet 5   No current facility-administered medications for this visit.    No Known Allergies  Family History  Problem Relation Age of Onset  . Arthritis Mother   . Breast cancer Maternal Aunt   . Diabetes Paternal Grandmother   . Hypertension Paternal Grandfather   . Colon cancer Maternal Grandmother   . Migraines Neg Hx     Social History   Socioeconomic History  . Marital status: Single    Spouse name: Not on file  . Number of children: 0  . Years of education: Some coll  . Highest education level: Not on file  Occupational History  . Occupation: Post office  Tobacco Use  . Smoking status: Current Every Day Smoker    Types: Cigars  . Smokeless tobacco: Never Used  . Tobacco comment: 1 cigar daily  Substance and Sexual Activity  . Alcohol use: Yes    Alcohol/week: 0.0  standard drinks    Comment: moderate  . Drug use: No  . Sexual activity: Yes    Birth control/protection: Implant  Other Topics Concern  . Not on file  Social History Narrative   ** Merged History Encounter **       Work or School: works at post [employer] Situation: lives alone       Spiritual Beliefs: none      Lifestyle: no regular exercising; diet is poor      Right-handed   Rare caffeine use, occasional soda         Social Determinants of Radio broadcast assistant Strain:   . Difficulty of Paying Living Expenses: Not on file  Food Insecurity:   . Worried About Charity fundraiser in the Last Year: Not on file  . Ran Out of Food in the Last Year: Not on file  Transportation Needs:   . Lack of Transportation (Medical): Not on file  . Lack of Transportation (Non-Medical): Not on file  Physical Activity:   . Days of Exercise per Week: Not on file  . Minutes of Exercise per Session: Not on file  Stress:   . Feeling of Stress : Not on file  Social Connections:   . Frequency of Communication with Friends and Family:  Not on file  . Frequency of Social Gatherings with Friends and Family: Not on file  . Attends Religious Services: Not on file  . Active Member of Clubs or Organizations: Not on file  . Attends Archivist Meetings: Not on file  . Marital Status: Not on file  Intimate Partner Violence:   . Fear of Current or Ex-Partner: Not on file  . Emotionally Abused: Not on file  . Physically Abused: Not on file  . Sexually Abused: Not on file     Constitutional: Denies fever, malaise, fatigue, headache or abrupt weight changes.  Respiratory: Denies difficulty breathing, shortness of breath, cough or sputum production.   Cardiovascular: Denies chest pain, chest tightness, palpitations or swelling in the hands or feet.  Musculoskeletal: Patient reports joint pain and swelling in hands.  Denies decrease in range of motion, difficulty with gait,  muscle pain.  Skin: Denies redness, rashes, lesions or ulcercations.  Neurological: Denies numbness or tingling of BUE.    No other specific complaints in a complete review of systems (except as listed in HPI above).     Objective:   Physical Exam  BP 106/68   Pulse 80   Temp 98.1 F (36.7 C) (Temporal)   Wt 107 lb (48.5 kg)   LMP 09/25/2020   BMI 18.37 kg/m   Wt Readings from Last 3 Encounters:  07/22/20 103 lb (46.7 kg)  01/18/20 112 lb (50.8 kg)  02/10/19 113 lb (51.3 kg)    General: Appears her stated age, well developed, well nourished in NAD. Skin: Warm, dry and intact. No rashes noted. Cardiovascular: Normal rate and rhythm. S1,S2 noted.  No murmur, rubs or gallops noted. Radial pulses 2+ bilaterally. Pulmonary/Chest: Normal effort and positive vesicular breath sounds. No respiratory distress. No wheezes, rales or ronchi noted.  Musculoskeletal: Normal flexion, extension and rotation of the wrist. Normal flexion and extension of the fingers. No joint swelling noted. Hand grips equal. Neurological: Alert and oriented. Sensation intact to BUE.   BMET    Component Value Date/Time   NA 138 07/10/2018 0942   K 3.8 07/10/2018 0942   CL 105 07/10/2018 0942   CO2 27 07/10/2018 0942   GLUCOSE 94 07/10/2018 0942   BUN 8 07/10/2018 0942   CREATININE 0.64 07/10/2018 0942   CALCIUM 8.7 (L) 07/10/2018 0942   GFRNONAA >60 07/10/2018 0942   GFRAA >60 07/10/2018 0942    Lipid Panel  No results found for: CHOL, TRIG, HDL, CHOLHDL, VLDL, LDLCALC  CBC    Component Value Date/Time   WBC 4.7 07/10/2018 0942   RBC 3.88 07/10/2018 0942   HGB 11.4 (L) 07/10/2018 0942   HCT 35.9 (L) 07/10/2018 0942   PLT 188 07/10/2018 0942   MCV 92.5 07/10/2018 0942   MCH 29.4 07/10/2018 0942   MCHC 31.8 07/10/2018 0942   RDW 12.2 07/10/2018 0942   LYMPHSABS 1.7 07/10/2018 0942   MONOABS 0.4 07/10/2018 0942   EOSABS 0.1 07/10/2018 0942   BASOSABS 0.0 07/10/2018 0942    Hgb A1C Lab  Results  Component Value Date   HGBA1C 5.5 04/25/2018          Assessment & Plan:   Bilateral Hand Pain:  Will obtain xrays of bilateral hands today No indication to repeat autoimmune workup at this time Advised Aleve once daily in am with food Consider referral to orthopedics pending xray  Will follow up after xray, return precautions discussed [name], NP This visit occurred during the SARS-CoV-2  public health emergency.  Safety protocols were in place, including screening questions prior to the visit, additional usage of staff PPE, and extensive cleaning of exam room while observing appropriate contact time as indicated for disinfecting solutions.

## 2020-10-01 NOTE — Patient Instructions (Signed)
Joint Pain  Joint pain can be caused by many things. It is likely to go away if you follow instructions from your doctor for taking care of yourself at home. Sometimes, you may need more treatment. Follow these instructions at home: Managing pain, stiffness, and swelling   If told, put ice on the painful area. ? Put ice in a plastic bag. ? Place a towel between your skin and the bag. ? Leave the ice on for 20 minutes, 2-3 times a day.  If told, put heat on the painful area. Do this as often as told by your doctor. Use the heat source that your doctor recommends, such as a moist heat pack or a heating pad. ? Place a towel between your skin and the heat source. ? Leave the heat on for 20-30 minutes. ? Take off the heat if your skin gets bright red. This is especially important if you are unable to feel pain, heat, or cold. You may have a greater risk of getting burned.  Move your fingers or toes below the painful joint often. This helps with stiffness and swelling.  If possible, raise (elevate) the painful joint above the level of your heart while you are sitting or lying down. To do this, try putting a few pillows under the painful joint. Activity  Rest the painful joint for as long as told. Do not do things that cause pain or make your pain worse.  Begin exercising or stretching the affected area, as told by your doctor. Ask your doctor what types of exercise are safe for you. If you have an elastic bandage, sling, or splint:  Wear the device as told by your doctor. Take it off only as told by your doctor.  Loosen the device if your fingers or toes below the joint: ? Tingle. ? Lose feeling (get numb). ? Get cold and blue.  Keep the device clean.  Ask your doctor if you should take off the device before bathing. You may need to cover it with a watertight covering when you take a bath or a shower. General instructions  Take over-the-counter and prescription medicines only as told  by your doctor.  Do not use any products that contain nicotine or tobacco. These include cigarettes and e-cigarettes. If you need help quitting, ask your doctor.  Keep all follow-up visits as told by your doctor. This is important. Contact a doctor if:  You have pain that gets worse and does not get better with medicine.  Your joint pain does not get better in 3 days.  You have more bruising or swelling.  You have a fever.  You lose 10 lb (4.5 kg) or more without trying. Get help right away if:  You cannot move the joint.  Your fingers or toes tingle, lose feeling, or get cold and blue.  You have a fever along with a joint that is red, warm, and swollen. Summary  Joint pain can be caused by many things. It often goes away if you follow instructions from your doctor for taking care of yourself at home.  Rest the painful joint for as long as told. Do not do things that cause pain or make your pain worse.  Take over-the-counter and prescription medicines only as told by your doctor. This information is not intended to replace advice given to you by your health care provider. Make sure you discuss any questions you have with your health care provider. Document Revised: 10/08/2017 Document Reviewed: 08/11/2017 Elsevier  Patient Education  [employer].

## 2020-10-02 ENCOUNTER — Other Ambulatory Visit: Payer: Self-pay | Admitting: Internal Medicine

## 2020-10-02 DIAGNOSIS — M79641 Pain in right hand: Secondary | ICD-10-CM

## 2020-10-02 DIAGNOSIS — M79642 Pain in left hand: Secondary | ICD-10-CM

## 2020-10-31 ENCOUNTER — Other Ambulatory Visit: Payer: Self-pay

## 2020-10-31 ENCOUNTER — Encounter: Payer: Self-pay | Admitting: Internal Medicine

## 2020-10-31 ENCOUNTER — Ambulatory Visit (INDEPENDENT_AMBULATORY_CARE_PROVIDER_SITE_OTHER): Payer: BC Managed Care – PPO | Admitting: Internal Medicine

## 2020-10-31 VITALS — BP 112/70 | Temp 97.8°F | Ht 64.0 in | Wt 108.0 lb

## 2020-10-31 DIAGNOSIS — R1013 Epigastric pain: Secondary | ICD-10-CM | POA: Diagnosis not present

## 2020-10-31 DIAGNOSIS — R6881 Early satiety: Secondary | ICD-10-CM | POA: Diagnosis not present

## 2020-10-31 DIAGNOSIS — R140 Abdominal distension (gaseous): Secondary | ICD-10-CM | POA: Diagnosis not present

## 2020-10-31 DIAGNOSIS — R110 Nausea: Secondary | ICD-10-CM | POA: Diagnosis not present

## 2020-10-31 LAB — COMPREHENSIVE METABOLIC PANEL
ALT: 10 U/L (ref 0–35)
AST: 14 U/L (ref 0–37)
Albumin: 4.7 g/dL (ref 3.5–5.2)
Alkaline Phosphatase: 48 U/L (ref 39–117)
BUN: 7 mg/dL (ref 6–23)
CO2: 28 mEq/L (ref 19–32)
Calcium: 9.4 mg/dL (ref 8.4–10.5)
Chloride: 104 mEq/L (ref 96–112)
Creatinine, Ser: 0.72 mg/dL (ref 0.40–1.20)
GFR: 112.32 mL/min (ref >60.00)
Glucose, Bld: 93 mg/dL (ref 70–99)
Potassium: 3.9 mEq/L (ref 3.5–5.1)
Sodium: 138 mEq/L (ref 135–145)
Total Bilirubin: 1 mg/dL (ref 0.2–1.2)
Total Protein: 8 g/dL (ref 6.0–8.3)

## 2020-10-31 LAB — LIPASE: Lipase: 24 U/L (ref 11.0–59.0)

## 2020-10-31 LAB — AMYLASE: Amylase: 75 U/L (ref 27–131)

## 2020-10-31 LAB — H. PYLORI ANTIBODY, IGG: H Pylori IgG: NEGATIVE

## 2020-10-31 NOTE — Progress Notes (Signed)
Subjective:    Patient ID: Kristen Ball, female    DOB: [birth year], 29 y.o.   MRN: [id number]  HPI  Pt presents to the clinic today with c/o nausea and abdominal pain. She reports this stated 2 weeks ago. She describes the abdominal pain is a "pulling" sensation in her belly button. She reports bloating and gas. She denies vomiting. Her bowels are moving normally, denies blood in her stool. Her appetite is fine but she is unable to eat much. Her symptoms seems worse when she has an empty stomach. She has not taken anything OTC for this.  Review of Systems  Past Medical History:  Diagnosis Date  . Asthma   . Chicken pox   . 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 Depression   . Migraines     Current Outpatient Medications  Medication Sig Dispense Refill  . doxycycline (VIBRA-TABS) 100 MG tablet doxycycline hyclate 100 mg tablet  TK 1 T PO BID FOR 10 DAYS THEN TK 1 T PO QD THEREAFTER FOR 30 DAYS    . SUMAtriptan (IMITREX) 25 MG tablet Take 1 tablet (25 mg total) by mouth every 2 (two) hours as needed for migraine. May repeat in 2 hours if headache persists or recurs. (Patient not taking: Reported on 07/22/2020) 10 tablet 2  . topiramate (TOPAMAX) 50 MG tablet Take 1 tablet (50 mg total) by mouth daily. (Patient not taking: Reported on 07/22/2020) 30 tablet 5   No current facility-administered medications for this visit.    No Known Allergies  Family History  Problem Relation Age of Onset  . Arthritis Mother   . Breast cancer Maternal Aunt   . Diabetes Paternal Grandmother   . Hypertension Paternal Grandfather   . Colon cancer Maternal Grandmother   . Migraines Neg Hx     Social History   Socioeconomic History  . Marital status: Single    Spouse name: Not on file  . Number of children: 0  . Years of education: Some coll  . Highest education level: Not on file  Occupational History  . Occupation: Post office  Tobacco Use  . Smoking status: Current  Every Day Smoker    Types: Cigars  . Smokeless tobacco: Never Used  . Tobacco comment: 1 cigar daily  Substance and Sexual Activity  . Alcohol use: Yes    Alcohol/week: 0.0 standard drinks    Comment: moderate  . Drug use: No  . Sexual activity: Yes    Birth control/protection: Implant  Other Topics Concern  . Not on file  Social History Narrative   ** Merged History Encounter **       Work or School: works at post [employer] Situation: lives alone       Spiritual Beliefs: none      Lifestyle: no regular exercising; diet is poor      Right-handed   Rare caffeine use, occasional soda         Social Determinants of Radio broadcast assistant Strain: Not on file  Food Insecurity: Not on file  Transportation Needs: Not on file  Physical Activity: Not on file  Stress: Not on file  Social Connections: Not on file  Intimate Partner Violence: Not on file     Constitutional: Denies fever, malaise, fatigue, headache or abrupt weight changes.  Respiratory: Denies difficulty breathing, shortness of breath, cough or sputum production.   Cardiovascular: Denies chest pain, chest tightness, palpitations or swelling in  the hands or feet.  Gastrointestinal: Pt reports abdominal pain, nausea, bloating and gas. Denies constipation, diarrhea or blood in the stool.  GU: Denies urgency, frequency, pain with urination, burning sensation, blood in urine, odor or discharge.  No other specific complaints in a complete review of systems (except as listed in HPI above).     Objective:   Physical Exam  BP 112/70 (BP Location: Right Arm, Patient Position: Sitting, Cuff Size: Normal)   Temp 97.8 F (36.6 C)   Ht 5\' 4"  (1.626 m)   Wt 108 lb (49 kg)   BMI 18.54 kg/m   Wt Readings from Last 3 Encounters:  10/01/20 107 lb (48.5 kg)  07/22/20 103 lb (46.7 kg)  01/18/20 112 lb (50.8 kg)    General: Appears her stated age, well developed, well nourished in NAD. Cardiovascular:  Normal rate and rhythm. S1,S2 noted.  No murmur, rubs or gallops noted Pulmonary/Chest: Normal effort and positive vesicular breath sounds. No respiratory distress. No wheezes, rales or ronchi noted.  Abdomen: Soft and tender in the epigastric region. Normal bowel sounds. No distention or masses noted. Liver, spleen and kidneys non palpable. Neurological: Alert and oriented.    BMET    Component Value Date/Time   NA 138 07/10/2018 0942   K 3.8 07/10/2018 0942   CL 105 07/10/2018 0942   CO2 27 07/10/2018 0942   GLUCOSE 94 07/10/2018 0942   BUN 8 07/10/2018 0942   CREATININE 0.64 07/10/2018 0942   CALCIUM 8.7 (L) 07/10/2018 0942   GFRNONAA >60 07/10/2018 0942   GFRAA >60 07/10/2018 0942    Lipid Panel  No results found for: CHOL, TRIG, HDL, CHOLHDL, VLDL, LDLCALC  CBC    Component Value Date/Time   WBC 4.7 07/10/2018 0942   RBC 3.88 07/10/2018 0942   HGB 11.4 (L) 07/10/2018 0942   HCT 35.9 (L) 07/10/2018 0942   PLT 188 07/10/2018 0942   MCV 92.5 07/10/2018 0942   MCH 29.4 07/10/2018 0942   MCHC 31.8 07/10/2018 0942   RDW 12.2 07/10/2018 0942   LYMPHSABS 1.7 07/10/2018 0942   MONOABS 0.4 07/10/2018 0942   EOSABS 0.1 07/10/2018 0942   BASOSABS 0.0 07/10/2018 0942    Hgb A1C Lab Results  Component Value Date   HGBA1C 5.5 04/25/2018            Assessment & Plan:  Epigastric Pain, Nausea, Abdominal Bloating and Gassiness:  Will check CMET, amylase, lipase and H Pylori Pain has actually improved at this time so will hold off on medication unless labs are abnormal  Will follow up after labs, return precautions discussed  [name], NP This visit occurred during the SARS-CoV-2 public health emergency.  Safety protocols were in place, including screening questions prior to the visit, additional usage of staff PPE, and extensive cleaning of exam room while observing appropriate contact time as indicated for disinfecting solutions.

## 2020-10-31 NOTE — Patient Instructions (Signed)

## 2020-11-09 DIAGNOSIS — U071 COVID-19: Secondary | ICD-10-CM

## 2020-11-09 DIAGNOSIS — M545 Low back pain, unspecified: Secondary | ICD-10-CM

## 2020-11-09 HISTORY — DX: Low back pain, unspecified: M54.50

## 2020-11-09 HISTORY — DX: COVID-19: U07.1

## 2021-02-04 ENCOUNTER — Encounter: Payer: Self-pay | Admitting: Internal Medicine

## 2021-02-04 ENCOUNTER — Ambulatory Visit (INDEPENDENT_AMBULATORY_CARE_PROVIDER_SITE_OTHER): Payer: 59 | Admitting: Internal Medicine

## 2021-02-04 ENCOUNTER — Other Ambulatory Visit: Payer: Self-pay

## 2021-02-04 VITALS — BP 108/68 | HR 76 | Temp 98.1°F | Ht 64.5 in | Wt 112.0 lb

## 2021-02-04 DIAGNOSIS — L709 Acne, unspecified: Secondary | ICD-10-CM | POA: Insufficient documentation

## 2021-02-04 DIAGNOSIS — G43C0 Periodic headache syndromes in child or adult, not intractable: Secondary | ICD-10-CM

## 2021-02-04 DIAGNOSIS — F419 Anxiety disorder, unspecified: Secondary | ICD-10-CM

## 2021-02-04 DIAGNOSIS — Z0001 Encounter for general adult medical examination with abnormal findings: Secondary | ICD-10-CM

## 2021-02-04 DIAGNOSIS — J4520 Mild intermittent asthma, uncomplicated: Secondary | ICD-10-CM

## 2021-02-04 DIAGNOSIS — M79641 Pain in right hand: Secondary | ICD-10-CM

## 2021-02-04 DIAGNOSIS — Z1159 Encounter for screening for other viral diseases: Secondary | ICD-10-CM

## 2021-02-04 DIAGNOSIS — A6000 Herpesviral infection of urogenital system, unspecified: Secondary | ICD-10-CM | POA: Insufficient documentation

## 2021-02-04 DIAGNOSIS — M79642 Pain in left hand: Secondary | ICD-10-CM

## 2021-02-04 DIAGNOSIS — L700 Acne vulgaris: Secondary | ICD-10-CM

## 2021-02-04 DIAGNOSIS — F32A Depression, unspecified: Secondary | ICD-10-CM

## 2021-02-04 DIAGNOSIS — A6004 Herpesviral vulvovaginitis: Secondary | ICD-10-CM

## 2021-02-04 LAB — CBC
HCT: 36.6 % (ref 36.0–46.0)
Hemoglobin: 12.2 g/dL (ref 12.0–15.0)
MCHC: 33.2 g/dL (ref 30.0–36.0)
MCV: 88.5 fl (ref 78.0–100.0)
Platelets: 214 10*3/uL (ref 150.0–400.0)
RBC: 4.14 Mil/uL (ref 3.87–5.11)
RDW: 14.1 % (ref 11.5–15.5)
WBC: 4.4 10*3/uL (ref 4.0–10.5)

## 2021-02-04 LAB — LIPID PANEL
Cholesterol: 170 mg/dL (ref 0–200)
HDL: 60.5 mg/dL (ref >39.00)
LDL Cholesterol: 103 mg/dL — ABNORMAL HIGH (ref 0–99)
NonHDL: 109.58
Total CHOL/HDL Ratio: 3
Triglycerides: 34 mg/dL (ref 0.0–149.0)
VLDL: 6.8 mg/dL (ref 0.0–40.0)

## 2021-02-04 LAB — COMPREHENSIVE METABOLIC PANEL
ALT: 11 U/L (ref 0–35)
AST: 17 U/L (ref 0–37)
Albumin: 4.7 g/dL (ref 3.5–5.2)
Alkaline Phosphatase: 48 U/L (ref 39–117)
BUN: 9 mg/dL (ref 6–23)
CO2: 28 mEq/L (ref 19–32)
Calcium: 9.8 mg/dL (ref 8.4–10.5)
Chloride: 103 mEq/L (ref 96–112)
Creatinine, Ser: 0.73 mg/dL (ref 0.40–1.20)
GFR: 110.27 mL/min (ref >60.00)
Glucose, Bld: 84 mg/dL (ref 70–99)
Potassium: 3.9 mEq/L (ref 3.5–5.1)
Sodium: 139 mEq/L (ref 135–145)
Total Bilirubin: 1.4 mg/dL — ABNORMAL HIGH (ref 0.2–1.2)
Total Protein: 7.9 g/dL (ref 6.0–8.3)

## 2021-02-04 MED ORDER — CELECOXIB 200 MG PO CAPS: 200.0000 mg | ORAL_CAPSULE | Freq: Every day | ORAL | 3 refills | Status: DC

## 2021-02-04 NOTE — Assessment & Plan Note (Signed)
Currently not an issue Will monitor 

## 2021-02-04 NOTE — Patient Instructions (Signed)
Health Maintenance, Female Adopting a healthy lifestyle and getting preventive care are important in promoting health and wellness. Ask your health care provider about:  The right schedule for you to have regular tests and exams.  Things you can do on your own to prevent diseases and keep yourself healthy. What should I know about diet, weight, and exercise? Eat a healthy diet  Eat a diet that includes plenty of vegetables, fruits, low-fat dairy products, and lean protein.  Do not eat a lot of foods that are high in solid fats, added sugars, or sodium.   Maintain a healthy weight Body mass index (BMI) is used to identify weight problems. It estimates body fat based on height and weight. Your health care provider can help determine your BMI and help you achieve or maintain a healthy weight. Get regular exercise Get regular exercise. This is one of the most important things you can do for your health. Most adults should:  Exercise for at least 150 minutes each week. The exercise should increase your heart rate and make you sweat (moderate-intensity exercise).  Do strengthening exercises at least twice a week. This is in addition to the moderate-intensity exercise.  Spend less time sitting. Even light physical activity can be beneficial. Watch cholesterol and blood lipids Have your blood tested for lipids and cholesterol at 31 years of age, then have this test every 5 years. Have your cholesterol levels checked more often if:  Your lipid or cholesterol levels are high.  You are older than 31 years of age.  You are at high risk for heart disease. What should I know about cancer screening? Depending on your health history and family history, you may need to have cancer screening at various ages. This may include screening for:  Breast cancer.  Cervical cancer.  Colorectal cancer.  Skin cancer.  Lung cancer. What should I know about heart disease, diabetes, and high blood  pressure? Blood pressure and heart disease  High blood pressure causes heart disease and increases the risk of stroke. This is more likely to develop in people who have high blood pressure readings, are of African descent, or are overweight.  Have your blood pressure checked: ? Every 3-5 years if you are 18-39 years of age. ? Every year if you are 40 years old or older. Diabetes Have regular diabetes screenings. This checks your fasting blood sugar level. Have the screening done:  Once every three years after age 40 if you are at a normal weight and have a low risk for diabetes.  More often and at a younger age if you are overweight or have a high risk for diabetes. What should I know about preventing infection? Hepatitis B If you have a higher risk for hepatitis B, you should be screened for this virus. Talk with your health care provider to find out if you are at risk for hepatitis B infection. Hepatitis C Testing is recommended for:  Everyone born from 1945 through 1965.  Anyone with known risk factors for hepatitis C. Sexually transmitted infections (STIs)  Get screened for STIs, including gonorrhea and chlamydia, if: ? You are sexually active and are younger than 31 years of age. ? You are older than 31 years of age and your health care provider tells you that you are at risk for this type of infection. ? Your sexual activity has changed since you were last screened, and you are at increased risk for chlamydia or gonorrhea. Ask your health care provider   if you are at risk.  Ask your health care provider about whether you are at high risk for HIV. Your health care provider may recommend a prescription medicine to help prevent HIV infection. If you choose to take medicine to prevent HIV, you should first get tested for HIV. You should then be tested every 3 months for as long as you are taking the medicine. Pregnancy  If you are about to stop having your period (premenopausal) and  you may become pregnant, seek counseling before you get pregnant.  Take 400 to 800 micrograms (mcg) of folic acid every day if you become pregnant.  Ask for birth control (contraception) if you want to prevent pregnancy. Osteoporosis and menopause Osteoporosis is a disease in which the bones lose minerals and strength with aging. This can result in bone fractures. If you are 65 years old or older, or if you are at risk for osteoporosis and fractures, ask your health care provider if you should:  Be screened for bone loss.  Take a calcium or vitamin D supplement to lower your risk of fractures.  Be given hormone replacement therapy (HRT) to treat symptoms of menopause. Follow these instructions at home: Lifestyle  Do not use any products that contain nicotine or tobacco, such as cigarettes, e-cigarettes, and chewing tobacco. If you need help quitting, ask your health care provider.  Do not use street drugs.  Do not share needles.  Ask your health care provider for help if you need support or information about quitting drugs. Alcohol use  Do not drink alcohol if: ? Your health care provider tells you not to drink. ? You are pregnant, may be pregnant, or are planning to become pregnant.  If you drink alcohol: ? Limit how much you use to 0-1 drink a day. ? Limit intake if you are breastfeeding.  Be aware of how much alcohol is in your drink. In the [place] one drink equals one 12 oz bottle of beer (355 mL), one 5 oz glass of wine (148 mL), or one 1 oz glass of hard liquor (44 mL). General instructions  Schedule regular health, dental, and eye exams.  Stay current with your vaccines.  Tell your health care provider if: ? You often feel depressed. ? You have ever been abused or do not feel safe at home. Summary  Adopting a healthy lifestyle and getting preventive care are important in promoting health and wellness.  Follow your health care provider's instructions about healthy  diet, exercising, and getting tested or screened for diseases.  Follow your health care provider's instructions on monitoring your cholesterol and blood pressure. This information is not intended to replace advice given to you by your health care provider. Make sure you discuss any questions you have with your health care provider. Document Revised: 10/19/2018 Document Reviewed: 10/19/2018 Elsevier Patient Education  2021 [employer]  

## 2021-02-04 NOTE — Assessment & Plan Note (Signed)
RX for Celebrex 200 mg PO daily sent to pharmacy

## 2021-02-04 NOTE — Progress Notes (Signed)
Subjective:    Patient ID: Kristen Ball, female    DOB: 1991/09/22, 30 y.o.   MRN: [id number]  HPI  Pt presents to the clinic today for her annual exam. She is also due to follow up chronic conditions.  Anxiety and Depression: She is currently doing well off meds. She is not seeing a therapist. She denies SI/HI.  Asthma: She denies chronic cough or SOB. She is not using any inhalers. There are no PFT's on file.  Migraines: She reports her headaches have not really been an issue lately. She is not currently taking anything for this.  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  Acne: Managed with daily Doxycyline. She follows with dermatology.  Bilateral Hand Pain: She has been evaluated by ortho/hand in the past. She reports she was on Celebrex in the past but was unable to follow up due to cost. She would like to know if I would send in a RX for Celebrex for her today.  Flu: never Tetanus: 2019 Covid: Moderna x 2 Pap Smear: 11/2019 Dentist: as needed  Diet: She does eat meat. She consumes fruits and veggies. She does eat some fried food. Exercise: None  Review of Systems      Past Medical History:  Diagnosis Date  . Asthma   . Chicken pox   . 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 Depression   . Migraines     Current Outpatient Medications  Medication Sig Dispense Refill  . doxycycline (VIBRA-TABS) 100 MG tablet doxycycline hyclate 100 mg tablet  TK 1 T PO BID FOR 10 DAYS THEN TK 1 T PO QD THEREAFTER FOR 30 DAYS    . fluorometholone (FML) 0.1 % ophthalmic suspension INSTILL 1 DROP IN BOTH EYES THREE TIMES DAILY FOR 10 DAYS TWICE DAILY X 10 DAYS THEN ONCE DAILY EVERY DAY X 10 DAYS    . SUMAtriptan (IMITREX) 25 MG tablet Take 1 tablet (25 mg total) by mouth every 2 (two) hours as needed for migraine. May repeat in 2 hours if headache persists or recurs. (Patient not taking: Reported on 10/31/2020) 10 tablet 2  . topiramate (TOPAMAX) 50 MG tablet  Take 1 tablet (50 mg total) by mouth daily. (Patient not taking: Reported on 10/31/2020) 30 tablet 5   No current facility-administered medications for this visit.    No Known Allergies  Family History  Problem Relation Age of Onset  . Arthritis Mother   . Breast cancer Maternal Aunt   . Diabetes Paternal Grandmother   . Hypertension Paternal Grandfather   . Colon cancer Maternal Grandmother   . Migraines Neg Hx     Social History   Socioeconomic History  . Marital status: Single    Spouse name: Not on file  . Number of children: 0  . Years of education: Some coll  . Highest education level: Not on file  Occupational History  . Occupation: Post office  Tobacco Use  . Smoking status: Current Every Day Smoker    Types: Cigars  . Smokeless tobacco: Never Used  . Tobacco comment: 1 cigar daily  Substance and Sexual Activity  . Alcohol use: Yes    Alcohol/week: 0.0 standard drinks    Comment: moderate  . Drug use: No  . Sexual activity: Yes    Birth control/protection: Implant  Other Topics Concern  . Not on file  Social History Narrative   ** Merged History Encounter **       Work or School: works at  post office      Home Situation: lives alone       Spiritual Beliefs: none      Lifestyle: no regular exercising; diet is poor      Right-handed   Rare caffeine use, occasional soda         Social Determinants of Health   Financial Resource Strain: Not on file  Food Insecurity: Not on file  Transportation Needs: Not on file  Physical Activity: Not on file  Stress: Not on file  Social Connections: Not on file  Intimate Partner Violence: Not on file     Constitutional: Denies fever, malaise, fatigue, headache or abrupt weight changes.  HEENT: Denies eye pain, eye redness, ear pain, ringing in the ears, wax buildup, runny nose, nasal congestion, bloody nose, or sore throat. Respiratory: Denies difficulty breathing, shortness of breath, cough or sputum  production.   Cardiovascular: Denies chest pain, chest tightness, palpitations or swelling in the hands or feet.  Gastrointestinal: Denies abdominal pain, bloating, constipation, diarrhea or blood in the stool.  GU: Denies urgency, frequency, pain with urination, burning sensation, blood in urine, odor or discharge. Musculoskeletal: Pt reports bilateral hand pain. Denies decrease in range of motion, difficulty with gait, muscle pain or joint swelling.  Skin: Pt reports acne. Denies redness, rashes, lesions or ulcercations.  Neurological: Denies dizziness, difficulty with memory, difficulty with speech or problems with balance and coordination.  Psych: Denies anxiety, depression, SI/HI.  No other specific complaints in a complete review of systems (except as listed in HPI above).  Objective:   Physical Exam  BP 108/68   Pulse 76   Temp 98.1 F (36.7 C) (Temporal)   Ht 5' 4.5" (1.638 m)   Wt 112 lb (50.8 kg)   SpO2 98%   BMI 18.93 kg/m   Wt Readings from Last 3 Encounters:  10/31/20 108 lb (49 kg)  10/01/20 107 lb (48.5 kg)  07/22/20 103 lb (46.7 kg)    General: Appears her stated age, well developed, well nourished in NAD. Skin: Warm, dry and intact. No rashes noted. HEENT: Head: normal shape and size; Eyes: sclera white, no icterus, conjunctiva pink, PERRLA and EOMs intact;  Neck:  Neck supple, trachea midline. No masses, lumps or thyromegaly present.  Cardiovascular: Normal rate and rhythm. S1,S2 noted.  No murmur, rubs or gallops noted. No JVD or BLE edema.  Pulmonary/Chest: Normal effort and positive vesicular breath sounds. No respiratory distress. No wheezes, rales or ronchi noted.  Abdomen: Soft and nontender. Normal bowel sounds. No distention or masses noted. Liver, spleen and kidneys non palpable. Musculoskeletal: Strength 5/5 BUE/BLE. No difficulty with gait.  Neurological: Alert and oriented. Cranial nerves II-XII grossly intact. Coordination normal.  Psychiatric:  Mood and affect normal. Behavior is normal. Judgment and thought content normal.    BMET    Component Value Date/Time   NA 138 10/31/2020 1124   K 3.9 10/31/2020 1124   CL 104 10/31/2020 1124   CO2 28 10/31/2020 1124   GLUCOSE 93 10/31/2020 1124   BUN 7 10/31/2020 1124   CREATININE 0.72 10/31/2020 1124   CALCIUM 9.4 10/31/2020 1124   GFRNONAA >60 07/10/2018 0942   GFRAA >60 07/10/2018 0942    Lipid Panel  No results found for: CHOL, TRIG, HDL, CHOLHDL, VLDL, LDLCALC  CBC    Component Value Date/Time   WBC 4.7 07/10/2018 0942   RBC 3.88 07/10/2018 0942   HGB 11.4 (L) 07/10/2018 0942   HCT 35.9 (L) 07/10/2018 1610  PLT 188 07/10/2018 0942   MCV 92.5 07/10/2018 0942   MCH 29.4 07/10/2018 0942   MCHC 31.8 07/10/2018 0942   RDW 12.2 07/10/2018 0942   LYMPHSABS 1.7 07/10/2018 0942   MONOABS 0.4 07/10/2018 0942   EOSABS 0.1 07/10/2018 0942   BASOSABS 0.0 07/10/2018 0942    Hgb A1C Lab Results  Component Value Date   HGBA1C 5.5 04/25/2018           Assessment & Plan:   Preventative Health Maintenance:  She declines flu shot Tetanus UTD Encouraged her to get her Covid booster Pap smear UTD Encouraged her to consume a balanced diet and exercise regimen Advised her to see a dentist annually Will check CBC, CMET, Lipid and Hep C today  RTC in 1 year, sooner if needed [name], NP This visit occurred during the SARS-CoV-2 public health emergency.  Safety protocols were in place, including screening questions prior to the visit, additional usage of staff PPE, and extensive cleaning of exam room while observing appropriate contact time as indicated for disinfecting solutions.

## 2021-02-04 NOTE — Assessment & Plan Note (Signed)
Continue suppressive Valtrex

## 2021-02-04 NOTE — Assessment & Plan Note (Signed)
Stable off meds Support offered 

## 2021-02-04 NOTE — Assessment & Plan Note (Signed)
Continue Doxycycline prescribed by dermatology

## 2021-02-05 LAB — HEPATITIS C ANTIBODY
Hepatitis C Ab: NONREACTIVE
SIGNAL TO CUT-OFF: 0.01 (ref <1.00)

## 2021-02-09 ENCOUNTER — Encounter: Payer: Self-pay | Admitting: Internal Medicine

## 2021-05-08 ENCOUNTER — Other Ambulatory Visit: Payer: Self-pay | Admitting: Radiology

## 2021-05-08 DIAGNOSIS — N6320 Unspecified lump in the left breast, unspecified quadrant: Secondary | ICD-10-CM

## 2021-05-21 ENCOUNTER — Other Ambulatory Visit: Payer: Self-pay

## 2021-05-21 ENCOUNTER — Ambulatory Visit
Admission: RE | Admit: 2021-05-21 | Discharge: 2021-05-21 | Disposition: A | Discharge status: Home / Self Care | Payer: 59 | Source: Ambulatory Visit | Attending: Radiology | Admitting: Radiology

## 2021-05-21 DIAGNOSIS — N6320 Unspecified lump in the left breast, unspecified quadrant: Secondary | ICD-10-CM

## 2021-05-21 HISTORY — DX: Unspecified lump in unspecified breast: N63.0

## 2021-08-04 ENCOUNTER — Telehealth (INDEPENDENT_AMBULATORY_CARE_PROVIDER_SITE_OTHER): Payer: 59 | Admitting: Internal Medicine

## 2021-08-04 ENCOUNTER — Telehealth: Payer: Self-pay

## 2021-08-04 ENCOUNTER — Encounter: Payer: Self-pay | Admitting: Internal Medicine

## 2021-08-04 DIAGNOSIS — M62830 Muscle spasm of back: Secondary | ICD-10-CM | POA: Diagnosis not present

## 2021-08-04 MED ORDER — BACLOFEN 10 MG PO TABS: 10.0000 mg | ORAL_TABLET | Freq: Every day | ORAL | 1 refills | Status: DC | PRN

## 2021-08-04 NOTE — Patient Instructions (Signed)

## 2021-08-04 NOTE — Telephone Encounter (Signed)
Work excuse release to the patient mychart.
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
## 2021-09-30 DIAGNOSIS — J101 Influenza due to other identified influenza virus with other respiratory manifestations: Secondary | ICD-10-CM

## 2021-09-30 HISTORY — DX: Influenza due to other identified influenza virus with other respiratory manifestations: J10.1

## 2021-10-22 ENCOUNTER — Encounter: Payer: Self-pay | Admitting: Internal Medicine

## 2021-10-22 ENCOUNTER — Other Ambulatory Visit: Payer: Self-pay

## 2021-10-22 ENCOUNTER — Ambulatory Visit (INDEPENDENT_AMBULATORY_CARE_PROVIDER_SITE_OTHER): Payer: 59 | Admitting: Internal Medicine

## 2021-10-22 DIAGNOSIS — F329 Major depressive disorder, single episode, unspecified: Secondary | ICD-10-CM

## 2021-10-22 DIAGNOSIS — G43C0 Periodic headache syndromes in child or adult, not intractable: Secondary | ICD-10-CM

## 2021-10-22 MED ORDER — TOPIRAMATE 25 MG PO TABS: 25.0000 mg | ORAL_TABLET | Freq: Every evening | ORAL | 1 refills | Status: DC

## 2021-10-22 NOTE — Patient Instructions (Signed)
Migraine Headache A migraine headache is a very strong throbbing pain on one side or both sides of your head. This type of headache can also cause other symptoms. It can last from 4 hours to 3 days. Talk with your doctor about what things may bring on (trigger) this condition. What are the causes? The exact cause of this condition is not known. This condition may be triggered or caused by: Drinking alcohol. Smoking. Taking medicines, such as: Medicine used to treat chest pain (nitroglycerin). Birth control pills. Estrogen. Some blood pressure medicines. Eating or drinking certain products. Doing physical activity. Other things that may trigger a migraine headache include: Having a menstrual period. Pregnancy. Hunger. Stress. Not getting enough sleep or getting too much sleep. Weather changes. Tiredness (fatigue). What increases the risk? Being 25-55 years old. Being female. Having a family history of migraine headaches. Being Caucasian. Having depression or anxiety. Being very overweight. What are the signs or symptoms? A throbbing pain. This pain may: Happen in any area of the head, such as on one side or both sides. Make it hard to do daily activities. Get worse with physical activity. Get worse around bright lights or loud noises. Other symptoms may include: Feeling sick to your stomach (nauseous). Vomiting. Dizziness. Being sensitive to bright lights, loud noises, or smells. Before you get a migraine headache, you may get warning signs (an aura). An aura may include: Seeing flashing lights or having blind spots. Seeing bright spots, halos, or zigzag lines. Having tunnel vision or blurred vision. Having numbness or a tingling feeling. Having trouble talking. Having weak muscles. Some people have symptoms after a migraine headache (postdromal phase), such as: Tiredness. Trouble thinking (concentrating). How is this treated? Taking medicines that: Relieve  pain. Relieve the feeling of being sick to your stomach. Prevent migraine headaches. Treatment may also include: Having acupuncture. Avoiding foods that bring on migraine headaches. Learning ways to control your body functions (biofeedback). Therapy to help you know and deal with negative thoughts (cognitive behavioral therapy). Follow these instructions at home: Medicines Take over-the-counter and prescription medicines only as told by your doctor. Ask your doctor if the medicine prescribed to you: Requires you to avoid driving or using heavy machinery. Can cause trouble pooping (constipation). You may need to take these steps to prevent or treat trouble pooping: Drink enough fluid to keep your pee (urine) pale yellow. Take over-the-counter or prescription medicines. Eat foods that are high in fiber. These include beans, whole grains, and fresh fruits and vegetables. Limit foods that are high in fat and sugar. These include fried or sweet foods. Lifestyle Do not drink alcohol. Do not use any products that contain nicotine or tobacco, such as cigarettes, e-cigarettes, and chewing tobacco. If you need help quitting, ask your doctor. Get at least 8 hours of sleep every night. Limit and deal with stress. General instructions   Keep a journal to find out what may bring on your migraine headaches. For example, write down: What you eat and drink. How much sleep you get. Any change in what you eat or drink. Any change in your medicines. If you have a migraine headache: Avoid things that make your symptoms worse, such as bright lights. It may help to lie down in a dark, quiet room. Do not drive or use heavy machinery. Ask your doctor what activities are safe for you. Keep all follow-up visits as told by your doctor. This is important. Contact a doctor if: You get a migraine headache that   is different or worse than others you have had. You have more than 15 headache days in one  month. Get help right away if: Your migraine headache gets very bad. Your migraine headache lasts longer than 72 hours. You have a fever. You have a stiff neck. You have trouble seeing. Your muscles feel weak or like you cannot control them. You start to lose your balance a lot. You start to have trouble walking. You pass out (faint). You have a seizure. Summary A migraine headache is a very strong throbbing pain on one side or both sides of your head. These headaches can also cause other symptoms. This condition may be treated with medicines and changes to your lifestyle. Keep a journal to find out what may bring on your migraine headaches. Contact a doctor if you get a migraine headache that is different or worse than others you have had. Contact your doctor if you have more than 15 headache days in a month. This information is not intended to replace advice given to you by your health care provider. Make sure you discuss any questions you have with your health care provider. Document Revised: 02/17/2019 Document Reviewed: 12/08/2018 Elsevier Patient Education  2022 [employer]  

## 2021-10-22 NOTE — Progress Notes (Signed)
Subjective:    Patient ID: Kristen Ball, female    DOB: 02-May-1991, 30 y.o.   MRN: [id number]  HPI  Pt presents to the clinic today for follow up of migraines. This is a chronic issue for her. The headaches are located behind her left eye. She describes the pain as pressure, sharp. These occur 2-3 times per month. They are triggered by stress. She reports associated sensitivity to light and nausea. She denies dizziness, sensitivity to sound or vomiting. She is taking Tylenol and Ibuprofen as needed with some relief of symptoms. She does not follow with neurology.  She would also like a referral for a therapist. She has a history of depression and has seen a therapist in the past which seemed to help. She does not want to start on medication therapy at this time. She denies anxiety, SI/HI.  Review of Systems     Past Medical History:  Diagnosis Date   Asthma    Breast mass    Chicken pox    Common migraine with intractable migraine 05/20/2016   Depression    Migraines     Current Outpatient Medications  Medication Sig Dispense Refill   baclofen (LIORESAL) 10 MG tablet Take 1 tablet (10 mg total) by mouth daily as needed for muscle spasms. 30 each 1   celecoxib (CELEBREX) 200 MG capsule Take 1 capsule (200 mg total) by mouth daily. 90 capsule 3   doxycycline (VIBRA-TABS) 100 MG tablet doxycycline hyclate 100 mg tablet  TK 1 T PO BID FOR 10 DAYS THEN TK 1 T PO QD THEREAFTER FOR 30 DAYS     valACYclovir (VALTREX) 500 MG tablet Take 1 tablet by mouth daily.     No current facility-administered medications for this visit.    No Known Allergies  Family History  Problem Relation Age of Onset   Arthritis Mother    Breast cancer Maternal Aunt    Diabetes Paternal Grandmother    Hypertension Paternal Grandfather    Colon cancer Maternal Grandmother    Migraines Neg Hx     Social History   Socioeconomic History   Marital status: Single    Spouse name: Not on file   Number  of children: 0   Years of education: Some coll   Highest education level: Not on file  Occupational History   Occupation: Post office  Tobacco Use   Smoking status: Every Day    Types: Cigars   Smokeless tobacco: Never   Tobacco comments:    1 cigar daily  Substance and Sexual Activity   Alcohol use: Yes    Alcohol/week: 0.0 standard drinks    Comment: moderate   Drug use: No   Sexual activity: Yes    Birth control/protection: Implant  Other Topics Concern   Not on file  Social History Narrative   ** Merged History Encounter **       Work or School: works at post office      Home Situation: lives alone       Spiritual Beliefs: none      Lifestyle: no regular exercising; diet is poor      Right-handed   Rare caffeine use, occasional soda         Social Determinants of Radio broadcast assistant Strain: Not on file  Food Insecurity: Not on file  Transportation Needs: Not on file  Physical Activity: Not on file  Stress: Not on file  Social Connections: Not on file  Intimate  Partner Violence: Not on file     Constitutional: Pt reports headaches. Denies fever, malaise, fatigue, or abrupt weight changes.  HEENT: Denies eye pain, eye redness, ear pain, ringing in the ears, wax buildup, runny nose, nasal congestion, bloody nose, or sore throat. Respiratory: Denies difficulty breathing, shortness of breath, cough or sputum production.   Cardiovascular: Denies chest pain, chest tightness, palpitations or swelling in the hands or feet.  Musculoskeletal: Denies decrease in range of motion, difficulty with gait, muscle pain or joint pain and swelling.  Neurological: Denies dizziness, difficulty with memory, difficulty with speech or problems with balance and coordination.  Psych: Pt has a history of depression. Denies anxiety, SI/HI.  No other specific complaints in a complete review of systems (except as listed in HPI above).  Objective:   Physical Exam BP 121/78 (BP  Location: Right Arm, Patient Position: Sitting, Cuff Size: Small)    Pulse 72    Temp 97.8 F (36.6 C) (Temporal)    Resp 17    Ht 5' 4.5" (1.638 m)    Wt 106 lb 6.4 oz (48.3 kg)    SpO2 100%    BMI 17.98 kg/m   Wt Readings from Last 3 Encounters:  02/04/21 112 lb (50.8 kg)  10/31/20 108 lb (49 kg)  10/01/20 107 lb (48.5 kg)    General: Appears her stated age, well developed, well nourished in NAD. HEENT: Head: normal shape and size; Eyes: sclera white and EOMs intact; Cardiovascular: Normal rate. Pulmonary/Chest: Normal effort.  Neurological: Alert and oriented.  Psychiatric: Mood and affect normal. Behavior is normal. Judgment and thought content normal.    BMET    Component Value Date/Time   NA 139 02/04/2021 1116   K 3.9 02/04/2021 1116   CL 103 02/04/2021 1116   CO2 28 02/04/2021 1116   GLUCOSE 84 02/04/2021 1116   BUN 9 02/04/2021 1116   CREATININE 0.73 02/04/2021 1116   CALCIUM 9.8 02/04/2021 1116   GFRNONAA >60 07/10/2018 0942   GFRAA >60 07/10/2018 0942    Lipid Panel     Component Value Date/Time   CHOL 170 02/04/2021 1116   TRIG 34.0 02/04/2021 1116   HDL 60.50 02/04/2021 1116   CHOLHDL 3 02/04/2021 1116   VLDL 6.8 02/04/2021 1116   LDLCALC 103 (H) 02/04/2021 1116    CBC    Component Value Date/Time   WBC 4.4 02/04/2021 1116   RBC 4.14 02/04/2021 1116   HGB 12.2 02/04/2021 1116   HCT 36.6 02/04/2021 1116   PLT 214.0 02/04/2021 1116   MCV 88.5 02/04/2021 1116   MCH 29.4 07/10/2018 0942   MCHC 33.2 02/04/2021 1116   RDW 14.1 02/04/2021 1116   LYMPHSABS 1.7 07/10/2018 0942   MONOABS 0.4 07/10/2018 0942   EOSABS 0.1 07/10/2018 0942   BASOSABS 0.0 07/10/2018 0942    Hgb A1C Lab Results  Component Value Date   HGBA1C 5.5 04/25/2018           Assessment & Plan:     [name], NP This visit occurred during the SARS-CoV-2 public health emergency.  Safety protocols were in place, including screening questions prior to the visit,  additional usage of staff PPE, and extensive cleaning of exam room while observing appropriate contact time as indicated for disinfecting solutions.

## 2021-10-22 NOTE — Assessment & Plan Note (Signed)
Deteriorated Will start preventative therapy with Topamax 25 mg QHS- sedation caution given Ok to continue Tylenol or Ibuprofen OTC as needed

## 2021-10-22 NOTE — Assessment & Plan Note (Signed)
Referral to psychology for therapy Support offered 

## 2021-11-25 LAB — RESULTS CONSOLE HPV: CHL HPV: POSITIVE

## 2021-11-25 LAB — HM PAP SMEAR

## 2021-11-26 ENCOUNTER — Encounter: Payer: Self-pay | Admitting: Internal Medicine

## 2021-11-27 ENCOUNTER — Telehealth: Payer: Self-pay

## 2021-11-27 NOTE — Telephone Encounter (Signed)
I believe she has been scheduled for a virtual visit for form completion

## 2021-11-27 NOTE — Telephone Encounter (Signed)
Copied from [place] [id number]. Topic: General - Other >> Nov 27, 2021  9:54 AM Kristen Ball wrote: Reason for CRM: pt called in to follow up on paper work completion request. Pt would like to know if provider is able to complete her paperwork for her employer? Pt would like a response back via mychart if possible. Pt says that she has a deadline of 1/25

## 2021-12-01 ENCOUNTER — Telehealth: Payer: 59 | Admitting: Internal Medicine

## 2022-01-12 ENCOUNTER — Encounter: Payer: Self-pay | Admitting: Family Medicine

## 2022-01-12 ENCOUNTER — Telehealth (INDEPENDENT_AMBULATORY_CARE_PROVIDER_SITE_OTHER): Payer: PRIVATE HEALTH INSURANCE | Admitting: Family Medicine

## 2022-01-12 VITALS — Wt 106.0 lb

## 2022-01-12 DIAGNOSIS — A059 Bacterial foodborne intoxication, unspecified: Secondary | ICD-10-CM | POA: Diagnosis not present

## 2022-01-12 DIAGNOSIS — R110 Nausea: Secondary | ICD-10-CM | POA: Diagnosis not present

## 2022-01-12 NOTE — Progress Notes (Signed)
? ?Subjective:  ? ? Patient ID: Kristen Ball, female    DOB: 1991-10-14, 31 y.o.   MRN: [id number] ? ?Kristen Ball is a 31 y.o. female presenting on 01/12/2022 for Abdominal Pain ? ?Virtual / Telehealth Encounter - Video Visit via MyChart ?The purpose of this virtual visit is to provide medical care while limiting exposure to the novel coronavirus (COVID19) for both patient and office staff. ? ?Consent was obtained for remote visit:  Yes.   ?Answered questions that patient had about telehealth interaction:  Yes.   ?I discussed the limitations, risks, security and privacy concerns of performing an evaluation and management service by video/telephone. I also discussed with the patient that there may be a patient responsible charge related to this service. The patient expressed understanding and agreed to proceed. ? ?Patient Location: Home ?Provider Location: [facility] (Office) ? ?Participants in virtual visit: ?- Patient: Kristen Ball ?- CMA: Kristen Ball, CMA ?- Provider: Dr Parks Ball ? ?PCP Kristen Silversmith, FNP  ? ? ?HPI ? ?Food Poisoning / Viral Gastroenteritis / nausea vomiting and Diarrhea ?Currently since this AM, she has had symptoms with nausea vomiting and diarrhea, she has had reduced vomiting, and has zofran at home. She still has nausea and still has diarrhea. She is staying well with hydration toast.water mostly and taking in small amount of toast. Needs note for work.  ?Denies fever abdominal pain chills headache cough dyspnea ? ? ?Depression screen [facility] 2/9 01/12/2022 10/22/2021 02/04/2021  ?Decreased Interest - 2 0  ?Down, Depressed, Hopeless 2 2 0  ?PHQ - 2 Score 2 4 0  ?Altered sleeping 2 2 -  ?Tired, decreased energy 2 2 -  ?Change in appetite 1 1 -  ?Feeling bad or failure about yourself  1 1 -  ?Trouble concentrating 1 1 -  ?Moving slowly or fidgety/restless 0 0 -  ?Suicidal thoughts 0 0 -  ?PHQ-9 Score 9 11 -  ?Difficult doing work/chores Not difficult at all Somewhat  difficult -  ? ? ?Social History  ? ?Tobacco Use  ? Smoking status: Every Day  ?  Types: Cigars  ? Smokeless tobacco: Never  ? Tobacco comments:  ?  1 cigar daily  ?Vaping Use  ? Vaping Use: Never used  ?Substance Use Topics  ? Alcohol use: Yes  ?  Alcohol/week: 0.0 standard drinks  ?  Comment: moderate  ? Drug use: No  ? ? ?Review of Systems ?Per HPI unless specifically indicated above ? ?   ?Objective:  ?  ?Wt 106 lb (48.1 kg)   BMI 17.91 kg/m?   ?Wt Readings from Last 3 Encounters:  ?01/12/22 106 lb (48.1 kg)  ?10/22/21 106 lb 6.4 oz (48.3 kg)  ?02/04/21 112 lb (50.8 kg)  ?  ?Physical Exam ? ?Note examination was completely remotely via video observation objective data only ? ?Gen - well-appearing, no acute distress or apparent pain, comfortable ?HEENT - eyes appear clear without discharge or redness ?Heart/Lungs - cannot examine virtually - observed no evidence of coughing or labored breathing. ?Abd - cannot examine virtually  ?Skin - face visible today- no rash ?Neuro - awake, alert, oriented ?Psych - not anxious appearing ? ? ?Results for orders placed or performed in visit on 02/04/21  ?CBC  ?Result Value Ref Range  ? WBC 4.4 4.0 - 10.5 K/uL  ? RBC 4.14 3.87 - 5.11 Mil/uL  ? Platelets 214.0 150.0 - 400.0 K/uL  ? Hemoglobin 12.2 12.0 - 15.0 g/dL  ?  HCT 36.6 36.0 - 46.0 %  ? MCV 88.5 78.0 - 100.0 fl  ? MCHC 33.2 30.0 - 36.0 g/dL  ? RDW 14.1 11.5 - 15.5 %  ?Comprehensive metabolic panel  ?Result Value Ref Range  ? Sodium 139 135 - 145 mEq/L  ? Potassium 3.9 3.5 - 5.1 mEq/L  ? Chloride 103 96 - 112 mEq/L  ? CO2 28 19 - 32 mEq/L  ? Glucose, Bld 84 70 - 99 mg/dL  ? BUN 9 6 - 23 mg/dL  ? Creatinine, Ser 0.73 0.40 - 1.20 mg/dL  ? Total Bilirubin 1.4 (H) 0.2 - 1.2 mg/dL  ? Alkaline Phosphatase 48 39 - 117 U/L  ? AST 17 0 - 37 U/L  ? ALT 11 0 - 35 U/L  ? Total Protein 7.9 6.0 - 8.3 g/dL  ? Albumin 4.7 3.5 - 5.2 g/dL  ? GFR 110.27 >60.00 mL/min  ? Calcium 9.8 8.4 - 10.5 mg/dL  ?Lipid panel  ?Result Value Ref Range  ?  Cholesterol 170 0 - 200 mg/dL  ? Triglycerides 34.0 0.0 - 149.0 mg/dL  ? HDL 60.50 >39.00 mg/dL  ? VLDL 6.8 0.0 - 40.0 mg/dL  ? LDL Cholesterol 103 (H) 0 - 99 mg/dL  ? Total CHOL/HDL Ratio 3   ? NonHDL 109.58   ?Hepatitis C antibody  ?Result Value Ref Range  ? Hepatitis C Ab NON-REACTIVE NON-REACTI  ? SIGNAL TO CUT-OFF 0.01 <1.00  ? ?   ?Assessment & Plan:  ? ?Problem List Items Addressed This Visit   ?None ?Visit Diagnoses   ? ? Nausea    -  Primary  ? Food poisoning      ? ?  ?  ?Gastroenteritis ?Suspected food poisoning ?Similar symptoms she has had before ?Acute onset within past 12 hours ?Keep up hydration with water, add electrolytes w apple juice / gatorade options ?PO intake as tolerated with advancing diet ?Use Zofran PRN - offered to re order. She does not need new rx ?May take Imodium if severe ?Follow up criteria given ?Work note ? ? ?No orders of the defined types were placed in this encounter. ? ? ? ? ?Follow up plan: ?Return if symptoms worsen or fail to improve. ? ?Nobie Putnam, DO ?[facility] ?[facility] ?01/12/2022, 4:42 PM ?

## 2022-01-12 NOTE — Patient Instructions (Addendum)
   Please schedule a Follow-up Appointment to: Return if symptoms worsen or fail to improve.  If you have any other questions or concerns, please feel free to call the office or send a message through MyChart. You may also schedule an earlier appointment if necessary.  Additionally, you may be receiving a survey about your experience at our office within a few days to 1 week by e-mail or mail. We value your feedback.  Jaspreet Bodner, DO [facility], [facility] 

## 2022-02-09 ENCOUNTER — Telehealth (INDEPENDENT_AMBULATORY_CARE_PROVIDER_SITE_OTHER): Payer: PRIVATE HEALTH INSURANCE | Admitting: Family Medicine

## 2022-02-09 ENCOUNTER — Encounter: Payer: Self-pay | Admitting: Family Medicine

## 2022-02-09 DIAGNOSIS — U071 COVID-19: Secondary | ICD-10-CM

## 2022-02-09 MED ORDER — PREDNISONE 10 MG PO TABS: ORAL_TABLET | ORAL | 0 refills | Status: DC

## 2022-02-09 NOTE — Patient Instructions (Addendum)
Use Prednisone taper for breathing and covid symptoms ?May take tylenol with it ?Avoid ibuprofen while on prednisone ?Use mucinex, can use flonase and other otc decongestant such as sudafed ? ?Please schedule a Follow-up Appointment to: Return if symptoms worsen or fail to improve. ? ?If you have any other questions or concerns, please feel free to call the office or send a message through [place]. You may also schedule an earlier appointment if necessary. ? ?Additionally, you may be receiving a survey about your experience at our office within a few days to 1 week by e-mail or mail. We value your feedback. ? ?Nobie Putnam, DO ?[facility] ?

## 2022-02-09 NOTE — Progress Notes (Signed)
? ?Subjective:  ? ? Patient ID: Kristen Ball, female    DOB: February 18, 1991, 31 y.o.   MRN: [id number] ? ?Kristen Ball is a 31 y.o. female presenting on 02/09/2022 for Headache and Sore Throat ? ?PCP [name], FNP ? ?Virtual / Telehealth Encounter - Video Visit via MyChart ?The purpose of this virtual visit is to provide medical care while limiting exposure to the novel coronavirus (COVID19) for both patient and office staff. ? ?Consent was obtained for remote visit:  Yes.   ?Answered questions that patient had about telehealth interaction:  Yes.   ?I discussed the limitations, risks, security and privacy concerns of performing an evaluation and management service by video/telephone. I also discussed with the patient that there may be a patient responsible charge related to this service. The patient expressed understanding and agreed to proceed. ? ?Patient Location: Home ?Provider Location: [facility] (Office) ? ?Participants in virtual visit: ?- Patient: Kristen Ball ?- CMA: Orinda Kenner, CMA ?- Provider: Dr Parks Ranger ? ? ?HPI ? ?COVID-19 Positive ?She reports symptoms started  4am with sore throat and body aches. She took migraine medicine with some relief ?[place] positive today ?Taking Tylenol, drinking tea ?Out of work needed to be seen. ?Admits sinus drainage. ?Last COVID 11/2020, she treated it symptomatically. ? ? ?Health Maintenance: ?UTD initial moderna vaccines ? ? ?  01/12/2022  ?  4:17 PM 10/22/2021  ?  9:35 AM 02/04/2021  ?  1:23 PM  ?Depression screen PHQ 2/9  ?Decreased Interest  2 0  ?Down, Depressed, Hopeless 2 2 0  ?PHQ - 2 Score 2 4 0  ?Altered sleeping 2 2   ?Tired, decreased energy 2 2   ?Change in appetite 1 1   ?Feeling bad or failure about yourself  1 1   ?Trouble concentrating 1 1   ?Moving slowly or fidgety/restless 0 0   ?Suicidal thoughts 0 0   ?PHQ-9 Score 9 11   ?Difficult doing work/chores Not difficult at all Somewhat difficult   ? ? ?Social History   ? ?Tobacco Use  ? Smoking status: Every Day  ?  Types: Cigars  ? Smokeless tobacco: Never  ? Tobacco comments:  ?  1 cigar daily  ?Vaping Use  ? Vaping Use: Never used  ?Substance Use Topics  ? Alcohol use: Yes  ?  Alcohol/week: 0.0 standard drinks  ?  Comment: moderate  ? Drug use: No  ? ? ?Review of Systems ?Per HPI unless specifically indicated above ? ?   ?Objective:  ?  ?There were no vitals taken for this visit.  ?Wt Readings from Last 3 Encounters:  ?01/12/22 106 lb (48.1 kg)  ?10/22/21 106 lb 6.4 oz (48.3 kg)  ?02/04/21 112 lb (50.8 kg)  ?  ?Physical Exam ? ?Note examination was completely remotely via video observation objective data only ? ?Gen - well-appearing, no acute distress or apparent pain, comfortable ?HEENT - eyes appear clear without discharge or redness ?Heart/Lungs - cannot examine virtually - observed no evidence of coughing or labored breathing. ?Abd - cannot examine virtually  ?Skin - face visible today- no rash ?Neuro - awake, alert, oriented ?Psych - not anxious appearing ? ? ?Results for orders placed or performed in visit on 02/04/21  ?CBC  ?Result Value Ref Range  ? WBC 4.4 4.0 - 10.5 K/uL  ? RBC 4.14 3.87 - 5.11 Mil/uL  ? Platelets 214.0 150.0 - 400.0 K/uL  ? Hemoglobin 12.2 12.0 - 15.0 g/dL  ?  HCT 36.6 36.0 - 46.0 %  ? MCV 88.5 78.0 - 100.0 fl  ? MCHC 33.2 30.0 - 36.0 g/dL  ? RDW 14.1 11.5 - 15.5 %  ?Comprehensive metabolic panel  ?Result Value Ref Range  ? Sodium 139 135 - 145 mEq/L  ? Potassium 3.9 3.5 - 5.1 mEq/L  ? Chloride 103 96 - 112 mEq/L  ? CO2 28 19 - 32 mEq/L  ? Glucose, Bld 84 70 - 99 mg/dL  ? BUN 9 6 - 23 mg/dL  ? Creatinine, Ser 0.73 0.40 - 1.20 mg/dL  ? Total Bilirubin 1.4 (H) 0.2 - 1.2 mg/dL  ? Alkaline Phosphatase 48 39 - 117 U/L  ? AST 17 0 - 37 U/L  ? ALT 11 0 - 35 U/L  ? Total Protein 7.9 6.0 - 8.3 g/dL  ? Albumin 4.7 3.5 - 5.2 g/dL  ? GFR 110.27 >60.00 mL/min  ? Calcium 9.8 8.4 - 10.5 mg/dL  ?Lipid panel  ?Result Value Ref Range  ? Cholesterol 170 0 - 200 mg/dL   ? Triglycerides 34.0 0.0 - 149.0 mg/dL  ? HDL 60.50 >39.00 mg/dL  ? VLDL 6.8 0.0 - 40.0 mg/dL  ? LDL Cholesterol 103 (H) 0 - 99 mg/dL  ? Total CHOL/HDL Ratio 3   ? NonHDL 109.58   ?Hepatitis C antibody  ?Result Value Ref Range  ? Hepatitis C Ab NON-REACTIVE NON-REACTI  ? SIGNAL TO CUT-OFF 0.01 <1.00  ? ?   ?Assessment & Plan:  ? ?Problem List Items Addressed This Visit   ?None ?Visit Diagnoses   ? ? COVID-19 virus infection    -  Primary  ? Relevant Medications  ? predniSONE (DELTASONE) 10 MG tablet  ? ?  ?  ?COVID19 positive ?Symptom 1st onset today 02/09/22 ?Confirm home test positive today 02/09/22 ?Mild to moderate symptoms currently. No red flags or dyspnea ?Limited risk factors but has mild asthma, migraines ? ?Start Prednisone x 6 day taper ?Use OTC supportive medications as advised ?Follow-up criteria given. ?Return precautions ?Quarantine guidelines and note for work sent ? ? ?Meds ordered this encounter  ?Medications  ? predniSONE (DELTASONE) 10 MG tablet  ?  Sig: Take 6 tabs with breakfast Day 1, 5 tabs Day 2, 4 tabs Day 3, 3 tabs Day 4, 2 tabs Day 5, 1 tab Day 6.  ?  Dispense:  21 tablet  ?  Refill:  0  ? ? ? ? ?Follow up plan: ?Return if symptoms worsen or fail to improve. ? ? ?Patient verbalizes understanding with the above medical recommendations including the limitation of remote medical advice. ? ?Specific follow-up and call-back criteria were given for patient to follow-up or seek medical care more urgently if needed. ? ?Total duration of direct patient care provided via video conference: 9 minutes ? ? ?Nobie Putnam, DO ?[facility] ?[facility] ?02/09/2022, 4:17 PM ?

## 2022-02-12 ENCOUNTER — Telehealth: Payer: Self-pay

## 2022-02-12 NOTE — Telephone Encounter (Signed)
This has to be done by GYN ?

## 2022-02-12 NOTE — Telephone Encounter (Signed)
Do you remove IUDs? ? ?Thanks,  ? ?-Mickel Baas  ?

## 2022-02-12 NOTE — Telephone Encounter (Signed)
Pt advised.   Thanks,   -Richel Millspaugh  

## 2022-02-12 NOTE — Telephone Encounter (Signed)
Copied from [place] [phone]. Topic: General - Call Back - No Documentation ?>> Feb 12, 2022 11:14 AM Erick Blinks wrote: ?Reason for CRM: Pt called and wants to know if PCP does IUD removal, she wants to have this removed during her CPE if possible. Please advise  ?Best contact: [phone] ?

## 2022-02-19 ENCOUNTER — Encounter: Payer: Self-pay | Admitting: Internal Medicine

## 2022-02-19 ENCOUNTER — Ambulatory Visit (INDEPENDENT_AMBULATORY_CARE_PROVIDER_SITE_OTHER): Payer: PRIVATE HEALTH INSURANCE | Admitting: Internal Medicine

## 2022-02-19 VITALS — BP 114/78 | HR 90 | Temp 97.3°F | Ht 64.0 in | Wt 106.0 lb

## 2022-02-19 DIAGNOSIS — M79641 Pain in right hand: Secondary | ICD-10-CM | POA: Diagnosis not present

## 2022-02-19 DIAGNOSIS — R634 Abnormal weight loss: Secondary | ICD-10-CM | POA: Diagnosis not present

## 2022-02-19 DIAGNOSIS — H538 Other visual disturbances: Secondary | ICD-10-CM

## 2022-02-19 DIAGNOSIS — R202 Paresthesia of skin: Secondary | ICD-10-CM | POA: Diagnosis not present

## 2022-02-19 DIAGNOSIS — M549 Dorsalgia, unspecified: Secondary | ICD-10-CM

## 2022-02-19 DIAGNOSIS — G8929 Other chronic pain: Secondary | ICD-10-CM

## 2022-02-19 DIAGNOSIS — M79642 Pain in left hand: Secondary | ICD-10-CM

## 2022-02-19 DIAGNOSIS — Z0001 Encounter for general adult medical examination with abnormal findings: Secondary | ICD-10-CM

## 2022-02-19 MED ORDER — GABAPENTIN 100 MG PO CAPS: 100.0000 mg | ORAL_CAPSULE | Freq: Two times a day (BID) | ORAL | 1 refills | Status: DC

## 2022-02-19 MED ORDER — METHOCARBAMOL 500 MG PO TABS: 500.0000 mg | ORAL_TABLET | Freq: Every evening | ORAL | 1 refills | Status: DC | PRN

## 2022-02-19 NOTE — Patient Instructions (Signed)

## 2022-02-19 NOTE — Progress Notes (Signed)
? ?Subjective:  ? ? Patient ID: Kristen Ball, female    DOB: 1991-04-30, 31 y.o.   MRN: [id number] ? ?HPI ? ?Patient presents to clinic today for her annual exam. ? ?Flu: 10/2021 ?Tetanus: 08/2018 ?COVID: Moderna x2 ?Pap smear: 12/2021, [facility] ?Dentist: biannually ? ?Diet: She does eat meat. She does not eat any fruits or veggies. She does eat fried foods. She drinks mostly water, some soda. ?Exercise: Body weight extercises ? ?Review of Systems ? ?   ?Past Medical History:  ?Diagnosis Date  ? Asthma   ? Breast mass   ? Chicken pox   ? Common migraine with intractable migraine 05/20/2016  ? Depression   ? Migraines   ? ? ?Current Outpatient Medications  ?Medication Sig Dispense Refill  ? celecoxib (CELEBREX) 200 MG capsule Take 1 capsule (200 mg total) by mouth daily. 90 capsule 3  ? predniSONE (DELTASONE) 10 MG tablet Take 6 tabs with breakfast Day 1, 5 tabs Day 2, 4 tabs Day 3, 3 tabs Day 4, 2 tabs Day 5, 1 tab Day 6. 21 tablet 0  ? topiramate (TOPAMAX) 25 MG tablet Take 1 tablet (25 mg total) by mouth at bedtime. 90 tablet 1  ? valACYclovir (VALTREX) 1000 MG tablet valacyclovir 1 gram tablet ? TAKE 1 TABLET BY MOUTH EVERY DAY    ? ?No current facility-administered medications for this visit.  ? ? ?Allergies  ?Allergen Reactions  ? Metronidazole Hives, Itching and Rash  ? ? ?Family History  ?Problem Relation Age of Onset  ? Arthritis Mother   ? Breast cancer Maternal Aunt   ? Diabetes Paternal Grandmother   ? Hypertension Paternal Grandfather   ? Colon cancer Maternal Grandmother   ? Migraines Neg Hx   ? ? ?Social History  ? ?Socioeconomic History  ? Marital status: Single  ?  Spouse name: Not on file  ? Number of children: 0  ? Years of education: Some coll  ? Highest education level: Not on file  ?Occupational History  ? Occupation: [employer]  ?Tobacco Use  ? Smoking status: Every Day  ?  Types: Cigars  ? Smokeless tobacco: Never  ? Tobacco comments:  ?  1 cigar daily  ?Vaping Use  ? Vaping Use:  Never used  ?Substance and Sexual Activity  ? Alcohol use: Yes  ?  Alcohol/week: 0.0 standard drinks  ?  Comment: moderate  ? Drug use: No  ? Sexual activity: Yes  ?  Birth control/protection: Implant  ?Other Topics Concern  ? Not on file  ?Social History Narrative  ? ** Merged History Encounter **  ?    ? Work or School: works at post office  ?   ? Home Situation: lives alone  ?    ? Spiritual Beliefs: none  ?   ? Lifestyle: no regular exercising; diet is poor  ?   ? Right-handed  ? Rare caffeine use, occasional soda  ?   ?   ? ?Social Determinants of Health  ? ?Financial Resource Strain: Not on file  ?Food Insecurity: Not on file  ?Transportation Needs: Not on file  ?Physical Activity: Not on file  ?Stress: Not on file  ?Social Connections: Not on file  ?Intimate Partner Violence: Not on file  ? ? ? ?Constitutional: Patient reports intermittent headaches, weight loss.  Denies fever, malaise, fatigue.  ?HEENT: Pt reports blurred vision. Denies eye pain, eye redness, ear pain, ringing in the ears, wax buildup, runny nose, nasal congestion,  bloody nose, or sore throat. ?Respiratory: Denies difficulty breathing, shortness of breath, cough or sputum production.   ?Cardiovascular: Denies chest pain, chest tightness, palpitations or swelling in the hands or feet.  ?Gastrointestinal: Denies abdominal pain, bloating, constipation, diarrhea or blood in the stool.  ?GU: Denies urgency, frequency, pain with urination, burning sensation, blood in urine, odor or discharge. ?Musculoskeletal: Patient reports bilateral hand pain, back pain.  Denies decrease in range of motion, difficulty with gait, or joint swelling.  ?Skin: Denies redness, rashes, lesions or ulcercations.  ?Neurological: Denies dizziness, difficulty with memory, difficulty with speech or problems with balance and coordination.  ?Psych: Patient has a history of depression.  Denies anxiety, SI/HI. ? ?No other specific complaints in a complete review of systems  (except as listed in HPI above). ? ?Objective:  ? Physical Exam ? ?BP 114/78 (BP Location: Right Arm, Patient Position: Sitting, Cuff Size: Normal)   Pulse 90   Temp (!) 97.3 ?F (36.3 ?C) (Temporal)   Ht $R'5\' 4"'hj$  (1.626 m)   Wt 106 lb (48.1 kg)   SpO2 100%   BMI 18.19 kg/m?  ? ?Wt Readings from Last 3 Encounters:  ?01/12/22 106 lb (48.1 kg)  ?10/22/21 106 lb 6.4 oz (48.3 kg)  ?02/04/21 112 lb (50.8 kg)  ? ? ?General: Appears her stated age, well developed, well nourished in NAD. ?Skin: Warm, dry and intact.  ?HEENT: Head: normal shape and size; Eyes: sclera white and EOMs intact;  ?Neck:  Neck supple, trachea midline. No masses, lumps or thyromegaly present.  ?Cardiovascular: Normal rate and rhythm. S1,S2 noted.  No murmur, rubs or gallops noted. No JVD or BLE edema.  ?Pulmonary/Chest: Normal effort and positive vesicular breath sounds. No respiratory distress. No wheezes, rales or ronchi noted.  ?Abdomen: Soft and nontender. Normal bowel sounds. No distention or masses noted. Liver, spleen and kidneys non palpable. ?Musculoskeletal: Strength 5/5 BUE/BLE. No difficulty with gait.  ?Neurological: Alert and oriented. Cranial nerves II-XII grossly intact. Coordination normal.  ?Psychiatric: Mood and affect normal. Behavior is normal. Judgment and thought content normal.  ? ? ?BMET ?   ?Component Value Date/Time  ? NA 139 02/04/2021 1116  ? K 3.9 02/04/2021 1116  ? CL 103 02/04/2021 1116  ? CO2 28 02/04/2021 1116  ? GLUCOSE 84 02/04/2021 1116  ? BUN 9 02/04/2021 1116  ? CREATININE 0.73 02/04/2021 1116  ? CALCIUM 9.8 02/04/2021 1116  ? GFRNONAA >60 07/10/2018 0942  ? GFRAA >60 07/10/2018 0942  ? ? ?Lipid Panel  ?   ?Component Value Date/Time  ? CHOL 170 02/04/2021 1116  ? TRIG 34.0 02/04/2021 1116  ? HDL 60.50 02/04/2021 1116  ? CHOLHDL 3 02/04/2021 1116  ? VLDL 6.8 02/04/2021 1116  ? LDLCALC 103 (H) 02/04/2021 1116  ? ? ?CBC ?   ?Component Value Date/Time  ? WBC 4.4 02/04/2021 1116  ? RBC 4.14 02/04/2021 1116  ? HGB  12.2 02/04/2021 1116  ? HCT 36.6 02/04/2021 1116  ? PLT 214.0 02/04/2021 1116  ? MCV 88.5 02/04/2021 1116  ? MCH 29.4 07/10/2018 0942  ? MCHC 33.2 02/04/2021 1116  ? RDW 14.1 02/04/2021 1116  ? LYMPHSABS 1.7 07/10/2018 0942  ? MONOABS 0.4 07/10/2018 0942  ? EOSABS 0.1 07/10/2018 0942  ? BASOSABS 0.0 07/10/2018 0942  ? ? ?Hgb A1C ?Lab Results  ?Component Value Date  ? HGBA1C 5.5 04/25/2018  ? ? ? ?   ?Assessment & Plan:  ? ? ?Preventative Health Maintenance: ? ?Encouraged her to get  a flu shot in the fall ?Tetanus UTD ?Encouraged her to get a COVID booster ?Pap smear UTD, will request records ?Encouraged her to consume a balanced diet and exercise regimen ?Advised her to see a dentist annually ?We will check CBC, c-Met, TSH, lipid profile and A1C today ? ?Unintentional Weight Loss, Blurred Vision, Bilateral Hand Pain, Chronic Back Pain, Paresthesia of Left Foot: ? ?Will check CBC, cMET, TSH, and A1C today ?Continue Celebrex 200 mg daily ?RX for Gabapentin 100 mg BID - sedation caution given ?RX for Methocarbamol 500 mg QHS prn ?Consider repeating autoimmune workup vs PT vs xrays ? ?RTC in 6 months, follow-up chronic conditions ?[name], NP ? ? ? ?

## 2022-02-20 ENCOUNTER — Other Ambulatory Visit: Payer: Self-pay | Admitting: Internal Medicine

## 2022-02-20 LAB — HEMOGLOBIN A1C
Hgb A1c MFr Bld: 5.5 % of total Hgb (ref <5.7)
Mean Plasma Glucose: 111 mg/dL
eAG (mmol/L): 6.2 mmol/L

## 2022-02-20 LAB — LIPID PANEL
Cholesterol: 170 mg/dL (ref <200)
HDL: 64 mg/dL (ref >=50)
LDL Cholesterol (Calc): 94 mg/dL (calc)
Non-HDL Cholesterol (Calc): 106 mg/dL (calc) (ref <130)
Total CHOL/HDL Ratio: 2.7 (calc) (ref <5.0)
Triglycerides: 42 mg/dL (ref <150)

## 2022-02-20 LAB — CBC
HCT: 39.6 % (ref 35.0–45.0)
Hemoglobin: 13 g/dL (ref 11.7–15.5)
MCH: 30.2 pg (ref 27.0–33.0)
MCHC: 32.8 g/dL (ref 32.0–36.0)
MCV: 92.1 fL (ref 80.0–100.0)
MPV: 11 fL (ref 7.5–12.5)
Platelets: 218 10*3/uL (ref 140–400)
RBC: 4.3 10*6/uL (ref 3.80–5.10)
RDW: 12.3 % (ref 11.0–15.0)
WBC: 8.3 10*3/uL (ref 3.8–10.8)

## 2022-02-20 LAB — COMPLETE METABOLIC PANEL WITH GFR
AG Ratio: 1.8 (calc) (ref 1.0–2.5)
ALT: 8 U/L (ref 6–29)
AST: 12 U/L (ref 10–30)
Albumin: 5 g/dL (ref 3.6–5.1)
Alkaline phosphatase (APISO): 56 U/L (ref 31–125)
BUN: 8 mg/dL (ref 7–25)
CO2: 27 mmol/L (ref 20–32)
Calcium: 9.9 mg/dL (ref 8.6–10.2)
Chloride: 102 mmol/L (ref 98–110)
Creat: 0.68 mg/dL (ref 0.50–0.97)
Globulin: 2.8 g/dL (calc) (ref 1.9–3.7)
Glucose, Bld: 76 mg/dL (ref 65–99)
Potassium: 3.8 mmol/L (ref 3.5–5.3)
Sodium: 139 mmol/L (ref 135–146)
Total Bilirubin: 1.6 mg/dL — ABNORMAL HIGH (ref 0.2–1.2)
Total Protein: 7.8 g/dL (ref 6.1–8.1)
eGFR: 119 mL/min/{1.73_m2} (ref >=60)

## 2022-02-20 LAB — TSH: TSH: 0.42 mIU/L

## 2022-02-23 NOTE — Telephone Encounter (Signed)
Requested Prescriptions  ?Pending Prescriptions Disp Refills  ?? celecoxib (CELEBREX) 200 MG capsule [Pharmacy Med Name: CELECOXIB $RemoveBeforeD'200MG'[id number]$  CAPSULES] 90 capsule 3  ?  Sig: TAKE 1 CAPSULE(200 MG) BY MOUTH DAILY  ?  ? Analgesics:  COX2 Inhibitors Failed - 02/23/2022  8:06 AM  ?  ?  Failed - Manual Review: Labs are only required if the patient has taken medication for more than 8 weeks.  ?  ?  Passed - HGB in normal range and within 360 days  ?  Hemoglobin  ?Date Value Ref Range Status  ?02/19/2022 13.0 11.7 - 15.5 g/dL Final  ?   ?  ?  Passed - Cr in normal range and within 360 days  ?  Creat  ?Date Value Ref Range Status  ?02/19/2022 0.68 0.50 - 0.97 mg/dL Final  ?   ?  ?  Passed - HCT in normal range and within 360 days  ?  HCT  ?Date Value Ref Range Status  ?02/19/2022 39.6 35.0 - 45.0 % Final  ?   ?  ?  Passed - AST in normal range and within 360 days  ?  AST  ?Date Value Ref Range Status  ?02/19/2022 12 10 - 30 U/L Final  ?   ?  ?  Passed - ALT in normal range and within 360 days  ?  ALT  ?Date Value Ref Range Status  ?02/19/2022 8 6 - 29 U/L Final  ?   ?  ?  Passed - eGFR is 30 or above and within 360 days  ?  GFR calc Af Amer  ?Date Value Ref Range Status  ?07/10/2018 >60 >60 mL/min Final  ?  Comment:  ?  (NOTE) ?The eGFR has been calculated using the CKD EPI equation. ?This calculation has not been validated in all clinical situations. ?eGFR's persistently <60 mL/min signify possible Chronic Kidney ?Disease. ?  ? ?GFR calc non Af Amer  ?Date Value Ref Range Status  ?07/10/2018 >60 >60 mL/min Final  ? ?GFR  ?Date Value Ref Range Status  ?02/04/2021 110.27 >60.00 mL/min Final  ?  Comment:  ?  Calculated using the CKD-EPI Creatinine Equation (2021)  ? ?eGFR  ?Date Value Ref Range Status  ?02/19/2022 119 > OR = 60 mL/min/1.39m2 Final  ?  Comment:  ?  The eGFR is based on the CKD-EPI 2021 equation. To calculate  ?the new eGFR from a previous Creatinine or Cystatin C ?result, go to  https://www.kidney.org/professionals/ ?kdoqi/gfr%5Fcalculator ?  ?   ?  ?  Passed - Patient is not pregnant  ?  ?  Passed - Valid encounter within last 12 months  ?  Recent Outpatient Visits   ?      ? 4 days ago Encounter for general adult medical examination with abnormal findings  ? [facility] [place], [place], NP  ? 2 weeks ago COVID-19 virus infection  ? [place], DO  ? 1 month ago Nausea  ? [place], DO  ? 4 months ago Periodic headache syndrome, not intractable  ? [facility] [place], [place], NP  ? 6 months ago Back muscle spasm  ? [facility] [place], Coralie Keens, NP  ?  ?  ? ?  ?  ?  ? ?

## 2022-02-24 ENCOUNTER — Encounter: Payer: Self-pay | Admitting: Internal Medicine

## 2022-02-25 ENCOUNTER — Encounter: Payer: Self-pay | Admitting: Internal Medicine

## 2022-03-20 ENCOUNTER — Ambulatory Visit: Payer: Self-pay

## 2022-03-20 NOTE — Telephone Encounter (Signed)
? ? ?  Chief Complaint: Left breast pain, history of lumpectomy years ago. ?Symptoms: Pain ?Frequency: 1 month ago, getting worse. ?Pertinent Negatives: Patient denies redness ?Disposition: '[]'$ ED /'[]'$ Urgent Care (no appt availability in office) / '[]'$ Appointment(In office/virtual)/ '[]'$  [place] [facility]/ '[]'$ Home Care/ '[]'$ Refused Recommended Disposition /'[]'$ [place] [facility]/ '[x]'$  Follow-up with PCP ?Additional Notes: Requesting to be worked in today.  ?Answer Assessment - Initial Assessment Questions ?1. SYMPTOM: "What's the main symptom you're concerned about?"  (e.g., lump, pain, rash, nipple discharge) ?    Pain left breat ?2. LOCATION: "Where is the pain located?" ?    Left breast ?3. ONSET: "When did pain  start?" ?    1 month ago ?4. PRIOR HISTORY: "Do you have any history of prior problems with your breasts?" (e.g., lumps, cancer, fibrocystic breast disease) ?    Lumpectomy ?5. CAUSE: "What do you think is causing this symptom?" ?    Unsure ?6. OTHER SYMPTOMS: "Do you have any other symptoms?" (e.g., fever, breast pain, redness or rash, nipple discharge) ?    No ?7. PREGNANCY-BREASTFEEDING: "Is there any chance you are pregnant?" "When was your last menstrual period?" "Are you breastfeeding?" ?    No ? ?Protocols used: Breast Symptoms-A-AH ? ?

## 2022-03-23 ENCOUNTER — Encounter: Payer: Self-pay | Admitting: Internal Medicine

## 2022-03-23 ENCOUNTER — Ambulatory Visit (INDEPENDENT_AMBULATORY_CARE_PROVIDER_SITE_OTHER): Payer: PRIVATE HEALTH INSURANCE | Admitting: Internal Medicine

## 2022-03-23 VITALS — BP 102/64 | HR 80 | Temp 97.1°F | Wt 106.0 lb

## 2022-03-23 DIAGNOSIS — R1012 Left upper quadrant pain: Secondary | ICD-10-CM

## 2022-03-23 DIAGNOSIS — F329 Major depressive disorder, single episode, unspecified: Secondary | ICD-10-CM

## 2022-03-23 DIAGNOSIS — N644 Mastodynia: Secondary | ICD-10-CM

## 2022-03-23 MED ORDER — MIRTAZAPINE 7.5 MG PO TABS: 7.5000 mg | ORAL_TABLET | Freq: Every day | ORAL | 2 refills | Status: DC

## 2022-03-23 NOTE — Patient Instructions (Signed)
Fibrocystic Breast Changes ? ?Fibrocystic breast changes are changes that can make your breasts swollen or painful. These changes happen when there is scar-like tissue (fibrous tissue) in the breasts or tiny sacs of fluid (cysts) form in the breast. This is a common condition. It does not mean that you have cancer. It usually happens because of hormone changes during a monthly period. ?What are the causes? ?The exact cause of this condition is not known. However, you are more likely to have it: ?If you have high levels of female hormones. ?If your mother had the same condition (inherited). ?What are the signs or symptoms? ?Symptoms of this condition include: ?Tenderness, swelling, mild discomfort, or pain. ?Rope-like tissue that can be felt when touching the breast. ?Lumps in one or both breasts. ?Changes in breast size. Breasts may get larger before your period and smaller after your period. ?Discharge from the nipple. ?How is this treated? ?In many cases, there is no treatment for this condition. In some cases, you may need treatment, including: ?Taking medicines. ?Avoiding caffeine. ?Reducing the amount of sugar and fat in your diet. ?You may also have: ?A procedure to remove fluid from a cyst. ?Surgery to remove a cyst that is large or tender or does not go away. ?Medicines that may lower female hormones in the body. ?Follow these instructions at home: ?Self care ?Check your breasts after every monthly period. If you do not have monthly periods, check your breasts on the first day of every month. Check for: ?Soreness. ?New swelling or puffiness. ?A change in breast size. ?A change in a lump that was already there. ?General instructions ?Take over-the-counter and prescription medicines only as told by your doctor. ?Wear a support or sports bra that fits well. Wear this support especially when you are exercising. ?If told by your doctor, avoid or have less caffeine, fat, and sugar in what you eat and drink. ?Keep  all follow-up visits as told by your doctor. This is important. ?Contact a doctor if: ?You have fluid coming from your nipple, especially if the fluid has blood in it. ?You have new lumps or bumps in your breast. ?Your breast gets puffy, red, and painful. ?You have changes in how your breast looks. ?Your nipple looks flat or it sinks into your breast. ?Get help right away if: ?Your breast turns red, and the redness is spreading. ?Summary ?Fibrocystic breast changes are changes that can make your breasts swollen or painful. ?This condition can happen when you have hormone changes during your monthly period. ?Check your breasts after every monthly period. If you do not have monthly periods, check your breasts on the first day of every month. ?This information is not intended to replace advice given to you by your health care provider. Make sure you discuss any questions you have with your health care provider. ?Document Revised: 10/09/2019 Document Reviewed: 10/09/2019 ?Elsevier Patient Education ? [place]. ? ?

## 2022-03-23 NOTE — Assessment & Plan Note (Signed)
Persistent ?Discussed a trial of Mirtazapine to help with depression, sleep and appetite but she would like to hold off at this time ?She will continue to follow with her therapist ?

## 2022-03-23 NOTE — Progress Notes (Signed)
? ?Subjective:  ? ? Patient ID: Kristen Ball, female    DOB: 12-09-1990, 31 y.o.   MRN: [id number] ? ?HPI ? ?Patient presents to clinic today with complaint of left breast pain.  This started 1 month ago.  She describes the pain as achy. The pain is constant but varies in intensity. She denies changes in the skin or had any discharge from the nipple. Mammogram and ultrasound from 05/2021 reviewed. ? ?She also reports left side abdominal pain. This started a few months ago. She describes the pain as sharp and stabbing. She reports the pain is constant for 20 minutes and then it goes away without intervention. It does not necessarily occur in relation to food. She denies chest pain, shortness of breath, reflux, nausea, vomiting, constipation, diarrhea or blood in her stool.  She has not tried anything OTC for this. ? ?Of note, she scored a 18 on her PHQ-9 score.  She has a history of depression which is a chronic issue for her.  She is currently seeing a therapist.  She is not interested in medication management at this time.  She denies active SI/HI. ? ?Review of Systems ? ?   ?Past Medical History:  ?Diagnosis Date  ? Asthma   ? Breast mass   ? Chicken pox   ? Common migraine with intractable migraine 05/20/2016  ? Depression   ? Migraines   ? ? ?Current Outpatient Medications  ?Medication Sig Dispense Refill  ? celecoxib (CELEBREX) 200 MG capsule TAKE 1 CAPSULE(200 MG) BY MOUTH DAILY 90 capsule 3  ? gabapentin (NEURONTIN) 100 MG capsule Take 1 capsule (100 mg total) by mouth 2 (two) times daily. Start 1 capsule daily, increase by 1 cap every 2-3 days as tolerated up to 3 times a day, or may take 3 at once in evening. 180 capsule 1  ? methocarbamol (ROBAXIN) 500 MG tablet Take 1 tablet (500 mg total) by mouth at bedtime as needed for muscle spasms. 30 tablet 1  ? predniSONE (DELTASONE) 10 MG tablet Take 6 tabs with breakfast Day 1, 5 tabs Day 2, 4 tabs Day 3, 3 tabs Day 4, 2 tabs Day 5, 1 tab Day 6. 21 tablet 0  ?  topiramate (TOPAMAX) 25 MG tablet Take 1 tablet (25 mg total) by mouth at bedtime. 90 tablet 1  ? valACYclovir (VALTREX) 1000 MG tablet valacyclovir 1 gram tablet ? TAKE 1 TABLET BY MOUTH EVERY DAY    ? ?No current facility-administered medications for this visit.  ? ? ?Allergies  ?Allergen Reactions  ? Metronidazole Hives, Itching and Rash  ? ? ?Family History  ?Problem Relation Age of Onset  ? Arthritis Mother   ? Hypertension Father   ? Colon cancer Maternal Grandmother   ? Diabetes Paternal Grandmother   ? Hypertension Paternal Grandfather   ? Breast cancer Maternal Aunt   ? Migraines Neg Hx   ? ? ?Social History  ? ?Socioeconomic History  ? Marital status: Single  ?  Spouse name: Not on file  ? Number of children: 0  ? Years of education: Some coll  ? Highest education level: Not on file  ?Occupational History  ? Occupation: [employer] office  ?Tobacco Use  ? Smoking status: Every Day  ?  Types: Cigars  ? Smokeless tobacco: Never  ? Tobacco comments:  ?  1 cigar daily  ?Vaping Use  ? Vaping Use: Never used  ?Substance and Sexual Activity  ? Alcohol use: Yes  ?  Alcohol/week:  0.0 standard drinks  ?  Comment: moderate  ? Drug use: No  ? Sexual activity: Yes  ?  Birth control/protection: Implant  ?Other Topics Concern  ? Not on file  ?Social History Narrative  ? ** Merged History Encounter **  ?    ? Work or School: works at post office  ?   ? Home Situation: lives alone  ?    ? Spiritual Beliefs: none  ?   ? Lifestyle: no regular exercising; diet is poor  ?   ? Right-handed  ? Rare caffeine use, occasional soda  ?   ?   ? ?Social Determinants of Health  ? ?Financial Resource Strain: Not on file  ?Food Insecurity: Not on file  ?Transportation Needs: Not on file  ?Physical Activity: Not on file  ?Stress: Not on file  ?Social Connections: Not on file  ?Intimate Partner Violence: Not on file  ? ? ? ?Constitutional: Denies fever, malaise, fatigue, headache or abrupt weight changes.  ?Respiratory: Denies difficulty  breathing, shortness of breath, cough or sputum production.   ?Cardiovascular: Denies chest pain, chest tightness, palpitations or swelling in the hands or feet.  ?Gastrointestinal: Patient reports intermittent LUQ abdominal pain.  Denies bloating, constipation, diarrhea or blood in the stool.  ?GU: Denies urgency, frequency, pain with urination, burning sensation, blood in urine, odor or discharge. ?Musculoskeletal: Denies decrease in range of motion, difficulty with gait, muscle pain or joint pain and swelling.  ?Skin: Patient reports left breast pain.  Denies redness, rashes, lesions or ulcercations.  ?Neurological: Denies dizziness, difficulty with memory, difficulty with speech or problems with balance and coordination.  ?Psych: Patient has a history of depression.  Denies anxiety, SI/HI. ? ?No other specific complaints in a complete review of systems (except as listed in HPI above). ? ?Objective:  ? Physical Exam ? ? ?BP 102/64 (BP Location: Left Arm, Patient Position: Sitting, Cuff Size: Normal)   Pulse 80   Temp (!) 97.1 ?F (36.2 ?C) (Temporal)   Wt 106 lb (48.1 kg)   SpO2 100%   BMI 18.19 kg/m?  ? ?Wt Readings from Last 3 Encounters:  ?02/19/22 106 lb (48.1 kg)  ?01/12/22 106 lb (48.1 kg)  ?10/22/21 106 lb 6.4 oz (48.3 kg)  ? ? ?General: Appears her stated age, well developed, well nourished in NAD. ?Skin: Warm, dry and intact.  ?Cardiovascular: Normal rate and rhythm. S1,S2 noted.  No murmur, rubs or gallops noted.  ?Pulmonary/Chest: Normal effort and positive vesicular breath sounds. No respiratory distress. No wheezes, rales or ronchi noted.  ?Breast: Symmetrical.  Fibrocystic changes noted in the left breast.  No discrete mass noted.  No nipple discharge noted.  No adenopathy noted in the left axilla. ?Abdomen: Soft and mildly tender in the LUQ.  Normal abdominal sounds.  No distention or masses noted. ?Musculoskeletal: No difficulty with gait.  ?Neurological: Alert and oriented.  ?Psychiatric:  Mood and affect flat. Behavior is normal. Judgment and thought content normal.  ? ? ?BMET ?   ?Component Value Date/Time  ? NA 139 02/19/2022 1349  ? K 3.8 02/19/2022 1349  ? CL 102 02/19/2022 1349  ? CO2 27 02/19/2022 1349  ? GLUCOSE 76 02/19/2022 1349  ? BUN 8 02/19/2022 1349  ? CREATININE 0.68 02/19/2022 1349  ? CALCIUM 9.9 02/19/2022 1349  ? GFRNONAA >60 07/10/2018 0942  ? GFRAA >60 07/10/2018 0942  ? ? ?Lipid Panel  ?   ?Component Value Date/Time  ? CHOL 170 02/19/2022 1349  ?  TRIG 42 02/19/2022 1349  ? HDL 64 02/19/2022 1349  ? CHOLHDL 2.7 02/19/2022 1349  ? VLDL 6.8 02/04/2021 1116  ? [place] 94 02/19/2022 1349  ? ? ?CBC ?   ?Component Value Date/Time  ? WBC 8.3 02/19/2022 1349  ? RBC 4.30 02/19/2022 1349  ? HGB 13.0 02/19/2022 1349  ? HCT 39.6 02/19/2022 1349  ? PLT 218 02/19/2022 1349  ? MCV 92.1 02/19/2022 1349  ? MCH 30.2 02/19/2022 1349  ? MCHC 32.8 02/19/2022 1349  ? RDW 12.3 02/19/2022 1349  ? LYMPHSABS 1.7 07/10/2018 0942  ? MONOABS 0.4 07/10/2018 0942  ? EOSABS 0.1 07/10/2018 0942  ? BASOSABS 0.0 07/10/2018 0942  ? ? ?Hgb A1C ?Lab Results  ?Component Value Date  ? HGBA1C 5.5 02/19/2022  ? ? ? ? ? ? ?   ?Assessment & Plan:  ? ?Left Breast Pain: ? ?Likely due to fibrocystic changes ?Advised her to cut back on the caffeine ?No indication for repeat ultrasound or mammogram at this time ? ?LUQ Abdominal Pain: ? ?Exam benign ?No distinct abdominal pain today ?Advised her if it occurs again to try Tums OTC ? ?RTC in 5 months for follow-up of chronic conditions ? ? ?[name], NP ? ?

## 2022-03-24 MED ORDER — MIRTAZAPINE 7.5 MG PO TABS: 7.5000 mg | ORAL_TABLET | Freq: Every day | ORAL | 2 refills | Status: DC

## 2022-03-24 NOTE — Addendum Note (Signed)
Addended by: Ashley Royalty E on: 03/24/2022 01:05 PM ? ? Modules accepted: Orders ? ?

## 2022-04-08 ENCOUNTER — Encounter: Payer: Self-pay | Admitting: Internal Medicine

## 2022-04-09 NOTE — Telephone Encounter (Signed)
Please see the MyChart message reply(ies) for my assessment and plan.    This patient gave consent for this Medical Advice Message and is aware that it may result in a bill to [employer], as well as the possibility of receiving a bill for a co-payment or deductible. They are an established patient, but are not seeking medical advice exclusively about a problem treated during an in person or video visit in the last seven days. I did not recommend an in person or video visit within seven days of my reply.    I spent a total of 1 minutes cumulative time within 7 days through [employer].  [name], NP

## 2022-06-19 ENCOUNTER — Encounter: Payer: Self-pay | Admitting: Internal Medicine

## 2022-06-23 ENCOUNTER — Ambulatory Visit: Payer: Self-pay | Admitting: *Deleted

## 2022-06-23 NOTE — Telephone Encounter (Signed)
  Chief Complaint: abd pain Symptoms: off and on for weeks Frequency: several times a week and last a day or two Pertinent Negatives: Patient denies vomiting, fever Disposition: '[]'$ ED /'[x]'$ Urgent Care (no appt availability in office) / '[]'$ Appointment(In office/virtual)/ '[]'$  [place] [facility]/ '[]'$ Home Care/ '[]'$ Refused Recommended Disposition /'[]'$ [place] [facility]/ '[]'$  Follow-up with PCP Additional Notes: Pt called stating her abd pain is worse today than usual. States it has come and gone for weeks. She did have a BM and pass gas yesterday. The pain moves all around abd, abd tight. Pt advised to go to ED/UC and made appt for follow up for Friday.   Reason for Disposition  [1] MODERATE pain (e.g., interferes with normal activities) AND [2] pain comes and goes (cramps) AND [3] present > 24 hours  (Exception: Pain with Vomiting or Diarrhea - see that Guideline.)  Answer Assessment - Initial Assessment Questions 1. LOCATION: "Where does it hurt?"      Moves from side to side now. 2. RADIATION: "Does the pain shoot anywhere else?" (e.g., chest, back)     Around abd 3. ONSET: "When did the pain begin?" (e.g., minutes, hours or days ago)      More than a week but comes and goes some days, had Friday and now again on Tuesday  4. : "Gradual or sudden onset?"     Constant but gets worse 5. PATTERN "Does the pain come and go, or is it constant?"    - If it comes and goes: "How long does it last?" "Do you have pain now?"     (Note: Comes and goes means the pain is intermittent. It goes away completely between bouts.)    - If constant: "Is it getting better, staying the same, or getting worse?"      (Note: Constant means the pain never goes away completely; most serious pain is constant and gets worse.)      Constant pain but worsens 6. SEVERITY: "How bad is the pain?"  (e.g., Scale 1-10; mild, moderate, or severe)    - MILD (1-3): Doesn't interfere with normal activities, abdomen soft and not  tender to touch.     - MODERATE (4-7): Interferes with normal activities or awakens from sleep, abdomen tender to touch.     - SEVERE (8-10): Excruciating pain, doubled over, unable to do any normal activities.       9 7. RECURRENT SYMPTOM: "Have you ever had this type of stomach pain before?" If Yes, ask: "When was the last time?" and "What happened that time?"      Has been for weeks 8. CAUSE: "What do you think is causing the stomach pain?"     Unsure, does not think it is gas 9. RELIEVING/AGGRAVATING FACTORS: "What makes it better or worse?" (e.g., antacids, bending or twisting motion, bowel movement)     now 10. OTHER SYMPTOMS: "Do you have any other symptoms?" (e.g., back pain, diarrhea, fever, urination pain, vomiting)       no 11. PREGNANCY: "Is there any chance you are pregnant?" "When was your last menstrual period?"       no  Protocols used: Abdominal Pain - [facility]

## 2022-06-26 ENCOUNTER — Ambulatory Visit: Payer: PRIVATE HEALTH INSURANCE | Admitting: Family Medicine

## 2022-06-26 ENCOUNTER — Encounter: Payer: Self-pay | Admitting: Family Medicine

## 2022-06-26 ENCOUNTER — Ambulatory Visit
Admission: RE | Admit: 2022-06-26 | Discharge: 2022-06-26 | Disposition: A | Discharge status: Home / Self Care | Payer: PRIVATE HEALTH INSURANCE | Attending: Family Medicine | Admitting: Family Medicine

## 2022-06-26 ENCOUNTER — Ambulatory Visit
Admission: RE | Admit: 2022-06-26 | Discharge: 2022-06-26 | Disposition: A | Discharge status: Home / Self Care | Payer: PRIVATE HEALTH INSURANCE | Source: Ambulatory Visit | Attending: Family Medicine | Admitting: Family Medicine

## 2022-06-26 VITALS — BP 112/72 | HR 70 | Ht 64.0 in | Wt 103.8 lb

## 2022-06-26 DIAGNOSIS — R109 Unspecified abdominal pain: Secondary | ICD-10-CM

## 2022-06-26 DIAGNOSIS — R1032 Left lower quadrant pain: Secondary | ICD-10-CM

## 2022-06-26 DIAGNOSIS — K5900 Constipation, unspecified: Secondary | ICD-10-CM

## 2022-06-26 MED ORDER — DICYCLOMINE HCL 10 MG PO CAPS: 10.0000 mg | ORAL_CAPSULE | Freq: Three times a day (TID) | ORAL | 0 refills | Status: DC

## 2022-06-26 NOTE — Progress Notes (Signed)
Subjective:    Patient ID: Kristen Ball, female    DOB: 02/06/1991, 31 y.o.   MRN: [id number]  Kristen Ball is a 31 y.o. female presenting on 06/26/2022 for Abdominal Pain  PCP [name], FNP   HPI  Left Lower Abdominal Pain, chronic Describes chronic problem for past 4-6 months approx, she can tell when it is coming on, and she feels sharp persistent pain steady for 20 minutes, within that time she notes if she moves a certain way it can trigger some worsening pain. Bending forward and putting pressure on it helps. Occasionally radiates to the R side. But usually stays on L side. - She said it feels like gas pains to her. - Not involving her appetite, not causing nausea vomiting diarrhea - She has 1 BM each day usually regularly. She has had some constipation in the past. - She says caffeine, nicotine helps move her bowels.  - Prior history of Colonoscopy age 49. No prior imaging.      06/26/2022    8:46 AM 03/23/2022    8:53 AM 02/19/2022    1:41 PM  Depression screen PHQ 2/9  Decreased Interest '2 3 3  '$ Down, Depressed, Hopeless '3 3 2  '$ PHQ - 2 Score '5 6 5  '$ Altered sleeping '2 1 3  '$ Tired, decreased energy '2 2 3  '$ Change in appetite '1 1 3  '$ Feeling bad or failure about yourself  '2 3 3  '$ Trouble concentrating 0 0 0  Moving slowly or fidgety/restless 0 0 0  Suicidal thoughts '2 3 1  '$ PHQ-9 Score '14 16 18  '$ Difficult doing work/chores Extremely dIfficult Extremely dIfficult Somewhat difficult    Social History   Tobacco Use   Smoking status: Every Day    Types: Cigars   Smokeless tobacco: Never   Tobacco comments:    1 cigar daily  Vaping Use   Vaping Use: Never used  Substance Use Topics   Alcohol use: Yes    Alcohol/week: 0.0 standard drinks of alcohol    Comment: moderate   Drug use: No    Review of Systems Per HPI unless specifically indicated above     Objective:    BP 112/72   Pulse 70   Ht '5\' 4"'$  (1.626 m)   Wt 103 lb 12.8 oz (47.1 kg)   SpO2 100%    BMI 17.82 kg/m   Wt Readings from Last 3 Encounters:  06/26/22 103 lb 12.8 oz (47.1 kg)  03/23/22 106 lb (48.1 kg)  02/19/22 106 lb (48.1 kg)    Physical Exam Vitals and nursing note reviewed.  Constitutional:      General: She is not in acute distress.    Appearance: She is well-developed. She is not diaphoretic.     Comments: Well-appearing, comfortable, cooperative  HENT:     Head: Normocephalic and atraumatic.  Eyes:     General:        Right eye: No discharge.        Left eye: No discharge.     Conjunctiva/sclera: Conjunctivae normal.  Neck:     Thyroid: No thyromegaly.  Cardiovascular:     Rate and Rhythm: Normal rate and regular rhythm.     Heart sounds: Normal heart sounds. No murmur heard. Pulmonary:     Effort: Pulmonary effort is normal. No respiratory distress.     Breath sounds: Normal breath sounds. No wheezing or rales.  Abdominal:     General: Abdomen is flat. Bowel sounds are  increased. There is no distension.     Palpations: Abdomen is soft. There is no mass.     Tenderness: There is no abdominal tenderness.  Musculoskeletal:        General: Normal range of motion.     Cervical back: Normal range of motion and neck supple.  Lymphadenopathy:     Cervical: No cervical adenopathy.  Skin:    General: Skin is warm and dry.     Findings: No erythema or rash.  Neurological:     Mental Status: She is alert and oriented to person, place, and time.  Psychiatric:        Behavior: Behavior normal.     Comments: Well groomed, good eye contact, normal speech and thoughts    Results for orders placed or performed in visit on 02/25/22  HM PAP SMEAR  Result Value Ref Range   HM Pap smear Atypical Squamous Cells   Results Console HPV  Result Value Ref Range   CHL HPV Positive    I have personally reviewed the radiology report from 06/26/22 on X-ray KUB.  CLINICAL DATA:  Left lower quadrant pain for 6 months.   EXAM: ABDOMEN - 1 VIEW   COMPARISON:  None  Available.   FINDINGS: Small, indeterminate metallic density projects over the left lower quadrant of the abdomen measuring 7 mm. This is of uncertain significance but may be external to the patient. The bowel gas pattern appears normal. No dilated loops of large or small bowel. No abnormal calcifications noted.   IMPRESSION: 1. Normal bowel gas pattern. 2. Small metallic density projects over the left lower quadrant of the abdomen. This is of uncertain significance but may be external to the patient.     Electronically Signed   By: Kerby Moors M.D.   On: 06/26/2022 10:18     Assessment & Plan:   Problem List Items Addressed This Visit   None Visit Diagnoses     LLQ abdominal pain    -  Primary   Relevant Medications   dicyclomine (BENTYL) 10 MG capsule   Other Relevant Orders   DG Abd 1 View (Completed)   Abdominal cramping       Relevant Medications   dicyclomine (BENTYL) 10 MG capsule   Constipation, unspecified constipation type       Relevant Medications   dicyclomine (BENTYL) 10 MG capsule       LLQ Abdominal pain, episodic, chronic History most suggestive of functional bowel problem, such as constipation May have IBS given history of problem No acute abdomen concerns Reassuring exam, hypermobile bowel sounds Irregular bowel habits with reported constipation  STAT X-ray today, stay tuned for results.  For prevention - try OTC Peppermint Oil (Triple Coated Capsule) '180mg'$  take one 3 times daily to reduce diarrhea  Try Dicyclomine as needed for the pain and cramping. To stop it to see if relief.  Called w X-ray, negative. Small metallic density is consistent with her body piercing.  Meds ordered this encounter  Medications   dicyclomine (BENTYL) 10 MG capsule    Sig: Take 1 capsule (10 mg total) by mouth 4 (four) times daily -  before meals and at bedtime.    Dispense:  30 capsule    Refill:  0      Follow up plan: Return if symptoms worsen or  fail to improve.   Nobie Putnam, [facility] 06/26/2022, 9:06 AM

## 2022-06-26 NOTE — Patient Instructions (Addendum)
Thank you for coming to the office today.  STAT X-ray today, stay tuned for results.  For prevention - try OTC Peppermint Oil (Triple Coated Capsule) '180mg'$  take one 3 times daily to reduce diarrhea  Try Dicyclomine as needed for the pain and cramping. To stop it to see if relief.  ---------------------------  For Constipation (less frequent bowel movement that can be hard dry or involve straining).  Recommend trying OTC Miralax 17g = 1 capful in large glass water once daily for now, try several days to see if working, goal is soft stool or BM 1-2 times daily, if too loose then reduce dose or try every other day. If not effective may need to increase it to 2 doses at once in AM or may do 1 in morning and 1 in afternoon/evening  - This medicine is very safe and can be used often without any problem and will not make you dehydrated. It is good for use on AS NEEDED BASIS or even MAINTENANCE therapy for longer term for several days to weeks at a time to help regulate bowel movements  Other more natural remedies or preventative treatment: - Increase hydration with water - Increase fiber in diet (high fiber foods = vegetables, leafy greens, oats/grains) - May take OTC Fiber supplement (metamucil powder or pill/gummy) - May try OTC Probiotic  -----  If still not successful we can consider CT or GI consultation.   Please schedule a Follow-up Appointment to: Return if symptoms worsen or fail to improve.  If you have any other questions or concerns, please feel free to call the office or send a message through [place]. You may also schedule an earlier appointment if necessary.  Additionally, you may be receiving a survey about your experience at our office within a few days to 1 week by e-mail or mail. We value your feedback.  Nobie Putnam, DO [place]

## 2022-07-29 ENCOUNTER — Encounter: Payer: Self-pay | Admitting: Family Medicine

## 2022-07-31 ENCOUNTER — Ambulatory Visit: Payer: Self-pay

## 2022-07-31 DIAGNOSIS — R1030 Lower abdominal pain, unspecified: Secondary | ICD-10-CM

## 2022-07-31 NOTE — Telephone Encounter (Signed)
    Chief Complaint: Abdominal pain Symptoms:  Frequency: ongoing Pertinent Negatives: Patient denies  Disposition: '[]'$ ED /'[]'$ Urgent Care (no appt availability in office) / '[]'$ Appointment(In office/virtual)/ '[]'$  [place] [facility]/ '[]'$ Home Care/ '[]'$ Refused Recommended Disposition /'[]'$ [place] [facility]/ '[x]'$  Follow-up with PCP Additional Notes: PT was seen for this pain recently and x-rayed. PT still has pain. Medicine is ineffective. Pt would like an [place]. Pt thinks that pain may be ovarian cysts. Pt also sent note via MyChart.  Please advise.  Note from pt below:  Hello,   I wanted to reach out to you since I seen you last for my stomach pains. I've still been having them & recently I had the pain on both my right and left side at different times 5 min apart. The pain still only lasted about 25 min, however, when the pain on the right came it brought it to tears! It was worse than the left side. I also took the medicine and didn't feel it helped. I'm wondering if it could possibly be a cyst on my ovaries that could be causing this pain. If so, is it a way to order a ultrasound for me to have done without coming back in for a visit?    Reason for Disposition  Abdominal pain is a chronic symptom (recurrent or ongoing AND present > 4 weeks)  Answer Assessment - Initial Assessment Questions 1. LOCATION: "Where does it hurt?"      Abdomen both left and right side 2. RADIATION: "Does the pain shoot anywhere else?" (e.g., chest, back)      3. ONSET: "When did the pain begin?" (e.g., minutes, hours or days ago)       4. SUDDEN: "Gradual or sudden onset?"      5. PATTERN "Does the pain come and go, or is it constant?"    - If it comes and goes: "How long does it last?" "Do you have pain now?"     (Note: Comes and goes means the pain is intermittent. It goes away completely between bouts.)    - If constant: "Is it getting better, staying the same, or getting worse?"      (Note: Constant  means the pain never goes away completely; most serious pain is constant and gets worse.)      intermittent 6. SEVERITY: "How bad is the pain?"  (e.g., Scale 1-10; mild, moderate, or severe)    - MILD (1-3): Doesn't interfere with normal activities, abdomen soft and not tender to touch.     - MODERATE (4-7): Interferes with normal activities or awakens from sleep, abdomen tender to touch.     - SEVERE (8-10): Excruciating pain, doubled over, unable to do any normal activities.        7. RECURRENT SYMPTOM: "Have you ever had this type of stomach pain before?" If Yes, ask: "When was the last time?" and "What happened that time?"      yes 8. CAUSE: "What do you think is causing the stomach pain?"     unsure 9. RELIEVING/AGGRAVATING FACTORS: "What makes it better or worse?" (e.g., antacids, bending or twisting motion, bowel movement)      10. OTHER SYMPTOMS: "Do you have any other symptoms?" (e.g., back pain, diarrhea, fever, urination pain, vomiting)        11. PREGNANCY: "Is there any chance you are pregnant?" "When was your last menstrual period?"  Protocols used: Abdominal Pain - [facility]

## 2022-07-31 NOTE — Telephone Encounter (Signed)
I will route this back to PCP [name], FNP to collaborate on next plan. She is out of office at this time, and it is end of day on Friday. We should be able to arrange a plan for her starting next week.  Kristen Ball, I saw this patient of yours on 06/26/22 as an acute visit but her abdominal pain had been present for >6 months.  At that time history and exam suggested functional bowel problem related to IBS / constipation.  She had KUB negative. Various treatments suggested for bowels.  Now it seems like a month later limited success. She is asking to follow-up with Pelvic Ultrasound for questions of ovarian cyst.  Please let me know how you prefer to manage this longer term. I am happy to order the transvaginal pelvic US for her otherwise - if you prefer to do an exam first and then order imaging or prefer GYN initially that is fine. I can assist with orders.  Nobie Putnam, [facility] 07/31/2022, 4:38 PM

## 2022-08-01 NOTE — Addendum Note (Signed)
Addended by: Jearld Fenton on: 08/01/2022 08:29 AM   Modules accepted: Orders

## 2022-08-01 NOTE — Telephone Encounter (Signed)
I have ordered pelvic ultrasound for further evaluation. Recommend she follow up with me in office if symptoms persist or worsen

## 2022-08-04 NOTE — Telephone Encounter (Signed)
Please see telephone documentation on this issue, and PCP [name], FNP has ordered pelvic US already scheduled for 9/28  Nobie Putnam, [facility] 08/04/2022, 1:00 PM

## 2022-08-06 ENCOUNTER — Ambulatory Visit (HOSPITAL_BASED_OUTPATIENT_CLINIC_OR_DEPARTMENT_OTHER)
Admission: RE | Admit: 2022-08-06 | Discharge: 2022-08-06 | Disposition: A | Discharge status: Home / Self Care | Payer: PRIVATE HEALTH INSURANCE | Source: Ambulatory Visit | Attending: Internal Medicine | Admitting: Internal Medicine

## 2022-08-06 DIAGNOSIS — R1030 Lower abdominal pain, unspecified: Secondary | ICD-10-CM | POA: Diagnosis present

## 2022-08-06 DIAGNOSIS — R102 Pelvic and perineal pain: Secondary | ICD-10-CM | POA: Insufficient documentation

## 2022-08-07 ENCOUNTER — Encounter: Payer: Self-pay | Admitting: Internal Medicine

## 2022-08-07 ENCOUNTER — Telehealth (INDEPENDENT_AMBULATORY_CARE_PROVIDER_SITE_OTHER): Payer: PRIVATE HEALTH INSURANCE | Admitting: Internal Medicine

## 2022-08-07 DIAGNOSIS — M62830 Muscle spasm of back: Secondary | ICD-10-CM

## 2022-08-07 MED ORDER — METHOCARBAMOL 500 MG PO TABS: 500.0000 mg | ORAL_TABLET | Freq: Three times a day (TID) | ORAL | 2 refills | Status: DC | PRN

## 2022-08-07 NOTE — Progress Notes (Signed)
Virtual Visit via Video Note  I connected with Kristen Ball on 08/07/22 at 11:20 AM EDT by a video enabled telemedicine application and verified that I am speaking with the correct person using two identifiers.  Location: Patient: Work Provider: Office  Persons participating in this video call: [name], NP and Kristen Ball   I discussed the limitations of evaluation and management by telemedicine and the availability of in person appointments. The patient expressed understanding and agreed to proceed.  History of Present Illness:  Patient reports back spasms. She reports this started 11 days ago. She missed Tue-Thur last week and would like a work note for this.  She has been taking methocarbamol but she ran out and would like a refill of this today.  She recently started working [employer] at night and feels like the heavy lifting has caused an increase in her back spasms.  She denies any urinary or vaginal symptoms at this time.    Past Medical History:  Diagnosis Date   Asthma    Breast mass    Chicken pox    Common migraine with intractable migraine 05/20/2016   Depression    Migraines     Current Outpatient Medications  Medication Sig Dispense Refill   celecoxib (CELEBREX) 200 MG capsule TAKE 1 CAPSULE(200 MG) BY MOUTH DAILY 90 capsule 3   dicyclomine (BENTYL) 10 MG capsule Take 1 capsule (10 mg total) by mouth 4 (four) times daily -  before meals and at bedtime. 30 capsule 0   gabapentin (NEURONTIN) 100 MG capsule Take 1 capsule (100 mg total) by mouth 2 (two) times daily. Start 1 capsule daily, increase by 1 cap every 2-3 days as tolerated up to 3 times a day, or may take 3 at once in evening. 180 capsule 1   methocarbamol (ROBAXIN) 500 MG tablet Take 1 tablet (500 mg total) by mouth at bedtime as needed for muscle spasms. 30 tablet 1   mirtazapine (REMERON) 7.5 MG tablet Take 1 tablet (7.5 mg total) by mouth at bedtime. 30 tablet 2   topiramate (TOPAMAX) 25 MG tablet  Take 1 tablet (25 mg total) by mouth at bedtime. 90 tablet 1   valACYclovir (VALTREX) 1000 MG tablet valacyclovir 1 gram tablet  TAKE 1 TABLET BY MOUTH EVERY DAY     No current facility-administered medications for this visit.    Allergies  Allergen Reactions   Metronidazole Hives, Itching and Rash    Family History  Problem Relation Age of Onset   Arthritis Mother    Hypertension Father    Colon cancer Maternal Grandmother    Diabetes Paternal Grandmother    Hypertension Paternal Grandfather    Breast cancer Maternal Aunt    Migraines Neg Hx     Social History   Socioeconomic History   Marital status: Single    Spouse name: Not on file   Number of children: 0   Years of education: Some coll   Highest education level: Not on file  Occupational History   Occupation: Post office  Tobacco Use   Smoking status: Every Day    Types: Cigars   Smokeless tobacco: Never   Tobacco comments:    1 cigar daily  Vaping Use   Vaping Use: Never used  Substance and Sexual Activity   Alcohol use: Yes    Alcohol/week: 0.0 standard drinks of alcohol    Comment: moderate   Drug use: No   Sexual activity: Yes    Birth control/protection: Implant  Other Topics Concern   Not on file  Social History Narrative   ** Merged History Encounter **       Work or School: works at post office      Home Situation: lives alone       Spiritual Beliefs: none      Lifestyle: no regular exercising; diet is poor      Right-handed   Rare caffeine use, occasional soda         Social Determinants of Radio broadcast assistant Strain: Not on file  Food Insecurity: Not on file  Transportation Needs: Not on file  Physical Activity: Not on file  Stress: Not on file  Social Connections: Not on file  Intimate Partner Violence: Not on file     Constitutional: Denies fever, malaise, fatigue, headache or abrupt weight changes.  Respiratory: Denies difficulty breathing, shortness of breath,  cough or sputum production.   Cardiovascular: Denies chest pain, chest tightness, palpitations or swelling in the hands or feet.  Gastrointestinal: Denies abdominal pain, bloating, constipation, diarrhea or blood in the stool.  GU: Denies urgency, frequency, pain with urination, burning sensation, blood in urine, odor or discharge. Musculoskeletal: Patient reports back spasms.  Denies decrease in range of motion, difficulty with gait, or joint pain and swelling.   No other specific complaints in a complete review of systems (except as listed in HPI above).  Observations/Objective:  There were no vitals taken for this visit.   General: Appears her stated age, well developed, well nourished in NAD. Pulmonary/Chest: Normal effort. No respiratory distress. Neurological: Alert and oriented.   BMET    Component Value Date/Time   NA 139 02/19/2022 1349   K 3.8 02/19/2022 1349   CL 102 02/19/2022 1349   CO2 27 02/19/2022 1349   GLUCOSE 76 02/19/2022 1349   BUN 8 02/19/2022 1349   CREATININE 0.68 02/19/2022 1349   CALCIUM 9.9 02/19/2022 1349   GFRNONAA >60 07/10/2018 0942   GFRAA >60 07/10/2018 0942    Lipid Panel     Component Value Date/Time   CHOL 170 02/19/2022 1349   TRIG 42 02/19/2022 1349   HDL 64 02/19/2022 1349   CHOLHDL 2.7 02/19/2022 1349   VLDL 6.8 02/04/2021 1116   LDLCALC 94 02/19/2022 1349    CBC    Component Value Date/Time   WBC 8.3 02/19/2022 1349   RBC 4.30 02/19/2022 1349   HGB 13.0 02/19/2022 1349   HCT 39.6 02/19/2022 1349   PLT 218 02/19/2022 1349   MCV 92.1 02/19/2022 1349   MCH 30.2 02/19/2022 1349   MCHC 32.8 02/19/2022 1349   RDW 12.3 02/19/2022 1349   LYMPHSABS 1.7 07/10/2018 0942   MONOABS 0.4 07/10/2018 0942   EOSABS 0.1 07/10/2018 0942   BASOSABS 0.0 07/10/2018 0942    Hgb A1C Lab Results  Component Value Date   HGBA1C 5.5 02/19/2022        Assessment and Plan:  Muscle Spasms of Back:  Encouraged heat and  stretching Methocarbamol refilled Work note provided  Schedule an appointment in 1 month for follow-up of chronic conditions Follow Up Instructions:    I discussed the assessment and treatment plan with the patient. The patient was provided an opportunity to ask questions and all were answered. The patient agreed with the plan and demonstrated an understanding of the instructions.   The patient was advised to call back or seek an in-person evaluation if the symptoms worsen or if the condition fails to  improve as anticipated.    [name], NP

## 2022-10-22 ENCOUNTER — Encounter (HOSPITAL_BASED_OUTPATIENT_CLINIC_OR_DEPARTMENT_OTHER): Payer: Self-pay | Admitting: Obstetrics and Gynecology

## 2022-10-22 ENCOUNTER — Other Ambulatory Visit: Payer: Self-pay

## 2022-10-22 NOTE — Progress Notes (Signed)
Your procedure is scheduled on Friday, 10/30/22.  Report to [facility].   Call this number if you have problems the morning of surgery  :[phone].   OUR ADDRESS IS [address].  WE ARE LOCATED IN [facility].  PLEASE BRING YOUR INSURANCE CARD AND PHOTO ID DAY OF SURGERY.  ONLY 2 PEOPLE ARE ALLOWED IN  WAITING  ROOM.                                      REMEMBER:  DO NOT EAT FOOD, CANDY GUM OR MINTS  AFTER MIDNIGHT THE NIGHT BEFORE YOUR SURGERY . YOU MAY HAVE CLEAR LIQUIDS FROM MIDNIGHT THE NIGHT BEFORE YOUR SURGERY UNTIL  4:30 AM. NO CLEAR LIQUIDS AFTER   4:30 AM DAY OF SURGERY.  YOU MAY  BRUSH YOUR TEETH MORNING OF SURGERY AND RINSE YOUR MOUTH OUT, NO CHEWING GUM CANDY OR MINTS.     CLEAR LIQUID DIET   Foods Allowed                                                                     Foods Excluded  Coffee and tea, regular and decaf                             liquids that you cannot  Plain Jell-O                                                                   see through such as: Fruit ices (not with fruit pulp)                                     milk, soups, orange juice  Plain  Popsicles                                    All solid food Carbonated beverages, regular and diet                                    Cranberry, grape and apple juices Sports drinks like Gatorade _____________________________________________________________________     TAKE ONLY THESE MEDICATIONS MORNING OF SURGERY: Valtrex, Gabapentin if needed, Robaxin if needed, Topamax if needed    UP TO 4 VISITORS  MAY VISIT IN THE EXTENDED RECOVERY ROOM UNTIL 800 PM ONLY.  ONE  VISITOR AGE 47 AND OVER MAY SPEND THE NIGHT AND MUST BE IN EXTENDED RECOVERY ROOM NO LATER THAN 800 PM . YOUR DISCHARGE TIME AFTER YOU SPEND THE NIGHT IS 900 AM THE MORNING AFTER YOUR SURGERY.  YOU MAY PACK A  SMALL OVERNIGHT BAG WITH TOILETRIES FOR YOUR OVERNIGHT STAY  IF YOU WISH.  YOUR PRESCRIPTION MEDICATIONS WILL BE PROVIDED DURING [place].                                      DO NOT WEAR JEWERLY, MAKE UP. DO NOT WEAR LOTIONS, POWDERS, PERFUMES OR NAIL POLISH ON YOUR FINGERNAILS. TOENAIL POLISH IS OK TO WEAR. DO NOT SHAVE FOR 48 HOURS PRIOR TO DAY OF SURGERY. MEN MAY SHAVE FACE AND NECK. CONTACTS, GLASSES, OR DENTURES MAY NOT BE WORN TO SURGERY.  REMEMBER: NO SMOKING, DRUGS OR ALCOHOL FOR 24 HOURS BEFORE YOUR SURGERY.                                    [facility] IS NOT RESPONSIBLE  FOR ANY BELONGINGS.                                                                    Marland Kitchen            - Preparing for Surgery Before surgery, you can play an important role.  Because skin is not sterile, your skin needs to be as free of germs as possible.  You can reduce the number of germs on your skin by washing with CHG (chlorahexidine gluconate) soap before surgery.  CHG is an antiseptic cleaner which kills germs and bonds with the skin to continue killing germs even after washing. Please DO NOT use if you have an allergy to CHG or antibacterial soaps.  If your skin becomes reddened/irritated stop using the CHG and inform your nurse when you arrive at Short Stay. Do not shave (including legs and underarms) for at least 48 hours prior to the first CHG shower.  You may shave your face/neck. Please follow these instructions carefully:  1.  Shower with CHG Soap the night before surgery and the  morning of Surgery.  2.  If you choose to wash your hair, wash your hair first as usual with your  normal  shampoo.  3.  After you shampoo, rinse your hair and body thoroughly to remove the  shampoo.                                        4.  Use CHG as you would any other liquid soap.  You can apply chg directly  to the skin and wash , chg soap provided, night before and morning of your surgery.  5.  Apply the CHG Soap to your body ONLY FROM THE NECK DOWN.   Do  not use on face/ open                           Wound or open sores. Avoid contact with eyes, ears mouth and genitals (private parts).                       Wash face,  Genitals (private parts) with your normal soap.             6.  Wash thoroughly, paying special attention to the area where your surgery  will be performed.  7.  Thoroughly rinse your body with warm water from the neck down.  8.  DO NOT shower/wash with your normal soap after using and rinsing off  the CHG Soap.             9.  Pat yourself dry with a clean towel.            10.  Wear clean pajamas.            11.  Place clean sheets on your bed the night of your first shower and do not  sleep with pets. Day of Surgery : Do not apply any lotions/deodorants the morning of surgery.  Please wear clean clothes to the hospital/surgery center.  IF YOU HAVE ANY SKIN IRRITATION OR PROBLEMS WITH THE SURGICAL SOAP, PLEASE GET A BAR OF GOLD DIAL SOAP AND SHOWER THE NIGHT BEFORE YOUR SURGERY AND THE MORNING OF YOUR SURGERY. PLEASE LET THE NURSE KNOW MORNING OF YOUR SURGERY IF YOU HAD ANY PROBLEMS WITH THE SURGICAL SOAP.   ________________________________________________________________________                                                        QUESTIONS [place] [facility] PRE OP NURSE PHONE [phone].

## 2022-10-22 NOTE — Progress Notes (Signed)
Spoke w/ via phone for pre-op interview---Kristen Ball needs dos----  urine pregnancy             Ball results------10/28/22 Ball appt for cbc, type & screen COVID test -----patient states asymptomatic no test needed Arrive at -------0530 on Friday, 10/30/22 NPO after MN NO Solid Food.  Clear liquids from MN until---0430 Med rec completed Medications to take morning of surgery -----Valtrex, Neurontin prn, Robaxin prn, Topamax prn Diabetic medication -----n/a Patient instructed no nail polish to be worn day of surgery Patient instructed to bring photo id and insurance card day of surgery Patient aware to have Driver (ride ) / caregiver    for 24 hours after surgery  Patient Special Instructions -----Extended / overnight stay instructions given. No smoking for 24 hours before surgery. Pre-Op special Istructions -----Patient stated that she has a piercing in her lower back that she cannot remove. Patient verbalized understanding of instructions that were given at this phone interview. Patient denies shortness of breath, chest pain, fever, cough at this phone interview.

## 2022-10-23 ENCOUNTER — Ambulatory Visit: Payer: PRIVATE HEALTH INSURANCE | Admitting: Internal Medicine

## 2022-10-23 ENCOUNTER — Encounter: Payer: Self-pay | Admitting: Internal Medicine

## 2022-10-23 VITALS — BP 108/66 | HR 75 | Temp 97.3°F | Wt 104.0 lb

## 2022-10-23 DIAGNOSIS — A6004 Herpesviral vulvovaginitis: Secondary | ICD-10-CM

## 2022-10-23 DIAGNOSIS — J4520 Mild intermittent asthma, uncomplicated: Secondary | ICD-10-CM

## 2022-10-23 DIAGNOSIS — M79641 Pain in right hand: Secondary | ICD-10-CM

## 2022-10-23 DIAGNOSIS — F329 Major depressive disorder, single episode, unspecified: Secondary | ICD-10-CM | POA: Diagnosis not present

## 2022-10-23 DIAGNOSIS — M79642 Pain in left hand: Secondary | ICD-10-CM

## 2022-10-23 DIAGNOSIS — G43C0 Periodic headache syndromes in child or adult, not intractable: Secondary | ICD-10-CM

## 2022-10-23 MED ORDER — FLUVOXAMINE MALEATE 25 MG PO TABS: 25.0000 mg | ORAL_TABLET | Freq: Every day | ORAL | 1 refills | Status: DC

## 2022-10-23 NOTE — Progress Notes (Signed)
Subjective:    Patient ID: Kristen Ball, female    DOB: February 11, 1991, 31 y.o.   MRN: [id number]  HPI  Patient presents to clinic today for follow-up of chronic conditions.  Migraines: These occur rarely.  Triggered by stress.  She takes Topamax as needed with some relief of symptoms.  She does not follow with neurology.  Asthma: Mild, intermittent.  She is not using any inhalers at this time.  There are no PFTs on file.  Depression: Chronic, but she is not currently taking any medications for this.  She was prescribed Mirtazapine but reports. She has failed Sertraline and Venlafaxine in the past.  She is not currently seeing a therapist.  She denies anxiety, SI/HI.  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  Chronic Hand Pain: Bilateral.  She takes Celebrex and Gabapentin as needed with some relief of symptoms.  She does not follow with orthopedics.  Review of Systems  Past Medical History:  Diagnosis Date   Arthritis    right hand   Asthma    resolved as of 10/22/22 per pt   Breast mass 2016   benign   Chicken pox    as a child   COVID-19 11/2020   COVID-19 02/09/2022   Headache & sore throat   Depression    Follows w/ [name], NP @ [facility].   Family history of adverse reaction to anesthesia    Mother has N & V with anesthesia.   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   Wears glasses     Current Outpatient Medications  Medication Sig Dispense Refill   celecoxib (CELEBREX) 200 MG capsule TAKE 1 CAPSULE(200 MG) BY MOUTH DAILY (Patient taking differently: as needed.) 90 capsule 3   gabapentin (NEURONTIN) 100 MG capsule Take 1 capsule (100 mg total) by mouth 2 (two) times daily. Start 1 capsule daily, increase by 1 cap every 2-3 days as tolerated up to 3 times a day, or may take 3 at  once in evening. (Patient taking differently: Take 100 mg by mouth as needed. Start 1 capsule daily, increase by 1 cap every 2-3 days as tolerated up to 3 times a day, or may take 3 at once in evening.) 180 capsule 1   methocarbamol (ROBAXIN) 500 MG tablet Take 1 tablet (500 mg total) by mouth every 8 (eight) hours as needed for muscle spasms. 30 tablet 2   mirtazapine (REMERON) 7.5 MG tablet Take 1 tablet (7.5 mg total) by mouth at bedtime. (Patient not taking: Reported on 10/22/2022) 30 tablet 2   topiramate (TOPAMAX) 25 MG tablet Take 1 tablet (25 mg total) by mouth at bedtime. (Patient taking differently: Take 25 mg by mouth as needed.) 90 tablet 1   valACYclovir (VALTREX) 1000 MG tablet valacyclovir 1 gram tablet  TAKE 1 TABLET BY MOUTH EVERY DAY     No current facility-administered medications for this visit.    Allergies  Allergen Reactions   Metronidazole Hives, Itching and Rash    Family History  Problem Relation Age of Onset   Arthritis Mother    Hypertension Father    Colon cancer Maternal Grandmother    Diabetes Paternal Grandmother    Hypertension Paternal Grandfather    Breast cancer Maternal Aunt    Migraines Neg Hx  Social History   Socioeconomic History   Marital status: Single    Spouse name: Not on file   Number of children: 0   Years of education: Some coll   Highest education level: Not on file  Occupational History   Occupation: Post office  Tobacco Use   Smoking status: Every Day    Types: Cigars   Smokeless tobacco: Never   Tobacco comments:    1 cigar daily  Vaping Use   Vaping Use: Never used  Substance and Sexual Activity   Alcohol use: Yes    Comment: very rarely   Drug use: No   Sexual activity: Yes    Birth control/protection: None  Other Topics Concern   Not on file  Social History Narrative   ** Merged History Encounter **       Work or School: works at post office      Home Situation: lives alone       Spiritual Beliefs:  none      Lifestyle: no regular exercising; diet is poor      Right-handed   Rare caffeine use, occasional soda         Social Determinants of Radio broadcast assistant Strain: Not on file  Food Insecurity: Not on file  Transportation Needs: Not on file  Physical Activity: Not on file  Stress: Not on file  Social Connections: Not on file  Intimate Partner Violence: Not on file     Constitutional: Patient reports intermittent headaches.  Denies fever, malaise, fatigue, or abrupt weight changes.  HEENT: Denies eye pain, eye redness, ear pain, ringing in the ears, wax buildup, runny nose, nasal congestion, bloody nose, or sore throat. Respiratory: Denies difficulty breathing, shortness of breath, cough or sputum production.   Cardiovascular: Denies chest pain, chest tightness, palpitations or swelling in the hands or feet.  Gastrointestinal: Denies abdominal pain, bloating, constipation, diarrhea or blood in the stool.  GU: Denies urgency, frequency, pain with urination, burning sensation, blood in urine, odor or discharge. Musculoskeletal: Patient reports bilateral hand pain.  Denies decrease in range of motion, difficulty with gait, muscle pain or joint swelling.  Skin: Denies redness, rashes, lesions or ulcercations.  Neurological: Denies dizziness, difficulty with memory, difficulty with speech or problems with balance and coordination.  Psych: Patient has a history of depression.  Denies anxiety, SI/HI.  No other specific complaints in a complete review of systems (except as listed in HPI above).     Objective:   Physical Exam   LMP 10/04/2022 (Exact Date)  Wt Readings from Last 3 Encounters:  06/26/22 103 lb 12.8 oz (47.1 kg)  03/23/22 106 lb (48.1 kg)  02/19/22 106 lb (48.1 kg)    General: Appears their stated age, well developed, well nourished in NAD. Skin: Warm, dry and intact. No rashes, lesions or ulcerations noted. HEENT: Head: normal shape and size; Eyes:  sclera white, no icterus, conjunctiva pink, PERRLA and EOMs intact; Ears: Tm's gray and intact, normal light reflex; Nose: mucosa pink and moist, septum midline; Throat/Mouth: Teeth present, mucosa pink and moist, no exudate, lesions or ulcerations noted.  Neck:  Neck supple, trachea midline. No masses, lumps or thyromegaly present.  Cardiovascular: Normal rate and rhythm. S1,S2 noted.  No murmur, rubs or gallops noted. No JVD or BLE edema. No carotid bruits noted. Pulmonary/Chest: Normal effort and positive vesicular breath sounds. No respiratory distress. No wheezes, rales or ronchi noted.  Abdomen: Soft and nontender. Normal bowel sounds. No distention  or masses noted. Liver, spleen and kidneys non palpable. Musculoskeletal: Normal range of motion. No signs of joint swelling. No difficulty with gait.  Neurological: Alert and oriented. Cranial nerves II-XII grossly intact. Coordination normal.  Psychiatric: Mood and affect normal. Behavior is normal. Judgment and thought content normal.     BMET    Component Value Date/Time   NA 139 02/19/2022 1349   K 3.8 02/19/2022 1349   CL 102 02/19/2022 1349   CO2 27 02/19/2022 1349   GLUCOSE 76 02/19/2022 1349   BUN 8 02/19/2022 1349   CREATININE 0.68 02/19/2022 1349   CALCIUM 9.9 02/19/2022 1349   GFRNONAA >60 07/10/2018 0942   GFRAA >60 07/10/2018 0942    Lipid Panel     Component Value Date/Time   CHOL 170 02/19/2022 1349   TRIG 42 02/19/2022 1349   HDL 64 02/19/2022 1349   CHOLHDL 2.7 02/19/2022 1349   VLDL 6.8 02/04/2021 1116   LDLCALC 94 02/19/2022 1349    CBC    Component Value Date/Time   WBC 8.3 02/19/2022 1349   RBC 4.30 02/19/2022 1349   HGB 13.0 02/19/2022 1349   HCT 39.6 02/19/2022 1349   PLT 218 02/19/2022 1349   MCV 92.1 02/19/2022 1349   MCH 30.2 02/19/2022 1349   MCHC 32.8 02/19/2022 1349   RDW 12.3 02/19/2022 1349   LYMPHSABS 1.7 07/10/2018 0942   MONOABS 0.4 07/10/2018 0942   EOSABS 0.1 07/10/2018 0942    BASOSABS 0.0 07/10/2018 0942    Hgb A1C Lab Results  Component Value Date   HGBA1C 5.5 02/19/2022           Assessment & Plan:      RTC in 6 months for your annual exam [name], NP

## 2022-10-23 NOTE — Assessment & Plan Note (Signed)
Currently not an issue 

## 2022-10-23 NOTE — Assessment & Plan Note (Signed)
Continue Valcyclovir

## 2022-10-23 NOTE — Patient Instructions (Signed)

## 2022-10-23 NOTE — Assessment & Plan Note (Signed)
Will trial fluvoxamine 25 mg  Support offered

## 2022-10-23 NOTE — Assessment & Plan Note (Signed)
Continue Topamax as needed

## 2022-10-23 NOTE — Assessment & Plan Note (Signed)
Continue celebrex and gabapentin as needed

## 2022-10-28 ENCOUNTER — Encounter (HOSPITAL_COMMUNITY)
Admission: RE | Admit: 2022-10-28 | Discharge: 2022-10-28 | Disposition: A | Discharge status: Home / Self Care | Payer: PRIVATE HEALTH INSURANCE | Source: Ambulatory Visit | Attending: Obstetrics and Gynecology | Admitting: Obstetrics and Gynecology

## 2022-10-28 DIAGNOSIS — D259 Leiomyoma of uterus, unspecified: Secondary | ICD-10-CM | POA: Insufficient documentation

## 2022-10-28 DIAGNOSIS — Z01812 Encounter for preprocedural laboratory examination: Secondary | ICD-10-CM | POA: Diagnosis present

## 2022-10-28 LAB — CBC
HCT: 34.9 % — ABNORMAL LOW (ref 36.0–46.0)
Hemoglobin: 11.4 g/dL — ABNORMAL LOW (ref 12.0–15.0)
MCH: 30.2 pg (ref 26.0–34.0)
MCHC: 32.7 g/dL (ref 30.0–36.0)
MCV: 92.6 fL (ref 80.0–100.0)
Platelets: 179 10*3/uL (ref 150–400)
RBC: 3.77 MIL/uL — ABNORMAL LOW (ref 3.87–5.11)
RDW: 12.1 % (ref 11.5–15.5)
WBC: 5.7 10*3/uL (ref 4.0–10.5)
nRBC: 0 % (ref 0.0–0.2)

## 2022-10-29 ENCOUNTER — Encounter (HOSPITAL_BASED_OUTPATIENT_CLINIC_OR_DEPARTMENT_OTHER): Payer: Self-pay | Admitting: Obstetrics and Gynecology

## 2022-10-29 NOTE — Anesthesia Preprocedure Evaluation (Addendum)
Anesthesia Evaluation  Patient identified by MRN, date of birth, ID band Patient awake    Reviewed: Allergy & Precautions, NPO status , Patient's Chart, lab work & pertinent test results, reviewed documented beta blocker date and time   History of Anesthesia Complications (+) Family history of anesthesia reaction  Airway Mallampati: II       Dental no notable dental hx. (+) Teeth Intact, Dental Advisory Given   Pulmonary asthma , Current Smoker and Patient abstained from smoking. Hx/o + PPD 2019, CXR negative   Pulmonary exam normal breath sounds clear to auscultation       Cardiovascular negative cardio ROS Normal cardiovascular exam Rhythm:Regular Rate:Normal     Neuro/Psych  Headaches PSYCHIATRIC DISORDERS  Depression       GI/Hepatic negative GI ROS, Neg liver ROS,,,  Endo/Other  negative endocrine ROS    Renal/GU negative Renal ROS  negative genitourinary   Musculoskeletal  (+) Arthritis ,  Arthritis hand Eczema   Abdominal   Peds  Hematology negative hematology ROS (+)   Anesthesia Other Findings   Reproductive/Obstetrics Uterine leiomyoma HSV                              Anesthesia Physical Anesthesia Plan  ASA: 2  Anesthesia Plan: General   Post-op Pain Management: Precedex and Tylenol PO (pre-op)*   Induction: Intravenous  PONV Risk Score and Plan: 4 or greater and Treatment may vary due to age or medical condition, Scopolamine patch - Pre-op, Midazolam, Dexamethasone and Ondansetron  Airway Management Planned: Oral ETT  Additional Equipment: None  Intra-op Plan:   Post-operative Plan: Extubation in OR  Informed Consent: I have reviewed the patients History and Physical, chart, labs and discussed the procedure including the risks, benefits and alternatives for the proposed anesthesia with the patient or authorized representative who has indicated his/her  understanding and acceptance.     Dental advisory given  Plan Discussed with: CRNA and Anesthesiologist  Anesthesia Plan Comments:         Anesthesia Quick Evaluation

## 2022-10-29 NOTE — H&P (Signed)
Gynecology History and Physical Preadmission H&P for schedule procedure  Kristen Ball is a 31 y.o. female [place] presenting for robotic assisted laparoscopic myomectomy.  She has pelvic pain that is suspected to be secondary to a pedunculated uterine fibroid.  The pain is severe and not responsive to supportive measures.  Her history is notable for depression and occasional smoker. She has no history of intra-abdominal surgery.    OB History     Gravida  0   Para  0   Term  0   Preterm  0   AB  0   Living         SAB  0   IAB  0   Ectopic  0   Multiple      Live Births             Past Medical History:  Diagnosis Date   Arthritis    right hand   Asthma    resolved as of 10/22/22 per pt   Breast mass 2016   benign   Chicken pox    as a child   COVID-19 11/2020   COVID-19 02/09/2022   Headache & sore throat   Depression    Follows w/ [name], NP @ [facility].   Family history of adverse reaction to anesthesia    Mother has N & V with anesthesia.   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   Wears glasses    Past Surgical History:  Procedure Laterality Date   BREAST EXCISIONAL BIOPSY Left 07/2015   benign   DILATION AND CURETTAGE OF UTERUS  2011   TONSILLECTOMY  2017   WISDOM TOOTH EXTRACTION  2017   Family History: family history includes Arthritis in her mother; Breast cancer in her maternal aunt; Colon cancer in her maternal grandmother; Diabetes in her paternal grandmother; Hypertension in her father and paternal grandfather. Social History:  reports that she has been smoking cigars. She has never used smokeless tobacco. She reports current alcohol use. She reports that she does not use drugs.   Review of Systems - Patient denies fever, chills, SOB, CP, N/V/D.  History   Height '5\' 4"'$   (1.626 m), weight 44.5 kg, last menstrual period 10/04/2022. Exam Physical Exam   Gen: alert, well appearing, no distress Chest: nonlabored breathing CV: no peripheral edema Abdomen: soft, nontender Ext: no evidence of DVT    Assessment/Plan: Continue with planned procedure: robotic assisted laparoscopic myomectomy. Discussed risks in detail, which include but are not limited to bleeding, infection, damage to nearby organs including bowel and bladder, need for open or additional procedure. She accepts blood products when indicated. Discussed goal for minimally invasive approach as she hopes to return to work and activity as soon as able.  Preoperative HGB 11.4  Carlyon Shadow 10/29/2022, 10:49 PM

## 2022-10-30 ENCOUNTER — Ambulatory Visit (HOSPITAL_BASED_OUTPATIENT_CLINIC_OR_DEPARTMENT_OTHER)
Admission: RE | Admit: 2022-10-30 | Discharge: 2022-10-30 | Disposition: A | Discharge status: Home / Self Care | Payer: PRIVATE HEALTH INSURANCE | Attending: Obstetrics and Gynecology | Admitting: Obstetrics and Gynecology

## 2022-10-30 ENCOUNTER — Other Ambulatory Visit: Payer: Self-pay

## 2022-10-30 ENCOUNTER — Encounter (HOSPITAL_BASED_OUTPATIENT_CLINIC_OR_DEPARTMENT_OTHER)
Admission: RE | Disposition: A | Discharge status: Home / Self Care | Payer: Self-pay | Source: Home / Self Care | Attending: Obstetrics and Gynecology

## 2022-10-30 ENCOUNTER — Encounter (HOSPITAL_BASED_OUTPATIENT_CLINIC_OR_DEPARTMENT_OTHER): Payer: Self-pay | Admitting: Obstetrics and Gynecology

## 2022-10-30 ENCOUNTER — Ambulatory Visit (HOSPITAL_BASED_OUTPATIENT_CLINIC_OR_DEPARTMENT_OTHER): Payer: PRIVATE HEALTH INSURANCE | Admitting: Anesthesiology

## 2022-10-30 DIAGNOSIS — R9431 Abnormal electrocardiogram [ECG] [EKG]: Secondary | ICD-10-CM | POA: Insufficient documentation

## 2022-10-30 DIAGNOSIS — D259 Leiomyoma of uterus, unspecified: Secondary | ICD-10-CM

## 2022-10-30 DIAGNOSIS — F17290 Nicotine dependence, other tobacco product, uncomplicated: Secondary | ICD-10-CM | POA: Insufficient documentation

## 2022-10-30 DIAGNOSIS — Z01818 Encounter for other preprocedural examination: Secondary | ICD-10-CM

## 2022-10-30 HISTORY — DX: Presence of spectacles and contact lenses: Z97.3

## 2022-10-30 HISTORY — DX: Family history of other specified conditions: Z84.89

## 2022-10-30 HISTORY — PX: ROBOT ASSISTED MYOMECTOMY: SHX5142

## 2022-10-30 HISTORY — DX: Unspecified osteoarthritis, unspecified site: M19.90

## 2022-10-30 LAB — ABO/RH: ABO/RH(D): A POS

## 2022-10-30 LAB — TYPE AND SCREEN
ABO/RH(D): A POS
Antibody Screen: NEGATIVE

## 2022-10-30 LAB — POCT PREGNANCY, URINE: Preg Test, Ur: NEGATIVE

## 2022-10-30 SURGERY — MYOMECTOMY, ROBOT-ASSISTED
Anesthesia: General | Site: Abdomen

## 2022-10-30 MED ORDER — LIDOCAINE HCL (PF) 2 % IJ SOLN
INTRAMUSCULAR | Status: AC
  Filled 2022-10-30: qty 5

## 2022-10-30 MED ORDER — ROCURONIUM BROMIDE 10 MG/ML (PF) SYRINGE
PREFILLED_SYRINGE | INTRAVENOUS | Status: DC | PRN
  Administered 2022-10-30: 80 mg via INTRAVENOUS
  Administered 2022-10-30: 10 mg via INTRAVENOUS

## 2022-10-30 MED ORDER — LACTATED RINGERS IV SOLN: INTRAVENOUS | Status: DC

## 2022-10-30 MED ORDER — DEXAMETHASONE SODIUM PHOSPHATE 10 MG/ML IJ SOLN
INTRAMUSCULAR | Status: AC
  Filled 2022-10-30: qty 1

## 2022-10-30 MED ORDER — MIDAZOLAM HCL 5 MG/5ML IJ SOLN
INTRAMUSCULAR | Status: DC | PRN
  Administered 2022-10-30: 2 mg via INTRAVENOUS

## 2022-10-30 MED ORDER — PHENYLEPHRINE 80 MCG/ML (10ML) SYRINGE FOR IV PUSH (FOR BLOOD PRESSURE SUPPORT)
PREFILLED_SYRINGE | INTRAVENOUS | Status: DC | PRN
  Administered 2022-10-30 (×2): 80 ug via INTRAVENOUS
  Administered 2022-10-30: 40 ug via INTRAVENOUS

## 2022-10-30 MED ORDER — STERILE WATER FOR IRRIGATION IR SOLN
Status: DC | PRN
  Administered 2022-10-30: 500 mL

## 2022-10-30 MED ORDER — DEXAMETHASONE SODIUM PHOSPHATE 10 MG/ML IJ SOLN
INTRAMUSCULAR | Status: DC | PRN
  Administered 2022-10-30: 5 mg via INTRAVENOUS

## 2022-10-30 MED ORDER — ONDANSETRON HCL 4 MG/2ML IJ SOLN
INTRAMUSCULAR | Status: DC | PRN
  Administered 2022-10-30: 4 mg via INTRAVENOUS

## 2022-10-30 MED ORDER — FENTANYL CITRATE (PF) 250 MCG/5ML IJ SOLN
INTRAMUSCULAR | Status: AC
  Filled 2022-10-30: qty 5

## 2022-10-30 MED ORDER — OXYCODONE HCL 5 MG/5ML PO SOLN: 5.0000 mg | Freq: Once | ORAL | Status: DC | PRN

## 2022-10-30 MED ORDER — ONDANSETRON HCL 4 MG/2ML IJ SOLN: 4.0000 mg | Freq: Once | INTRAMUSCULAR | Status: DC | PRN

## 2022-10-30 MED ORDER — HYDROMORPHONE HCL 1 MG/ML IJ SOLN: 0.2500 mg | INTRAMUSCULAR | Status: DC | PRN

## 2022-10-30 MED ORDER — DEXMEDETOMIDINE HCL IN NACL 80 MCG/20ML IV SOLN
INTRAVENOUS | Status: AC
  Filled 2022-10-30: qty 20

## 2022-10-30 MED ORDER — AMISULPRIDE (ANTIEMETIC) 5 MG/2ML IV SOLN
INTRAVENOUS | Status: AC
  Filled 2022-10-30: qty 4

## 2022-10-30 MED ORDER — KETOROLAC TROMETHAMINE 30 MG/ML IJ SOLN
INTRAMUSCULAR | Status: DC | PRN
  Administered 2022-10-30: 30 mg via INTRAVENOUS

## 2022-10-30 MED ORDER — LIDOCAINE 2% (20 MG/ML) 5 ML SYRINGE
INTRAMUSCULAR | Status: DC | PRN
  Administered 2022-10-30: 80 mg via INTRAVENOUS

## 2022-10-30 MED ORDER — OXYCODONE HCL 5 MG PO TABS: 5.0000 mg | ORAL_TABLET | ORAL | 0 refills | Status: DC | PRN

## 2022-10-30 MED ORDER — SCOPOLAMINE 1 MG/3DAYS TD PT72
1.0000 | MEDICATED_PATCH | Freq: Once | TRANSDERMAL | Status: DC
  Administered 2022-10-30: 1.5 mg via TRANSDERMAL

## 2022-10-30 MED ORDER — PROPOFOL 10 MG/ML IV BOLUS
INTRAVENOUS | Status: AC
  Filled 2022-10-30: qty 20

## 2022-10-30 MED ORDER — FENTANYL CITRATE (PF) 100 MCG/2ML IJ SOLN
INTRAMUSCULAR | Status: DC | PRN
  Administered 2022-10-30: 150 ug via INTRAVENOUS
  Administered 2022-10-30: 25 ug via INTRAVENOUS

## 2022-10-30 MED ORDER — KETOROLAC TROMETHAMINE 30 MG/ML IJ SOLN
INTRAMUSCULAR | Status: AC
  Filled 2022-10-30: qty 1

## 2022-10-30 MED ORDER — MIDAZOLAM HCL 2 MG/2ML IJ SOLN
INTRAMUSCULAR | Status: AC
  Filled 2022-10-30: qty 2

## 2022-10-30 MED ORDER — POVIDONE-IODINE 10 % EX SWAB: 2.0000 | Freq: Once | CUTANEOUS | Status: DC

## 2022-10-30 MED ORDER — SCOPOLAMINE 1 MG/3DAYS TD PT72
MEDICATED_PATCH | TRANSDERMAL | Status: AC
  Filled 2022-10-30: qty 1

## 2022-10-30 MED ORDER — CEFAZOLIN SODIUM-DEXTROSE 2-4 GM/100ML-% IV SOLN
INTRAVENOUS | Status: AC
  Filled 2022-10-30: qty 100

## 2022-10-30 MED ORDER — ACETAMINOPHEN 325 MG PO TABS: 650.0000 mg | ORAL_TABLET | Freq: Four times a day (QID) | ORAL | 0 refills | Status: DC | PRN

## 2022-10-30 MED ORDER — ONDANSETRON HCL 4 MG/2ML IJ SOLN
INTRAMUSCULAR | Status: AC
  Filled 2022-10-30: qty 2

## 2022-10-30 MED ORDER — OXYCODONE HCL 5 MG PO TABS: 5.0000 mg | ORAL_TABLET | Freq: Once | ORAL | Status: DC | PRN

## 2022-10-30 MED ORDER — AMISULPRIDE (ANTIEMETIC) 5 MG/2ML IV SOLN
10.0000 mg | Freq: Once | INTRAVENOUS | Status: AC | PRN
  Administered 2022-10-30: 10 mg via INTRAVENOUS

## 2022-10-30 MED ORDER — PROPOFOL 10 MG/ML IV BOLUS
INTRAVENOUS | Status: DC | PRN
  Administered 2022-10-30: 180 mg via INTRAVENOUS

## 2022-10-30 MED ORDER — SODIUM CHLORIDE 0.9 % IR SOLN
Status: DC | PRN
  Administered 2022-10-30: 1000 mL

## 2022-10-30 MED ORDER — CEFAZOLIN SODIUM-DEXTROSE 2-4 GM/100ML-% IV SOLN
2.0000 g | INTRAVENOUS | Status: AC
  Administered 2022-10-30: 2 g via INTRAVENOUS

## 2022-10-30 MED ORDER — BUPIVACAINE HCL (PF) 0.25 % IJ SOLN
INTRAMUSCULAR | Status: DC | PRN
  Administered 2022-10-30: 10 mL

## 2022-10-30 MED ORDER — ROCURONIUM BROMIDE 10 MG/ML (PF) SYRINGE
PREFILLED_SYRINGE | INTRAVENOUS | Status: AC
  Filled 2022-10-30: qty 10

## 2022-10-30 MED ORDER — DEXMEDETOMIDINE HCL IN NACL 80 MCG/20ML IV SOLN
INTRAVENOUS | Status: DC | PRN
  Administered 2022-10-30: 12 ug via BUCCAL

## 2022-10-30 MED ORDER — IBUPROFEN 200 MG PO TABS: 400.0000 mg | ORAL_TABLET | Freq: Four times a day (QID) | ORAL | 0 refills | Status: DC | PRN

## 2022-10-30 MED ORDER — HEMOSTATIC AGENTS (NO CHARGE) OPTIME
TOPICAL | Status: DC | PRN
  Administered 2022-10-30: 1 via TOPICAL

## 2022-10-30 MED ORDER — PHENYLEPHRINE 80 MCG/ML (10ML) SYRINGE FOR IV PUSH (FOR BLOOD PRESSURE SUPPORT)
PREFILLED_SYRINGE | INTRAVENOUS | Status: AC
  Filled 2022-10-30: qty 10

## 2022-10-30 SURGICAL SUPPLY — 66 items
ADH SKN CLS APL DERMABOND .7 (GAUZE/BANDAGES/DRESSINGS) ×1
BAG DECANTER FOR FLEXI CONT (MISCELLANEOUS) IMPLANT
BARRIER ADHS 3X4 INTERCEED (GAUZE/BANDAGES/DRESSINGS) IMPLANT
BLADE SURG 10 STRL SS (BLADE) IMPLANT
BRR ADH 4X3 ABS CNTRL BYND (GAUZE/BANDAGES/DRESSINGS) ×1
CATH FOLEY 3WAY  5CC 16FR (CATHETERS) ×1
CATH FOLEY 3WAY 5CC 16FR (CATHETERS) ×1 IMPLANT
CNTNR URN SCR LID CUP LEK RST (MISCELLANEOUS) IMPLANT
CONT SPEC 4OZ STRL OR WHT (MISCELLANEOUS) ×1
COVER BACK TABLE 60X90IN (DRAPES) ×1 IMPLANT
COVER TIP SHEARS 8 DVNC (MISCELLANEOUS) ×1 IMPLANT
COVER TIP SHEARS 8MM DA VINCI (MISCELLANEOUS) ×1
DEFOGGER SCOPE WARMER CLEARIFY (MISCELLANEOUS) ×1 IMPLANT
DERMABOND ADVANCED .7 DNX12 (GAUZE/BANDAGES/DRESSINGS) ×1 IMPLANT
DRAPE ARM DVNC X/XI (DISPOSABLE) ×4 IMPLANT
DRAPE COLUMN DVNC XI (DISPOSABLE) ×1 IMPLANT
DRAPE DA VINCI XI ARM (DISPOSABLE) ×4
DRAPE DA VINCI XI COLUMN (DISPOSABLE) ×1
DRAPE UTILITY XL STRL (DRAPES) ×1 IMPLANT
DURAPREP 26ML APPLICATOR (WOUND CARE) ×1 IMPLANT
ELECT REM PT RETURN 9FT ADLT (ELECTROSURGICAL) ×1
ELECTRODE REM PT RTRN 9FT ADLT (ELECTROSURGICAL) ×1 IMPLANT
GAUZE 4X4 16PLY ~~LOC~~+RFID DBL (SPONGE) ×1 IMPLANT
GAUZE PETROLATUM 1 X8 (GAUZE/BANDAGES/DRESSINGS) IMPLANT
GLOVE BIO SURGEON STRL SZ 6.5 (GLOVE) IMPLANT
GLOVE BIO SURGEON STRL SZ7 (GLOVE) ×3 IMPLANT
GLOVE BIOGEL PI IND STRL 6.5 (GLOVE) IMPLANT
GLOVE BIOGEL PI IND STRL 7.0 (GLOVE) IMPLANT
GLOVE BIOGEL PI IND STRL 7.5 (GLOVE) ×3 IMPLANT
GLOVE ECLIPSE 6.5 STRL STRAW (GLOVE) IMPLANT
GOWN STRL REUS W/ TWL LRG LVL3 (GOWN DISPOSABLE) IMPLANT
GOWN STRL REUS W/TWL LRG LVL3 (GOWN DISPOSABLE) ×1
HOLDER FOLEY CATH W/STRAP (MISCELLANEOUS) IMPLANT
IRRIG SUCT STRYKERFLOW 2 WTIP (MISCELLANEOUS) ×1
IRRIGATION SUCT STRKRFLW 2 WTP (MISCELLANEOUS) ×1 IMPLANT
IV NS 1000ML (IV SOLUTION) ×1
IV NS 1000ML BAXH (IV SOLUTION) IMPLANT
KIT PINK PAD W/HEAD ARE REST (MISCELLANEOUS) ×1
KIT PINK PAD W/HEAD ARM REST (MISCELLANEOUS) IMPLANT
KIT TURNOVER CYSTO (KITS) ×1 IMPLANT
NDL SAFETY ECLIP 18X1.5 (MISCELLANEOUS) IMPLANT
NDL SPNL 22GX7 QUINCKE BK (NEEDLE) IMPLANT
NEEDLE SPNL 22GX7 QUINCKE BK (NEEDLE) ×1 IMPLANT
OBTURATOR OPTICAL STANDARD 8MM (TROCAR) ×1
OBTURATOR OPTICAL STND 8 DVNC (TROCAR) ×1
OBTURATOR OPTICALSTD 8 DVNC (TROCAR) ×1 IMPLANT
OCCLUDER COLPOPNEUMO (BALLOONS) ×1 IMPLANT
PACK ROBOT WH (CUSTOM PROCEDURE TRAY) ×1 IMPLANT
PACK ROBOTIC GOWN (GOWN DISPOSABLE) ×1 IMPLANT
PAD OB MATERNITY 4.3X12.25 (PERSONAL CARE ITEMS) ×1 IMPLANT
PAD PREP 24X48 CUFFED NSTRL (MISCELLANEOUS) ×1 IMPLANT
PROTECTOR NERVE ULNAR (MISCELLANEOUS) ×2 IMPLANT
SEAL CANN UNIV 5-8 DVNC XI (MISCELLANEOUS) ×3 IMPLANT
SEAL XI 5MM-8MM UNIVERSAL (MISCELLANEOUS) ×3
SET TRI-LUMEN FLTR TB AIRSEAL (TUBING) ×1 IMPLANT
SUT VIC AB 0 CT1 27 (SUTURE) ×1
SUT VIC AB 0 CT1 27XBRD ANBCTR (SUTURE) IMPLANT
SUT VIC AB 4-0 PS2 18 (SUTURE) IMPLANT
SUT VICRYL 0 UR6 27IN ABS (SUTURE) IMPLANT
SUT VICRYL 4-0 PS2 18IN ABS (SUTURE) ×2 IMPLANT
SYR 30ML LL (SYRINGE) IMPLANT
SYS RETRIEVAL 5MM INZII UNIV (BASKET) ×1
SYSTEM RETRIEVL 5MM INZII UNIV (BASKET) IMPLANT
TOWEL OR 17X26 10 PK STRL BLUE (TOWEL DISPOSABLE) ×1 IMPLANT
TROCAR PORT AIRSEAL 8X120 (TROCAR) ×1 IMPLANT
WATER STERILE IRR 500ML POUR (IV SOLUTION) IMPLANT

## 2022-10-30 NOTE — Discharge Instructions (Signed)
  Post Anesthesia Home Care Instructions  Activity: Get plenty of rest for the remainder of the day. A responsible individual must stay with you for 24 hours following the procedure.  For the next 24 hours, DO NOT: -Drive a car -Paediatric nurse -Drink alcoholic beverages -Take any medication unless instructed by your physician -Make any legal decisions or sign important papers.  Meals: Start with liquid foods such as gelatin or soup. Progress to regular foods as tolerated. Avoid greasy, spicy, heavy foods. If nausea and/or vomiting occur, drink only clear liquids until the nausea and/or vomiting subsides. Call your physician if vomiting continues.  Special Instructions/Symptoms: Your throat may feel dry or sore from the anesthesia or the breathing tube placed in your throat during surgery. If this causes discomfort, gargle with warm salt water. The discomfort should disappear within 24 hours.  If you had a scopolamine patch placed behind your ear for the management of post- operative nausea and/or vomiting:  1. The medication in the patch is effective for 72 hours, after which it should be removed.  Wrap patch in a tissue and discard in the trash. Wash hands thoroughly with soap and water. 2. You may remove the patch earlier than 72 hours if you experience unpleasant side effects which may include dry mouth, dizziness or visual disturbances. 3. Avoid touching the patch. Wash your hands with soap and water after contact with the patch.    Remove patch behind left ear by Monday, November 02, 2022.

## 2022-10-30 NOTE — Brief Op Note (Signed)
10/30/2022  10:25 AM  PATIENT:  Kristen Ball  31 y.o. female  PRE-OPERATIVE DIAGNOSIS:  FIBROID  POST-OPERATIVE DIAGNOSIS:  FIBROID  PROCEDURE:  Procedure(s): XI ROBOTIC ASSISTED LAPAROSCOPIC MYOMECTOMY (N/A)  SURGEON:  Surgeon(s) and Role:    * Carlyon Shadow, MD - Primary  RNFA  ANESTHESIA:   none  EBL:  100 cc   BLOOD ADMINISTERED:none  DRAINS: none   LOCAL MEDICATIONS USED:  LIDOCAINE   SPECIMEN:  Uterine fibroid  DISPOSITION OF SPECIMEN:  PATHOLOGY  COUNTS:  YES  TOURNIQUET:  * No tourniquets in log *  DICTATION: .Note written in EPIC  PLAN OF CARE: Discharge to home after PACU  PATIENT DISPOSITION:  PACU - hemodynamically stable.   Delay start of Pharmacological VTE agent (>24hrs) due to surgical blood loss or risk of bleeding: No

## 2022-10-30 NOTE — Transfer of Care (Signed)
Immediate Anesthesia Transfer of Care Note  Patient: Kristen Ball  Procedure(s) Performed: XI ROBOTIC ASSISTED LAPAROSCOPIC MYOMECTOMY (Abdomen)  Patient Location: PACU  Anesthesia Type:General  Level of Consciousness: drowsy, patient cooperative, and responds to stimulation  Airway & Oxygen Therapy: Patient Spontanous Breathing and Patient connected to face mask oxygen  Post-op Assessment: Report given to RN and Post -op Vital signs reviewed and stable  Post vital signs: Reviewed and stable  Last Vitals:  Vitals Value Taken Time  BP 123/92 10/30/22 1037  Temp 36.2 C 10/30/22 1037  Pulse 77 10/30/22 1041  Resp 10 10/30/22 1041  SpO2 100 % 10/30/22 1041  Vitals shown include unvalidated device data.  Last Pain:  Vitals:   10/30/22 0612  TempSrc: Oral  PainSc: 0-No pain      Patients Stated Pain Goal: 6 (53/00/51 1021)  Complications: No notable events documented.

## 2022-10-30 NOTE — Progress Notes (Signed)
No changes to H&P.  Procedure reviewed again and all questions answered.  Kristen Ball

## 2022-10-30 NOTE — Op Note (Signed)
Operative Note  PREOPERATIVE DIAGNOSES: 1. Uterine fibroid  POSTOPERATIVE DIAGNOSES: 1. same  PROCEDURE PERFORMED: Robotic-assisted laparoscopic myomectomy.  SURGEON: Dr. Alpha Gula  ASSISTANT: Claretta Fraise RNFA  ANESTHESIA: General   ESTIMATED BLOOD LOSS: 100 cc.  URINE OUTPUT: clear urine at the end of the procedure.  COMPLICATIONS: None   TUBES: None.  DRAINS: Foley to gravity removed at conclusion of case  PATHOLOGY: Uterine fibroid sent to pathology for review.  FINDINGS: On exam, under anesthesia, normal appearing vulva and vagina, a normal sized uterus with pedunculated fibroid at the left uterine cornua  Normal fallopian tubes and ovaries bilaterally. The appendix was normal appearing . The bowel and omentum were normal appearing.  Procedure: A general anesthesia was induced and the patient was placed in the dirsal lithotomy position. The abdomen, perineum, and vagina were prepped and draped in the usual fashion. A foley catheter inserted into the bladder and attached to straight drainage.   Following infiltration with local anesthetic, an infraumbilical incision was made and entry was attempted via direct visualization.  Patient is of small stature and BMI of 17, therefore care was taken with entry.  Fascia and peritoneum were very robust and in order to enter safely, a small incision was made into fascia and peritoneum with open entry. This was done by visualizing fascia and elevating from intraabdominal structures with Kochers.  Once entered, CO2 was used for insufflation.  Two additional 8 mm ports were placed in the R and L lower quadrants.  An additional assistant port was placed on the left side.  The above findings were noted and the robot was docked.  The fibroid was grasped with single tooth tenaculum and monopolar scissors were used to transect the fibroid at its base. The fibroid was located in the cornua and the insertion of fallopian tube was  involved.  There is a chance that there will no longer be tubal patency due to this involvement.  Vicryl was used to re approximate the serosa and underlying myometrium in a running fashion.  Uterine cavity was not entered.  Irrigation was done and hemostasis noted throughout the abdomen.  The myomectomy site was then covered in interceed adhesion barrier material.   A laparoscopic bag was introduced and the fibroid placed inside.  The robot was undocked and laparoscopic instruments used to bring the bag to the infraumbilical incision.  The other port sites were then removed with direct visualization. The fibroid was meticulously morcellated with care not to injure underlying structures.  The fibroid was quite firm and this was done through a 10 mm fascial incision.  Eventually the specimen was removed with bag intact.  At this time, the fascia was closed with running 0-vicryl on UR needle.  The skin of each port site was closed with vicryl.  The sponge and lap counts were correct times 2 at the conclusion of the case.   The patient's procedure was terminated. We then awakened her. She was sent to the Recovery Room in good condition.   Alpha Gula MD

## 2022-10-30 NOTE — Anesthesia Postprocedure Evaluation (Signed)
Anesthesia Post Note  Patient: Kristen Ball  Procedure(s) Performed: XI ROBOTIC ASSISTED LAPAROSCOPIC MYOMECTOMY (Abdomen)     Patient location during evaluation: PACU Anesthesia Type: General Level of consciousness: awake and alert Pain management: pain level controlled Vital Signs Assessment: post-procedure vital signs reviewed and stable Respiratory status: spontaneous breathing, nonlabored ventilation and respiratory function stable Cardiovascular status: blood pressure returned to baseline and stable Postop Assessment: no apparent nausea or vomiting Anesthetic complications: no   No notable events documented.  Last Vitals:  Vitals:   10/30/22 1100 10/30/22 1115  BP: 119/88 128/84  Pulse: (!) 59 (!) 57  Resp: 17 17  Temp:  36.4 C  SpO2: 100% 100%    Last Pain:  Vitals:   10/30/22 1100  TempSrc:   PainSc: 0-No pain                 Khamila Bassinger A.

## 2022-10-30 NOTE — Anesthesia Procedure Notes (Signed)
Procedure Name: Intubation Date/Time: 10/30/2022 7:40 AM  Performed by: Rogers Blocker, CRNAPre-anesthesia Checklist: Patient identified, Emergency Drugs available, Suction available and Patient being monitored Patient Re-evaluated:Patient Re-evaluated prior to induction Oxygen Delivery Method: Circle System Utilized Preoxygenation: Pre-oxygenation with 100% oxygen Induction Type: IV induction Ventilation: Mask ventilation without difficulty Laryngoscope Size: Mac and 3 Grade View: Grade I Tube type: Oral Tube size: 7.0 mm Number of attempts: 1 Airway Equipment and Method: Stylet and Bite block Placement Confirmation: ETT inserted through vocal cords under direct vision, positive ETCO2 and breath sounds checked- equal and bilateral Secured at: 22 cm Tube secured with: Tape Dental Injury: Teeth and Oropharynx as per pre-operative assessment

## 2022-11-03 LAB — SURGICAL PATHOLOGY

## 2022-11-04 ENCOUNTER — Encounter (HOSPITAL_BASED_OUTPATIENT_CLINIC_OR_DEPARTMENT_OTHER): Payer: Self-pay | Admitting: Obstetrics and Gynecology

## 2022-11-17 ENCOUNTER — Ambulatory Visit: Payer: Self-pay | Admitting: *Deleted

## 2022-11-17 MED ORDER — ONDANSETRON 4 MG PO TBDP: 4.0000 mg | ORAL_TABLET | Freq: Three times a day (TID) | ORAL | 0 refills | Status: DC | PRN

## 2022-11-17 NOTE — Telephone Encounter (Signed)
Zofran sent to pharmacy

## 2022-11-17 NOTE — Addendum Note (Signed)
Addended by: Jearld Fenton on: 11/17/2022 01:00 PM   Modules accepted: Orders

## 2022-11-17 NOTE — Telephone Encounter (Signed)
Pt is experiencing extreme nausea while taking the fluoxetine, she is asking for Zofran or anything to help treat.  [id number] - [place], [address] AT [address] [place] [address] Phone: [phone] Fax: [phone]    Chief Complaint: Medication Question Symptoms: Nausea, diarrhea, "Shaky" after taking first dose of Fluoxetine last night. Diarrhea started this AM Frequency: Last night Pertinent Negatives: Patient denies  Disposition: '[]'$ ED /'[]'$ Urgent Care (no appt availability in office) / '[]'$ Appointment(In office/virtual)/ '[]'$  [facility]/ '[]'$ Home Care/ '[]'$ Refused Recommended Disposition /'[]'$ [facility]/ '[x]'$  Follow-up with PCP Additional Notes: Pt states she believes side effecs fro med. States she would like to stay on medication. Requesting Zofran "To get past this, Don't want to stop med." Reason for Disposition  [1] Caller has URGENT medicine question about med that PCP or specialist prescribed AND [2] triager unable to answer question  Answer Assessment - Initial Assessment Questions 1. NAME of MEDICINE: "What medicine(s) are you calling about?"     Prozac 2. QUESTION: "What is your question?" (e.g., double dose of medicine, side effect)     Nausea, diarrhea, "Little shaky." 3. PRESCRIBER: "Who prescribed the medicine?" Reason: if prescribed by specialist, call should be referred to that group.     PCP 4. SYMPTOMS: "Do you have any symptoms?" If Yes, ask: "What symptoms are you having?"  "How bad are the symptoms (e.g., mild, moderate, severe)     Nausea, diarrhea,"Little shaky."  Protocols used: Medication Question Call-A-AH

## 2023-04-20 ENCOUNTER — Encounter: Payer: Self-pay | Admitting: Internal Medicine

## 2024-06-29 ENCOUNTER — Encounter: Payer: Self-pay | Admitting: Internal Medicine

## 2024-06-30 NOTE — Telephone Encounter (Signed)
 She would at least need a virtual visit.  We can schedule this for follow-up of chronic conditions and form completion.

## 2024-07-03 ENCOUNTER — Telehealth: Payer: Self-pay | Admitting: Internal Medicine

## 2024-07-03 ENCOUNTER — Encounter: Payer: Self-pay | Admitting: Internal Medicine

## 2024-07-03 DIAGNOSIS — K253 Acute gastric ulcer without hemorrhage or perforation: Secondary | ICD-10-CM

## 2024-07-03 DIAGNOSIS — M79641 Pain in right hand: Secondary | ICD-10-CM

## 2024-07-03 DIAGNOSIS — K259 Gastric ulcer, unspecified as acute or chronic, without hemorrhage or perforation: Secondary | ICD-10-CM | POA: Insufficient documentation

## 2024-07-03 DIAGNOSIS — G43C1 Periodic headache syndromes in child or adult, intractable: Secondary | ICD-10-CM

## 2024-07-03 DIAGNOSIS — D500 Iron deficiency anemia secondary to blood loss (chronic): Secondary | ICD-10-CM

## 2024-07-03 DIAGNOSIS — F329 Major depressive disorder, single episode, unspecified: Secondary | ICD-10-CM

## 2024-07-03 DIAGNOSIS — M79642 Pain in left hand: Secondary | ICD-10-CM

## 2024-07-03 DIAGNOSIS — D649 Anemia, unspecified: Secondary | ICD-10-CM | POA: Insufficient documentation

## 2024-07-03 DIAGNOSIS — J4520 Mild intermittent asthma, uncomplicated: Secondary | ICD-10-CM

## 2024-07-03 DIAGNOSIS — A6004 Herpesviral vulvovaginitis: Secondary | ICD-10-CM

## 2024-07-03 NOTE — Assessment & Plan Note (Signed)
 Continue pantoprazole as previously prescribed She will need referral to GI once she is back in [place]

## 2024-07-03 NOTE — Assessment & Plan Note (Signed)
Currently not an issue Will monitor 

## 2024-07-03 NOTE — Assessment & Plan Note (Signed)
 CBC reviewed She will need referral to hematology upon her return to [facility]

## 2024-07-03 NOTE — Assessment & Plan Note (Signed)
 Encouraged stress reduction techniques Not medicated at this time Will monitor

## 2024-07-03 NOTE — Assessment & Plan Note (Signed)
Not medicated Support offered 

## 2024-07-03 NOTE — Assessment & Plan Note (Signed)
 Not medicated Will monitor

## 2024-07-03 NOTE — Assessment & Plan Note (Signed)
 Continue valcyclovir 1000 mg daily prn for outbreaks

## 2024-07-03 NOTE — Patient Instructions (Signed)
Iron Deficiency Anemia, Adult  Iron deficiency anemia is when you do not have enough red blood cells or hemoglobin in your blood. This happens because you have too little iron in your body. Hemoglobin carries oxygen to parts of the body. Anemia can cause your body to not get enough oxygen. What are the causes? Not eating enough foods that have iron in them. The body not being able to take in iron well. Blood loss. What increases the risk? Having menstrual periods. Being pregnant. What are the signs or symptoms? Pale skin, lips, and nails. Weakness, dizziness, and getting tired easily. Feeling like you cannot breathe well when moving (shortness of breath). Cold hands and feet. Mild anemia may not cause any symptoms. How is this treated? This condition is treated by finding out why you do not have enough iron and then getting more iron. It may include: Adding foods to your diet that have a lot of iron. Taking iron pills (supplements). If you are pregnant or breastfeeding, you may need to take extra iron. Your diet often does not provide the amount of iron that you need. Getting more vitamin C in your diet. Vitamin C helps your body take in iron. You may need to take iron pills with a glass of orange juice or vitamin C pills. Medicines to make heavy menstrual periods lighter. Surgery or testing procedures to find what is causing the condition. You may need blood tests to see if treatment is working. If the treatment does not seem to be working, you may need more tests. Follow these instructions at home: Medicines Take over-the-counter and prescription medicines only as told by your doctor. This includes iron pills and vitamins. Taking them as told is important because too much iron can be harmful. Take iron pills when your stomach is empty. If you cannot handle this, take them with food. Do not drink milk or take antacids at the same time as your iron pills. Iron pills may turn your poop  (stool)black. If you cannot handle taking iron pills by mouth, ask your doctor about getting iron through: An IV tube. A shot (injection) into a muscle. Eating and drinking Talk with your doctor before changing the foods you eat. Your doctor may tell you to eat foods that have a lot of iron, such as: Liver. Low-fat (lean) beef. Breads and cereals that have iron added to them. Eggs. Dried fruit. Dark green, leafy vegetables. Eat fresh fruits and vegetables that are high in vitamin C. They help your body use iron. Foods with a lot of vitamin C include: Oranges. Peppers. Tomatoes. Mangoes. Managing constipation If you are taking iron pills, they may cause trouble pooping (constipation). To prevent or treat this, you may need to: Drink enough fluid to keep your pee (urine) pale yellow. Take over-the-counter or prescription medicines. Eat foods that are high in fiber. These include beans, whole grains, and fresh fruits and vegetables. Limit foods that are high in fat and sugar. These include fried or sweet foods. General instructions Return to your normal activities when your doctor says that it is safe. Keep all follow-up visits. Contact a doctor if: You feel like you may vomit (nauseous), or you vomit. You feel weak. You get light-headed when getting up from sitting or lying down. You are sweating for no reason. You have trouble pooping. You have worse breathing with physical activity. You have heaviness in your chest. Get help right away if: You faint. If this happens, do not drive yourself  to the hospital. You have a fast heartbeat, or a heartbeat that does not feel regular. Summary Iron deficiency anemia happens when you have too little iron in your body. This condition is treated by finding out why you do not have enough iron in your body and then getting more iron. Take over-the-counter and prescription medicines only as told by your doctor. Eat fresh fruits and vegetables  that are high in vitamin C. Contact a doctor if you have trouble pooping or feel weak. This information is not intended to replace advice given to you by your health care provider. Make sure you discuss any questions you have with your health care provider. Document Revised: 12/04/2021 Document Reviewed: 12/04/2021 Elsevier Patient Education  2024 [employer].

## 2024-07-03 NOTE — Progress Notes (Signed)
 Virtual Visit via Video Note  I connected with Kristen Ball on 07/03/24 at  1:20 PM EDT by a video enabled telemedicine application and verified that I am speaking with the correct person using two identifiers.  Location: Patient: Work Provider: Engineer, structural in this video call: Angeline Laura, NP-C and Kenzee Bassin   I discussed the limitations of evaluation and management by telemedicine and the availability of in person appointments. The patient expressed understanding and agreed to proceed.  History of Present Illness:  Discussed the use of AI scribe software for clinical note transcription with the patient, who gave verbal consent to proceed.  Pt due for followup of chronic conditions.  Migraines: These occur rarely.  Triggered by stress.  She no longer takes topamax .  She does not follow with neurology.  Asthma: Mild, intermittent.  She is not using any inhalers at this time.  There are no PFTs on file.  Depression: Chronic, but she is not currently taking any medications for this.  She was prescribed mirtazapine , sertraline , fluvoxamine  and venlafaxine  in the past.  She is not currently seeing a therapist.  She denies anxiety, SI/HI.  Genital herpes: She denies recent outbreak. She takes valacylcovir only as needed.  Chronic hand pain: Bilateral.  She is no longer taking celebrex  and gabapentin .  She does not follow with orthopedics.   Anemia: Secondary to stomach ulcer, recent hospitalization 05/2024 for the same. Her last H/H was 9.5/29.6, 05/2024. She has had iron infusions but is not taking any oral iron. She is taking pantoprazole as prescribed. She does not follow with hematology or GI.  She also needs a TB form completed for work.  Past Medical History:  Diagnosis Date   Arthritis    right hand   Asthma    resolved as of 10/22/22 per pt   Breast mass 2016   benign   Chicken pox    as a child   COVID-19 11/2020   COVID-19 02/09/2022   Headache  & sore throat   Depression    Follows w/ Angeline Laura, NP @ [facility]   Family history of adverse reaction to anesthesia    Mother has N & V with anesthesia.   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   Wears glasses     Current Outpatient Medications  Medication Sig Dispense Refill   acetaminophen  (TYLENOL ) 325 MG tablet Take 2 tablets (650 mg total) by mouth every 6 (six) hours as needed. 30 tablet 0   ibuprofen  (MOTRIN  IB) 200 MG tablet Take 2 tablets (400 mg total) by mouth every 6 (six) hours as needed. 30 tablet 0   methocarbamol  (ROBAXIN ) 500 MG tablet Take 1 tablet (500 mg total) by mouth every 8 (eight) hours as needed for muscle spasms. 30 tablet 2   ondansetron  (ZOFRAN -ODT) 4 MG disintegrating tablet Take 1 tablet (4 mg total) by mouth every 8 (eight) hours as needed for nausea or vomiting. 30 tablet 0   oxyCODONE  (ROXICODONE ) 5 MG immediate release tablet Take 1 tablet (5 mg total) by mouth every 4 (four) hours as needed for severe pain. 12 tablet 0   valACYclovir (VALTREX) 1000 MG tablet valacyclovir 1 gram tablet  TAKE 1 TABLET BY MOUTH EVERY DAY     No current facility-administered medications for this  visit.    Allergies  Allergen Reactions   Metronidazole Hives, Itching and Rash    Family History  Problem Relation Age of Onset   Arthritis Mother    Hypertension Father    Colon cancer Maternal Grandmother    Diabetes Paternal Grandmother    Hypertension Paternal Grandfather    Breast cancer Maternal Aunt    Migraines Neg Hx     Social History   Socioeconomic History   Marital status: Single    Spouse name: Not on file   Number of children: 0   Years of education: Some coll   Highest education level: Associate degree: occupational, Scientist, product/process development, or vocational program  Occupational History   Occupation: Post  office  Tobacco Use   Smoking status: Every Day    Types: Cigars   Smokeless tobacco: Never   Tobacco comments:    1 cigar daily  Vaping Use   Vaping status: Never Used  Substance and Sexual Activity   Alcohol use: Yes    Comment: very rarely   Drug use: No   Sexual activity: Yes    Birth control/protection: None  Other Topics Concern   Not on file  Social History Narrative   ** Merged History Encounter **       Work or School: works at post office      Home Situation: lives alone       Spiritual Beliefs: none      Lifestyle: no regular exercising; diet is poor      Right-handed   Rare caffeine use, occasional soda         Social Drivers of Corporate investment banker Strain: High Risk (06/30/2024)   Overall Financial Resource Strain (CARDIA)    Difficulty of Paying Living Expenses: Hard  Food Insecurity: Food Insecurity Present (06/30/2024)   Hunger Vital Sign    Worried About Running Out of Food in the Last Year: Sometimes true    Ran Out of Food in the Last Year: Never true  Transportation Needs: No Transportation Needs (06/30/2024)   PRAPARE - Administrator, Civil Service (Medical): No    Lack of Transportation (Non-Medical): No  Physical Activity: Sufficiently Active (06/30/2024)   Exercise Vital Sign    Days of Exercise per Week: 5 days    Minutes of Exercise per Session: 60 min  Stress: Stress Concern Present (06/30/2024)   [employer] of Occupational Health - Occupational Stress Questionnaire    Feeling of Stress: Very much  Social Connections: Moderately Isolated (06/30/2024)   Social Connection and Isolation Panel    Frequency of Communication with Friends and Family: More than three times a week    Frequency of Social Gatherings with Friends and Family: Once a week    Attends Religious Services: More than 4 times per year    Active Member of [employer] or Organizations: No    Attends Engineer, structural: Not on file    Marital  Status: Never married  Intimate Partner Violence: Not At Risk (05/18/2024)   Received from [employer]   Humiliation, Afraid, Rape, and Kick questionnaire    Within the last year, have you been afraid of your partner or ex-partner?: No    Within the last year, have you been humiliated or emotionally abused in other ways by your partner or ex-partner?: No    Within the last year, have you been kicked, hit, slapped, or otherwise physically hurt by your partner or ex-partner?: No  Within the last year, have you been raped or forced to have any kind of sexual activity by your partner or ex-partner?: No     Constitutional: Pt reports intermittent headaches. Denies fever, malaise, fatigue, or abrupt weight changes.  HEENT: Denies eye pain, eye redness, ear pain, ringing in the ears, wax buildup, runny nose, nasal congestion, bloody nose, or sore throat. Respiratory: Denies difficulty breathing, shortness of breath, cough or sputum production.   Cardiovascular: Denies chest pain, chest tightness, palpitations or swelling in the hands or feet.  Gastrointestinal: Denies abdominal pain, bloating, constipation, diarrhea or blood in the stool.  GU: Denies urgency, frequency, pain with urination, burning sensation, blood in urine, odor or discharge. Musculoskeletal: Pt reports joint pain in hands. Denies decrease in range of motion, difficulty with gait, muscle pain or joint swelling.  Skin: Denies redness, rashes, lesions or ulcercations.  Neurological: Denies dizziness, difficulty with memory, difficulty with speech or problems with balance and coordination.  Psych: Pt has a history of depression. Denies anxiety, SI/HI.  No other specific complaints in a complete review of systems (except as listed in HPI above).  Observations/Objective:  Wt Readings from Last 3 Encounters:  10/30/22 103 lb 9.6 oz (47 kg)  10/28/22 104 lb 4 oz (47.3 kg)  10/23/22 104 lb (47.2 kg)    General: Appears her  stated age, well developed, well nourished in NAD. Pulmonary/Chest: Normal effort. No respiratory distress.  Neurological: Alert and oriented. Coordination normal.  Psychiatric: Mood and affect normal. Behavior is normal. Judgment and thought content normal.    BMET    Component Value Date/Time   NA 139 02/19/2022 1349   K 3.8 02/19/2022 1349   CL 102 02/19/2022 1349   CO2 27 02/19/2022 1349   GLUCOSE 76 02/19/2022 1349   BUN 8 02/19/2022 1349   CREATININE 0.68 02/19/2022 1349   CALCIUM 9.9 02/19/2022 1349   GFRNONAA >60 07/10/2018 0942   GFRAA >60 07/10/2018 0942    Lipid Panel     Component Value Date/Time   CHOL 170 02/19/2022 1349   TRIG 42 02/19/2022 1349   HDL 64 02/19/2022 1349   CHOLHDL 2.7 02/19/2022 1349   VLDL 6.8 02/04/2021 1116   LDLCALC 94 02/19/2022 1349    CBC    Component Value Date/Time   WBC 5.7 10/28/2022 0843   RBC 3.77 (L) 10/28/2022 0843   HGB 11.4 (L) 10/28/2022 0843   HCT 34.9 (L) 10/28/2022 0843   PLT 179 10/28/2022 0843   MCV 92.6 10/28/2022 0843   MCH 30.2 10/28/2022 0843   MCHC 32.7 10/28/2022 0843   RDW 12.1 10/28/2022 0843   LYMPHSABS 1.7 07/10/2018 0942   MONOABS 0.4 07/10/2018 0942   EOSABS 0.1 07/10/2018 0942   BASOSABS 0.0 07/10/2018 0942    Hgb A1C Lab Results  Component Value Date   HGBA1C 5.5 02/19/2022       Assessment and Plan:  Encounter for form completion with patient:  TB form completed, scanned and emailed back to patient  RTC in 6 months for your annual exam  Follow Up Instructions:    I discussed the assessment and treatment plan with the patient. The patient was provided an opportunity to ask questions and all were answered. The patient agreed with the plan and demonstrated an understanding of the instructions.   The patient was advised to call back or seek an in-person evaluation if the symptoms worsen or if the condition fails to improve as anticipated.   Angeline Laura,  NP

## 2024-07-28 ENCOUNTER — Ambulatory Visit: Payer: Self-pay

## 2024-10-17 ENCOUNTER — Encounter: Payer: Self-pay | Admitting: Internal Medicine

## 2024-10-23 NOTE — Progress Notes (Unsigned)
 Subjective:    Patient ID: Kristen Ball, female    DOB: 1991/10/13, 33 y.o.   MRN: [id number]  HPI  Patient presents to clinic today for follow-up of chronic conditions.  Migraines: These occur rarely.  Triggered by stress.  She is no longer taking topirimate.  She does not follow with neurology.  Asthma: Mild, intermittent. She denies chronic cough or shortness of breath. She is not using any inhalers at this time.  There are no PFTs on file.  Depression: Chronic, but she is not currently taking any medications for this.  She has failed mirtazapine , sertraline  and venlafaxine  in the past.  She is not currently seeing a therapist.  She denies anxiety, SI/HI.  Genital herpes: She denies recent outbreak on suppressive valacyclovir.  Chronic hand pain: Bilateral.  She is no longer taking celecoxib  or gabapentin  as prescribed.  She does not follow with orthopedics.  Anemia: Her last H/H was 11.9/34.9, 10/2022. She is not taking any oral iron at this time. She does not follow wit hematology.  Review of Systems  Past Medical History:  Diagnosis Date   Arthritis    right hand   Asthma    resolved as of 10/22/22 per pt   Breast mass 2016   benign   Chicken pox    as a child   COVID-19 11/2020   COVID-19 02/09/2022   Headache & sore throat   Depression    Follows w/ Angeline Laura, NP @ [facility].   Family history of adverse reaction to anesthesia    Mother has N & V with anesthesia.   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   Wears glasses     Current Outpatient Medications  Medication Sig Dispense Refill   valACYclovir (VALTREX) 1000 MG tablet valacyclovir 1 gram tablet  TAKE 1 TABLET BY MOUTH EVERY DAY     No current facility-administered medications for this visit.    Allergies  Allergen Reactions   Metronidazole  Hives, Itching and Rash    Family History  Problem Relation Age of Onset   Arthritis Mother    Hypertension Father    Colon cancer Maternal Grandmother    Diabetes Paternal Grandmother    Hypertension Paternal Grandfather    Breast cancer Maternal Aunt    Migraines Neg Hx     Social History   Socioeconomic History   Marital status: Single    Spouse name: Not on file   Number of children: 0   Years of education: Some coll   Highest education level: Associate degree: occupational, scientist, product/process development, or vocational program  Occupational History   Occupation: Post office  Tobacco Use   Smoking status: Every Day    Types: Cigars   Smokeless tobacco: Never   Tobacco comments:    1 cigar daily  Vaping Use   Vaping status: Never Used  Substance and Sexual Activity   Alcohol use: Yes    Comment: very rarely   Drug use: No   Sexual activity: Yes    Birth control/protection: None  Other Topics Concern   Not on file  Social History Narrative   ** Merged History Encounter **       Work or School: works at post office      Home Situation: lives alone  Spiritual Beliefs: none      Lifestyle: no regular exercising; diet is poor      Right-handed   Rare caffeine use, occasional soda         Social Drivers of Health   Tobacco Use: Low Risk (09/03/2024)   Received from [facility]   Patient History    Smoking Tobacco Use: Never    Smokeless Tobacco Use: Never    Passive Exposure: Not on file  Recent Concern: Tobacco Use - High Risk (07/03/2024)   Patient History    Smoking Tobacco Use: Every Day    Smokeless Tobacco Use: Never    Passive Exposure: Not on file  Financial Resource Strain: High Risk (06/30/2024)   Overall Financial Resource Strain (CARDIA)    Difficulty of Paying Living Expenses: Hard  Food Insecurity: Food Insecurity Present (06/30/2024)   Epic    Worried About Programme Researcher, Broadcasting/film/video in the Last Year: Sometimes true    Ran Out of Food in the Last  Year: Never true  Transportation Needs: No Transportation Needs (06/30/2024)   Epic    Lack of Transportation (Medical): No    Lack of Transportation (Non-Medical): No  Physical Activity: Sufficiently Active (06/30/2024)   Exercise Vital Sign    Days of Exercise per Week: 5 days    Minutes of Exercise per Session: 60 min  Stress: Stress Concern Present (06/30/2024)   [employer] of Occupational Health - Occupational Stress Questionnaire    Feeling of Stress: Very much  Social Connections: Moderately Isolated (06/30/2024)   Social Connection and Isolation Panel    Frequency of Communication with Friends and Family: More than three times a week    Frequency of Social Gatherings with Friends and Family: Once a week    Attends Religious Services: More than 4 times per year    Active Member of [employer] or Organizations: No    Attends Banker Meetings: Not on file    Marital Status: Never married  Intimate Partner Violence: Not At Risk (05/18/2024)   Received from [facility]   Epic    Within the last year, have you been afraid of your partner or ex-partner?: No    Within the last year, have you been humiliated or emotionally abused in other ways by your partner or ex-partner?: No    Within the last year, have you been kicked, hit, slapped, or otherwise physically hurt by your partner or ex-partner?: No    Within the last year, have you been raped or forced to have any kind of sexual activity by your partner or ex-partner?: No  Depression (PHQ2-9): Medium Risk (07/03/2024)   Depression (PHQ2-9)    PHQ-2 Score: 6  Alcohol Screen: Low Risk (06/30/2024)   Alcohol Screen    Last Alcohol Screening Score (AUDIT): 1  Housing: High Risk (06/30/2024)   Epic    Unable to Pay for Housing in the Last Year: Yes    Number of Times Moved in the Last Year: 0    Homeless in the Last Year: No  Utilities: Not At Risk (05/18/2024)   Received from [facility]   Epic    In the past 12  months has the electric, gas, oil, or water  company threatened to shut off services in your home?: No  Health Literacy: Not on file     Constitutional: Patient reports intermittent headaches.  Denies fever, malaise, fatigue, or abrupt weight changes.  HEENT: Denies eye pain, eye redness, ear pain, ringing  in the ears, wax buildup, runny nose, nasal congestion, bloody nose, or sore throat. Respiratory: Denies difficulty breathing, shortness of breath, cough or sputum production.   Cardiovascular: Denies chest pain, chest tightness, palpitations or swelling in the hands or feet.  Gastrointestinal: Denies abdominal pain, bloating, constipation, diarrhea or blood in the stool.  GU: Denies urgency, frequency, pain with urination, burning sensation, blood in urine, odor or discharge. Musculoskeletal: Patient reports bilateral hand pain.  Denies decrease in range of motion, difficulty with gait, muscle pain or joint swelling.  Skin: Denies redness, rashes, lesions or ulcercations.  Neurological: Denies dizziness, difficulty with memory, difficulty with speech or problems with balance and coordination.  Psych: Patient has a history of depression.  Denies anxiety, SI/HI.  No other specific complaints in a complete review of systems (except as listed in HPI above).     Objective:   Physical Exam   There were no vitals taken for this visit. Wt Readings from Last 3 Encounters:  10/30/22 103 lb 9.6 oz (47 kg)  10/28/22 104 lb 4 oz (47.3 kg)  10/23/22 104 lb (47.2 kg)    General: Appears their stated age, well developed, well nourished in NAD. Skin: Warm, dry and intact. No rashes, lesions or ulcerations noted. HEENT: Head: normal shape and size; Eyes: sclera white, no icterus, conjunctiva pink, PERRLA and EOMs intact; Ears: Tm's gray and intact, normal light reflex; Nose: mucosa pink and moist, septum midline; Throat/Mouth: Teeth present, mucosa pink and moist, no exudate, lesions or ulcerations  noted.  Neck:  Neck supple, trachea midline. No masses, lumps or thyromegaly present.  Cardiovascular: Normal rate and rhythm. S1,S2 noted.  No murmur, rubs or gallops noted. No JVD or BLE edema. No carotid bruits noted. Pulmonary/Chest: Normal effort and positive vesicular breath sounds. No respiratory distress. No wheezes, rales or ronchi noted.  Abdomen: Soft and nontender. Normal bowel sounds. No distention or masses noted. Liver, spleen and kidneys non palpable. Musculoskeletal: Normal range of motion. No signs of joint swelling. No difficulty with gait.  Neurological: Alert and oriented. Cranial nerves II-XII grossly intact. Coordination normal.  Psychiatric: Mood and affect normal. Behavior is normal. Judgment and thought content normal.     BMET    Component Value Date/Time   NA 139 02/19/2022 1349   K 3.8 02/19/2022 1349   CL 102 02/19/2022 1349   CO2 27 02/19/2022 1349   GLUCOSE 76 02/19/2022 1349   BUN 8 02/19/2022 1349   CREATININE 0.68 02/19/2022 1349   CALCIUM 9.9 02/19/2022 1349   GFRNONAA >60 07/10/2018 0942   GFRAA >60 07/10/2018 0942    Lipid Panel     Component Value Date/Time   CHOL 170 02/19/2022 1349   TRIG 42 02/19/2022 1349   HDL 64 02/19/2022 1349   CHOLHDL 2.7 02/19/2022 1349   VLDL 6.8 02/04/2021 1116   LDLCALC 94 02/19/2022 1349    CBC    Component Value Date/Time   WBC 5.7 10/28/2022 0843   RBC 3.77 (L) 10/28/2022 0843   HGB 11.4 (L) 10/28/2022 0843   HCT 34.9 (L) 10/28/2022 0843   PLT 179 10/28/2022 0843   MCV 92.6 10/28/2022 0843   MCH 30.2 10/28/2022 0843   MCHC 32.7 10/28/2022 0843   RDW 12.1 10/28/2022 0843   LYMPHSABS 1.7 07/10/2018 0942   MONOABS 0.4 07/10/2018 0942   EOSABS 0.1 07/10/2018 0942   BASOSABS 0.0 07/10/2018 0942    Hgb A1C Lab Results  Component Value Date   HGBA1C 5.5  02/19/2022           Assessment & Plan:      RTC in 6 months for your annual exam Angeline Laura, NP

## 2024-10-24 ENCOUNTER — Ambulatory Visit: Payer: Self-pay | Admitting: Internal Medicine

## 2024-10-24 VITALS — BP 112/74 | Ht 64.0 in | Wt 112.0 lb

## 2024-10-24 DIAGNOSIS — K257 Chronic gastric ulcer without hemorrhage or perforation: Secondary | ICD-10-CM

## 2024-10-24 DIAGNOSIS — Z136 Encounter for screening for cardiovascular disorders: Secondary | ICD-10-CM

## 2024-10-24 DIAGNOSIS — F329 Major depressive disorder, single episode, unspecified: Secondary | ICD-10-CM

## 2024-10-24 DIAGNOSIS — R739 Hyperglycemia, unspecified: Secondary | ICD-10-CM

## 2024-10-24 DIAGNOSIS — M79642 Pain in left hand: Secondary | ICD-10-CM

## 2024-10-24 DIAGNOSIS — G43C1 Periodic headache syndromes in child or adult, intractable: Secondary | ICD-10-CM

## 2024-10-24 DIAGNOSIS — J4520 Mild intermittent asthma, uncomplicated: Secondary | ICD-10-CM

## 2024-10-24 DIAGNOSIS — M79641 Pain in right hand: Secondary | ICD-10-CM

## 2024-10-24 DIAGNOSIS — D500 Iron deficiency anemia secondary to blood loss (chronic): Secondary | ICD-10-CM

## 2024-10-24 DIAGNOSIS — A6004 Herpesviral vulvovaginitis: Secondary | ICD-10-CM

## 2024-10-24 MED ORDER — PANTOPRAZOLE SODIUM 40 MG PO TBEC: 40.0000 mg | DELAYED_RELEASE_TABLET | Freq: Every day | ORAL | 1 refills | Status: AC

## 2024-10-24 MED ORDER — CELECOXIB 200 MG PO CAPS: 200.0000 mg | ORAL_CAPSULE | Freq: Every day | ORAL | 1 refills | Status: AC

## 2024-10-24 NOTE — Assessment & Plan Note (Signed)
 CBC and iron panel today Not currently taking any oral iron

## 2024-10-24 NOTE — Assessment & Plan Note (Signed)
 Continue valcyclovir 1000 mg daily prn for outbreaks

## 2024-10-24 NOTE — Patient Instructions (Signed)
 Stomach Ulcer (Peptic Ulcer): Eating Plan A stomach ulcer is also called a peptic ulcer. A gastric ulcer is a sore in the lining of the stomach. A duodenal ulcer is a sore in the first part of the small intestine. When ulcers develop, they can cause a burning feeling in the stomach. They may also cause bloating, a feeling like you may throw up, throwing up, and poor appetite. If you have had a stomach ulcer, you should keep track of what foods and drinks cause symptoms. What are tips for following this plan?  Eat a healthy, well-balanced diet. This includes: Fresh fruits and vegetables. Eat a variety of colors of fruits and vegetables. Whole grains. Try to make sure at least half of the grains you eat each day are whole grains. Low-fat dairy. Lean meat, fish, poultry, eggs, beans, and nuts. Healthy fats, such as olive oil, grapeseed oil, or canola oil. Try to eat less than 8 teaspoons of fats and oils each day. Avoid foods that cause irritation or pain. These may be different for each person. Keep a food journal to identify foods that cause symptoms. Avoid processed foods that have added salt and sugar. Avoid drinking alcohol. Avoid drinks with caffeine , such as cola, black tea, energy drinks, and coffee. Recommended foods Fruits All fresh, frozen, or dried fruit. Fruit canned in juice. Vegetables All fresh or frozen vegetables. Low-salt (low-sodium) canned vegetables. Grains Whole grains. Meats and other protein foods Lean cuts of meat. Skinless poultry. Fresh or canned fish. Eggs. Tofu. Nuts and nut butter. Dried beans. Low-sodium canned beans. Dairy Low-fat or nonfat (skim) milk. Nonfat or low-fat yogurt. Nonfat or low-fat cheese. Beverages Water. Soy or nut milks. Caffeine -free soft drinks. Herbal tea. Fats and oils Olive oil. Canola oil. Grapeseed oil. Sunflower oil. Seasoning and other foods Low-fat salad dressing. Ketchup. Low-fat mayonnaise. All spices except pepper.  Low-sodium seasoning mixes. The items listed above may not be all the foods and drinks you can have. Talk with an expert in healthy eating called a dietitian to learn more. Foods to avoid Meats and other protein foods Fatty meats. Fried meats. Any meat that causes symptoms. Dairy Whole milk. Ice cream. Cream. Chocolate milk. Beverages Alcohol. Coffee. Cola and energy drinks. Black or green tea. Cocoa. Fats and oils Butter. Lard. Ghee. Seasoning and other foods Pepper. Hot sauce. Any seasonings or condiments that cause symptoms. The items listed above may not be all the foods and drinks you should avoid. Talk with a dietitian to learn more. This information is not intended to replace advice given to you by your health care provider. Make sure you discuss any questions you have with your health care provider. Document Revised: 01/10/2024 Document Reviewed: 01/10/2024 Elsevier Patient Education  2025 [employer].

## 2024-10-24 NOTE — Assessment & Plan Note (Signed)
Currently not an issue Will monitor 

## 2024-10-24 NOTE — Assessment & Plan Note (Signed)
 Migraines are not as severe but she is having daily headaches Encouraged stress reduction techniques Not medicated at this time and she does not want to restart the topiramate  at this time Will monitor

## 2024-10-24 NOTE — Assessment & Plan Note (Signed)
 Persistent Not medicated  She plans to start therapy again soon Support offered

## 2024-10-24 NOTE — Assessment & Plan Note (Signed)
 Will restart celecoxib  200 mg daily Will monitor

## 2024-10-24 NOTE — Assessment & Plan Note (Signed)
 Will restart pantoprazole  40 mg daily since we are restarting her on celecoxib  200 mg daily She reports she is due for repeat upper endoscopy Avoid NSAIDs OTC

## 2024-10-25 ENCOUNTER — Ambulatory Visit: Payer: Self-pay | Admitting: Internal Medicine

## 2024-10-25 LAB — LIPID PANEL
Cholesterol: 163 mg/dL (ref <200)
HDL: 64 mg/dL (ref >=50)
LDL Cholesterol (Calc): 88 mg/dL
Non-HDL Cholesterol (Calc): 99 mg/dL (ref <130)
Total CHOL/HDL Ratio: 2.5 (calc) (ref <5.0)
Triglycerides: 38 mg/dL (ref <150)

## 2024-10-25 LAB — IRON,TIBC AND FERRITIN PANEL
%SAT: 21 % (ref 16–45)
Ferritin: 133 ng/mL (ref 16–154)
Iron: 58 ug/dL (ref 40–190)
TIBC: 277 ug/dL (ref 250–450)

## 2024-10-25 LAB — COMPREHENSIVE METABOLIC PANEL WITH GFR
AG Ratio: 1.7 (calc) (ref 1.0–2.5)
ALT: 12 U/L (ref 6–29)
AST: 16 U/L (ref 10–30)
Albumin: 4.7 g/dL (ref 3.6–5.1)
Alkaline phosphatase (APISO): 62 U/L (ref 31–125)
BUN: 10 mg/dL (ref 7–25)
CO2: 30 mmol/L (ref 20–32)
Calcium: 9.5 mg/dL (ref 8.6–10.2)
Chloride: 102 mmol/L (ref 98–110)
Creat: 0.62 mg/dL (ref 0.50–0.97)
Globulin: 2.8 g/dL (ref 1.9–3.7)
Glucose, Bld: 99 mg/dL (ref 65–99)
Potassium: 3.8 mmol/L (ref 3.5–5.3)
Sodium: 139 mmol/L (ref 135–146)
Total Bilirubin: 0.7 mg/dL (ref 0.2–1.2)
Total Protein: 7.5 g/dL (ref 6.1–8.1)
eGFR: 120 mL/min/1.73m2 (ref >=60)

## 2024-10-25 LAB — CBC
HCT: 37.6 % (ref 35.9–46.0)
Hemoglobin: 12.3 g/dL (ref 11.7–15.5)
MCH: 30 pg (ref 27.0–33.0)
MCHC: 32.7 g/dL (ref 31.6–35.4)
MCV: 91.7 fL (ref 81.4–101.7)
MPV: 10.5 fL (ref 7.5–12.5)
Platelets: 268 Thousand/uL (ref 140–400)
RBC: 4.1 Million/uL (ref 3.80–5.10)
RDW: 12.5 % (ref 11.0–15.0)
WBC: 6.1 Thousand/uL (ref 3.8–10.8)

## 2024-10-25 LAB — HEMOGLOBIN A1C
Hgb A1c MFr Bld: 5.3 % (ref <5.7)
Mean Plasma Glucose: 105 mg/dL
eAG (mmol/L): 5.8 mmol/L

## 2025-04-24 ENCOUNTER — Encounter: Admitting: Internal Medicine
